# Patient Record
Sex: Female | Born: 1990 | Race: White | Hispanic: No | Marital: Single | Smoking: Current every day smoker
Health system: Southern US, Community
[De-identification: ages and names within clinical notes are randomized; demographics above are authoritative.]

## PROBLEM LIST (undated history)

## (undated) ENCOUNTER — Inpatient Hospital Stay (HOSPITAL_COMMUNITY): Payer: Self-pay

## (undated) DIAGNOSIS — F1910 Other psychoactive substance abuse, uncomplicated: Secondary | ICD-10-CM

## (undated) DIAGNOSIS — G5600 Carpal tunnel syndrome, unspecified upper limb: Secondary | ICD-10-CM

## (undated) DIAGNOSIS — M1990 Unspecified osteoarthritis, unspecified site: Secondary | ICD-10-CM

## (undated) DIAGNOSIS — F419 Anxiety disorder, unspecified: Secondary | ICD-10-CM

## (undated) DIAGNOSIS — R51 Headache: Secondary | ICD-10-CM

## (undated) HISTORY — DX: Carpal tunnel syndrome, unspecified upper limb: G56.00

---
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
## 2010-02-06 ENCOUNTER — Emergency Department (HOSPITAL_COMMUNITY): Admission: EM | Admit: 2010-02-06 | Discharge: 2010-02-06 | Payer: Self-pay | Admitting: Emergency Medicine

## 2010-06-02 ENCOUNTER — Emergency Department (HOSPITAL_COMMUNITY): Admission: EM | Admit: 2010-06-02 | Discharge: 2009-07-19 | Payer: Self-pay | Admitting: Emergency Medicine

## 2011-11-22 ENCOUNTER — Encounter (HOSPITAL_COMMUNITY): Payer: Self-pay

## 2011-11-22 ENCOUNTER — Inpatient Hospital Stay (HOSPITAL_COMMUNITY)
Admission: AD | Admit: 2011-11-22 | Discharge: 2011-11-22 | Disposition: A | Discharge status: Home / Self Care | Payer: Medicaid Other | Source: Ambulatory Visit | Attending: Obstetrics and Gynecology | Admitting: Obstetrics and Gynecology

## 2011-11-22 DIAGNOSIS — Z331 Pregnant state, incidental: Secondary | ICD-10-CM

## 2011-11-22 DIAGNOSIS — O99891 Other specified diseases and conditions complicating pregnancy: Secondary | ICD-10-CM | POA: Insufficient documentation

## 2011-11-22 HISTORY — DX: Headache: R51
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
## 2011-11-22 NOTE — MAU Note (Signed)
Patient states she has had no prenatal care pending Medicaid approval. Plans to go to [facility] for care. Has had some lower right abdominal pain that radiates into the back but none now. Denies any bleeding or discharge. Wants to make sure everything is OK.

## 2011-11-22 NOTE — MAU Provider Note (Signed)
  History     CSN: [id number]  Arrival date and time: 11/22/11 1429   First Provider Initiated Contact with Patient 11/22/11 1813      No chief complaint on file.  HPI21 yr female g1 P 0 at 78. 0 wks with recently acquired medicaid approval, awaiting card, desires care thru [facility].  Has not contacted their office. No c/o  But just wants to hear ht beat, know theres no problem.  Pertinent Gynecological History: Menses: regular every month with spotting approximately 4-5 days days per month Bleeding: none Contraception: none DES exposure: unknown Blood transfusions: none Sexually transmitted diseases: no past history Previous GYN Procedures: none, never had  a gyn exam or pap  Last mammogram: n/a Date:  Last pap:  Not done yet   Past Medical History  Diagnosis Date  . Headache     Past Surgical History  Procedure Date  . Foot surgery   . Wisdom tooth extraction     Family History  Problem Relation Age of Onset  . Anesthesia problems Neg Hx   . Hypotension Neg Hx   . Malignant hyperthermia Neg Hx   . Pseudochol deficiency Neg Hx     History  Substance Use Topics  . Smoking status: Current Everyday Smoker -- 1.0 packs/day for 4 years    Types: Cigarettes  . Smokeless tobacco: Never Used  . Alcohol Use: No    Allergies: No Known Allergies  Prescriptions prior to admission  Medication Sig Dispense Refill  . Prenatal Vit-Fe Fumarate-FA (PRENATAL MULTIVITAMIN) TABS Take 1 tablet by mouth every morning.        ROS Physical Exam   Blood pressure 116/67, pulse 87, temperature 98.3 F (36.8 C), temperature source Oral, resp. rate 16, height 5' 1.5" (1.562 m), weight 73.573 kg (162 lb 3.2 oz), last menstrual period 08/02/2011, SpO2 100.00%.  Physical Exam Physical Examination: General appearance - alert, well appearing, and in no distress Mental status - alert, oriented to person, place, and time Neck - supple, no significant adenopathy Chest - clear  to auscultation, no wheezes, rales or rhonchi, symmetric air entry, no tachypnea, retractions or cyanosis Heart - normal rate, regular rhythm, normal S1, S2, no murmurs, rubs, clicks or gallops, normal rate and regular rhythm Abdomen - soft, nontender, nondistended, no masses or organomegaly Gravid uterus at u-3 cm, Fht 150 regular Extremities - no pedal edema noted, Homan's sign negative bilaterally  MAU Course  Procedures  MDM minimal  Assessment and Plan  Pregnancy 16 wks,  FHt confirmed  Plan:  Referred to [facility] for initiation of prenatal care,  Pt aware that in the absence of any acute concerns, ultrasound deferred til seen by MD of choice.  Jullien Granquist V 11/22/2011, 6:21 PM

## 2011-11-22 NOTE — MAU Note (Signed)
Pt states has had h/a X1.5 hours, no food since yesterday evening. Denies abnormal vaginal d/c changes or bleeding, G1.

## 2011-12-01 ENCOUNTER — Other Ambulatory Visit: Payer: Self-pay | Admitting: Obstetrics

## 2011-12-01 DIAGNOSIS — Z3689 Encounter for other specified antenatal screening: Secondary | ICD-10-CM

## 2011-12-15 ENCOUNTER — Ambulatory Visit (HOSPITAL_COMMUNITY)
Admission: RE | Admit: 2011-12-15 | Discharge: 2011-12-15 | Disposition: A | Discharge status: Home / Self Care | Payer: Medicaid Other | Source: Ambulatory Visit | Attending: Obstetrics | Admitting: Obstetrics

## 2011-12-15 DIAGNOSIS — O358XX0 Maternal care for other (suspected) fetal abnormality and damage, not applicable or unspecified: Secondary | ICD-10-CM | POA: Insufficient documentation

## 2011-12-15 DIAGNOSIS — Z3689 Encounter for other specified antenatal screening: Secondary | ICD-10-CM

## 2011-12-15 DIAGNOSIS — Z363 Encounter for antenatal screening for malformations: Secondary | ICD-10-CM | POA: Insufficient documentation

## 2011-12-15 DIAGNOSIS — Z1389 Encounter for screening for other disorder: Secondary | ICD-10-CM | POA: Insufficient documentation

## 2011-12-15 DIAGNOSIS — O99330 Smoking (tobacco) complicating pregnancy, unspecified trimester: Secondary | ICD-10-CM | POA: Insufficient documentation

## 2011-12-15 IMAGING — US US OB DETAIL+14 WK
2 series · 16 of 28 positions shown · non-contrast
Comparison: none

[Series 1: us ob detail +14 wk · 2 of 12 slices shown (1 of 2)]
[im 1/12]
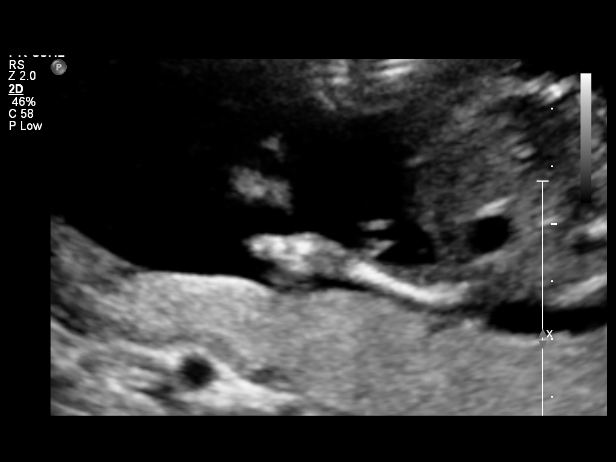
[im 8/12]
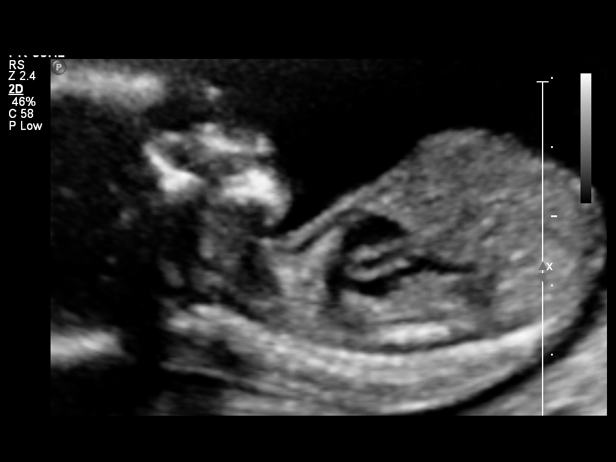

[Series 1: us ob detail +14 wk · 14 of 76 slices shown (2 of 2)]
[im 1/76]
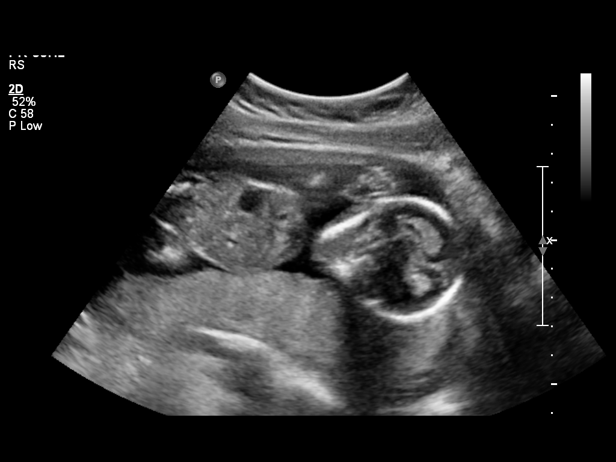
[im 7/76]
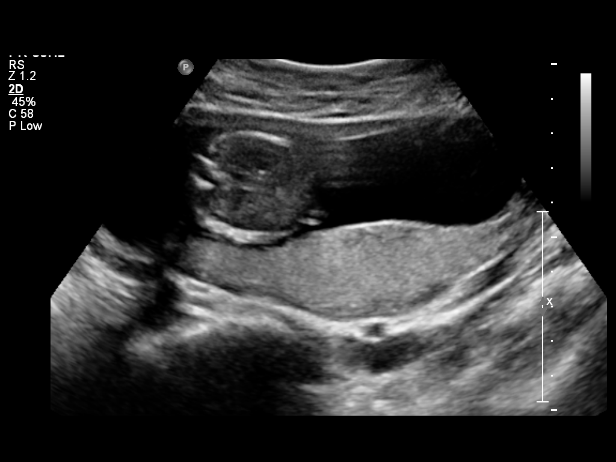
[im 10/76]
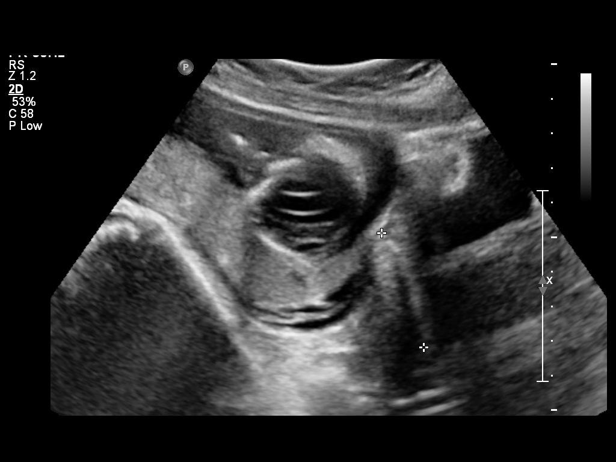
[im 17/76]
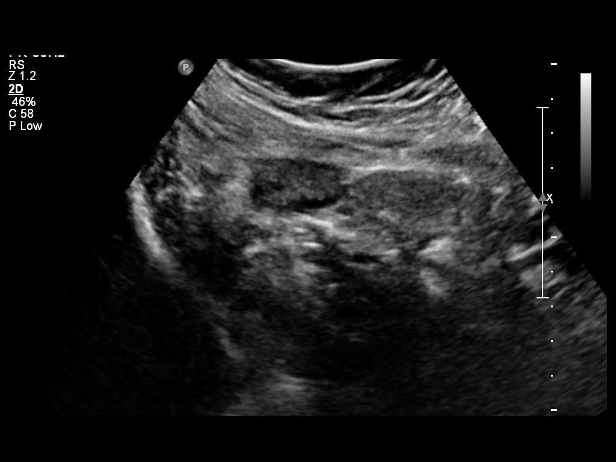
[im 23/76]
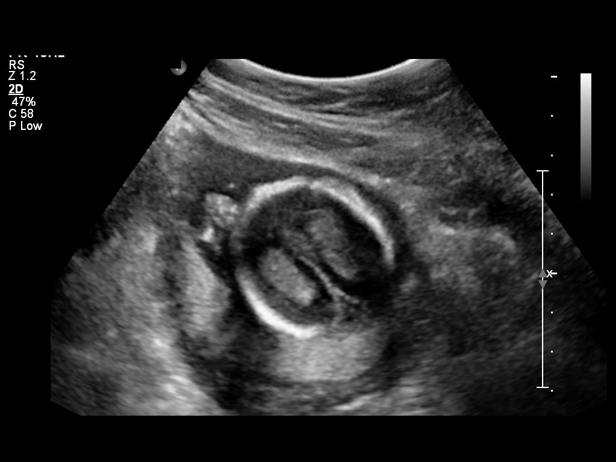
[im 30/76]
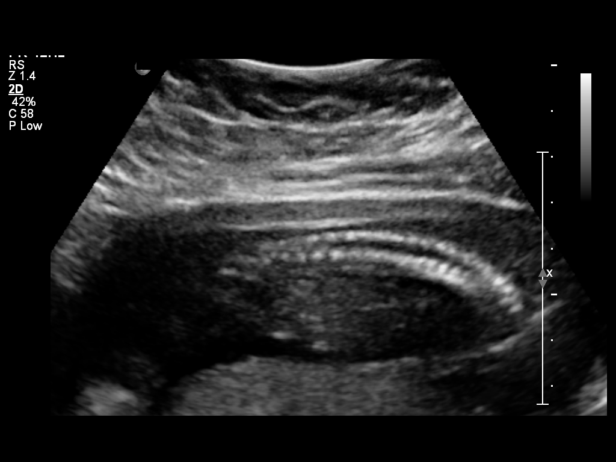
[im 33/76]
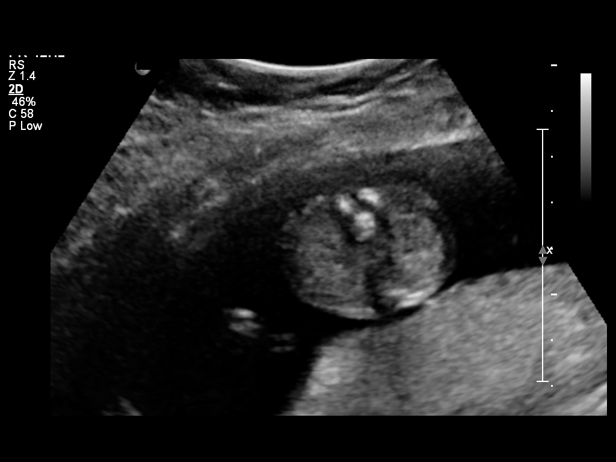
[im 40/76]
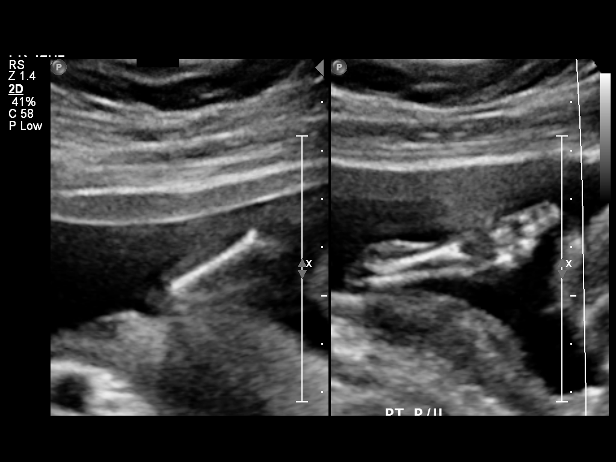
[im 46/76]
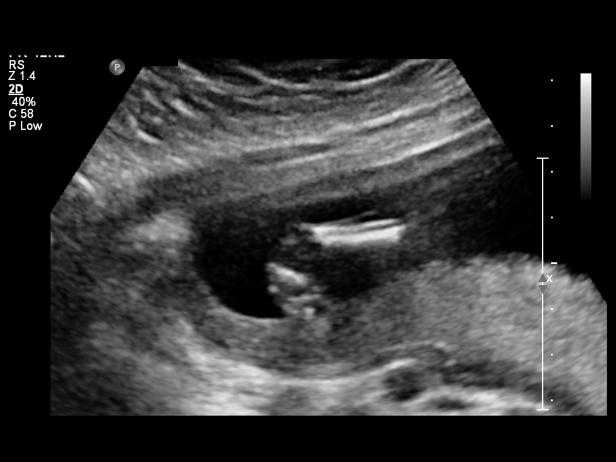
[im 53/76]
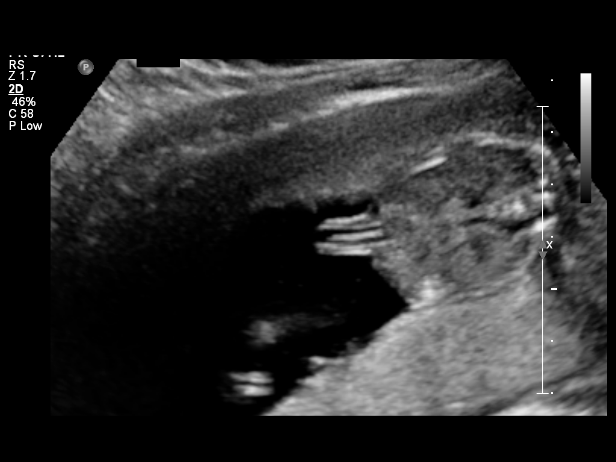
[im 56/76]
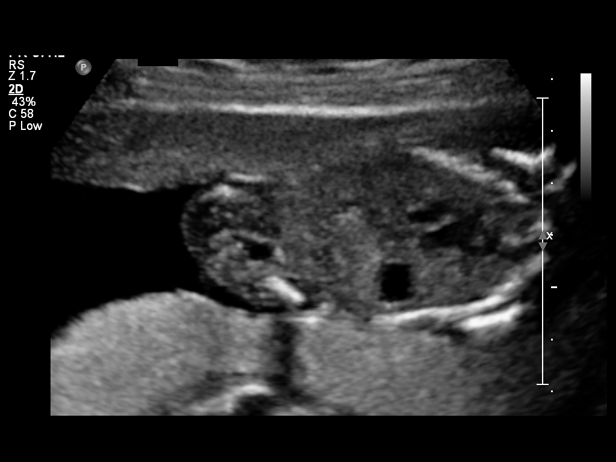
[im 62/76]
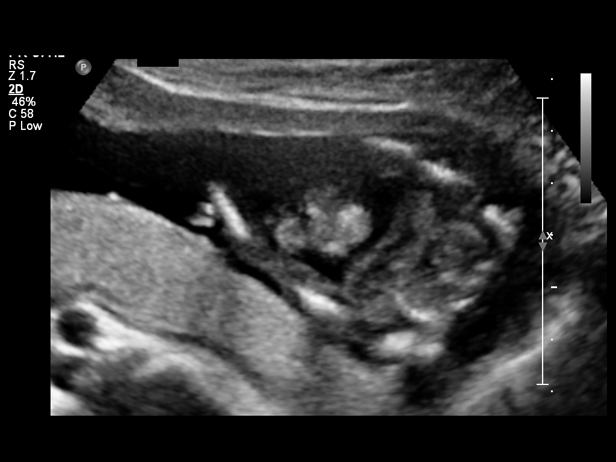
[im 69/76]
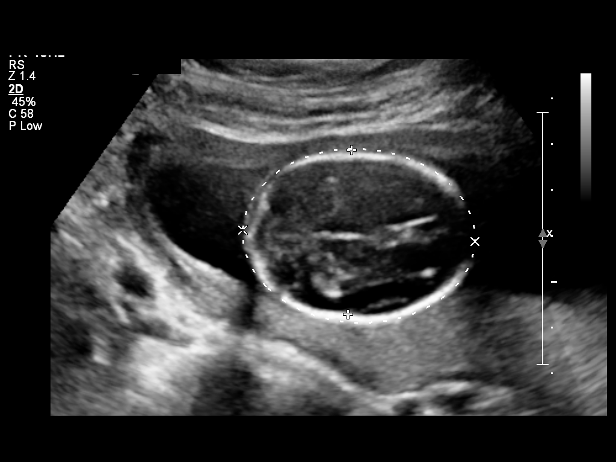
[im 76/76]
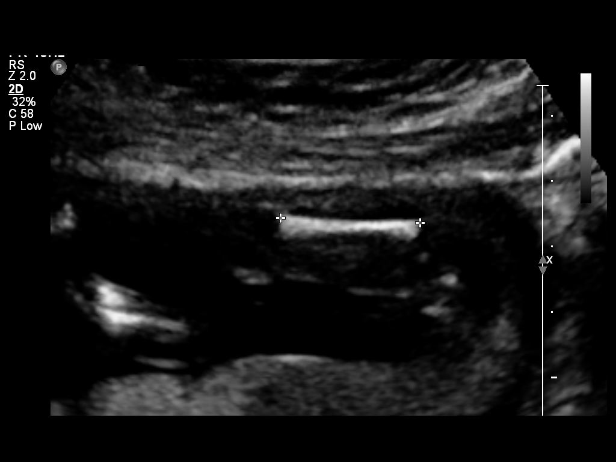

[16 of 28 positions shown; findings below may reference images not displayed]

OBSTETRICS REPORT
                      (Signed Final [DATE] [DATE])

 Order#:         [PHONE_NUMBER]_O
Procedures

 US OB DETAIL + 14 WK                                  76811.0
Indications

 Detailed fetal anatomic survey                        655.83 [9L]
 Cigarette smoker
Fetal Evaluation

 Fetal Heart Rate:  153                         bpm
 Cardiac Activity:  Observed
 Presentation:      Cephalic
 Placenta:          Posterior Previa
 P. Cord            Visualized
 Insertion:

 Amniotic Fluid
 AFI FV:      Subjectively within normal limits
                                             Larg Pckt:       4  cm
Biometry

 BPD:     36.4  mm    G. Age:   17w 1d                CI:        68.07   70 - 86
                                                      FL/HC:      15.0   14.6 -

 HC:     141.1  mm    G. Age:   17w 3d       55  %    HC/AC:      1.19   1.07 -

 AC:     118.6  mm    G. Age:   17w 4d       64  %    FL/BPD:
 FL:      21.2  mm    G. Age:   16w 3d       18  %    FL/AC:      17.9   20 - 24
 HUM:     21.1  mm    G. Age:   16w 2d       35  %
 CER:     16.4  mm    G. Age:   16w 3d       33  %
 NFT:     3.83  mm

 Est. FW:     178  gm      0 lb 6 oz     52  %
Gestational Age

 LMP:           19w 2d       Date:   [DATE]                 EDD:   [DATE]
 U/S Today:     17w 1d                                        EDD:   [DATE]
 Best:          17w 1d    Det. By:   U/S ([DATE])           EDD:   [DATE]
Anatomy

 Cranium:           Appears normal      Aortic Arch:       Appears normal
 Fetal Cavum:       Appears normal      Ductal Arch:       Appears normal
 Ventricles:        Appears normal      Diaphragm:         Appears normal
 Choroid Plexus:    Appears normal      Stomach:           Appears
                                                           normal, left
                                                           sided
 Cerebellum:        Appears normal      Abdomen:           Appears normal
 Posterior Fossa:   Appears normal      Abdominal Wall:    Appears nml
                                                           (cord insert,
                                                           abd wall)
 Nuchal Fold:       Appears normal      Cord Vessels:      Appears normal
                    (neck, nuchal                          (3 vessel cord)
                    fold)
 Face:              Unilateral right    Kidneys:           Appear normal
                    cleft lip with
                    probable palate
 Heart:             Appears normal      Bladder:           Appears normal
                    (4 chamber &
                    axis)
 RVOT:              Appears normal      Spine:             Limited views
                                                           appear normal
 LVOT:              Appears normal      Limbs:             Bilateral
                                                           clubbed feet

 Other:     Fetus appears to be a male. Left 5th digit visualized.
Targeted Anatomy

 Fetal Central Nervous System
 Lat. Ventricles:   6.6                 Cisterna Magna:
Cervix Uterus Adnexa

 Cervix:       Normal appearance by transabdominal scan.
 Left Ovary:   Within normal limits.
 Right Ovary:  Within normal limits.
 Adnexa:     No abnormality visualized.
Comments

 Because of today's findings, this report was called to the
 office.
Impression

 Siup demonstrating an EGA by ultrasound of 17w 1d. This is
 2w 1d behind expected EGA by provided LMP and suggests
 best dating by today's exam. Follow up to assess for
 appropriate linear growth is recommended in 3 weeks  to
 exclude early growth delay given this discrepancy and the
 anatomic findings.

 A right unilateral cleft lip is seen. The maxillary ridge appears
 disrupted suggesting associated involvement of the palate.
 Bilateral club foot deformity is seen. The remainder of the
 fetal anatomy appears normal. While both of these findings
 may occur sporadically, the presence of these two findings
 together and the possible delayed growth may indicate an
 underlying syndromic process and further assessment with
 genetic counseling and consideration for aneuploidy testing
 and fetal echo is recommended.  This can be performed at
 the Maternal Fetal Medicine department at [HOSPITAL].

 No focal placental abnormality is noted.

 Subjectively and quantitatively normal amniotic fluid volume.
 Normal cervical length.
Recommendations

 Further assessment with genetic counseling and
 consideration for aneuploidy testing.

 questions or concerns.

## 2011-12-18 ENCOUNTER — Other Ambulatory Visit: Payer: Self-pay | Admitting: Obstetrics

## 2011-12-18 DIAGNOSIS — R9389 Abnormal findings on diagnostic imaging of other specified body structures: Secondary | ICD-10-CM

## 2011-12-18 DIAGNOSIS — Q6601 Congenital talipes equinovarus, right foot: Secondary | ICD-10-CM

## 2011-12-20 ENCOUNTER — Ambulatory Visit (HOSPITAL_COMMUNITY): Payer: Medicaid Other | Attending: Obstetrics

## 2011-12-20 ENCOUNTER — Ambulatory Visit (HOSPITAL_COMMUNITY): Admission: RE | Admit: 2011-12-20 | Payer: Medicaid Other | Source: Ambulatory Visit

## 2011-12-22 ENCOUNTER — Other Ambulatory Visit: Payer: Self-pay | Admitting: Obstetrics

## 2011-12-22 ENCOUNTER — Ambulatory Visit (HOSPITAL_COMMUNITY)
Admission: RE | Admit: 2011-12-22 | Discharge: 2011-12-22 | Disposition: A | Discharge status: Home / Self Care | Payer: Medicaid Other | Source: Ambulatory Visit | Attending: Obstetrics | Admitting: Obstetrics

## 2011-12-22 VITALS — BP 106/62 | HR 83 | Wt 171.0 lb

## 2011-12-22 DIAGNOSIS — Q6601 Congenital talipes equinovarus, right foot: Secondary | ICD-10-CM

## 2011-12-22 DIAGNOSIS — Q6602 Congenital talipes equinovarus, left foot: Secondary | ICD-10-CM

## 2011-12-22 DIAGNOSIS — Q369 Cleft lip, unilateral: Secondary | ICD-10-CM

## 2011-12-22 DIAGNOSIS — Z3689 Encounter for other specified antenatal screening: Secondary | ICD-10-CM | POA: Insufficient documentation

## 2011-12-22 DIAGNOSIS — O26849 Uterine size-date discrepancy, unspecified trimester: Secondary | ICD-10-CM | POA: Insufficient documentation

## 2011-12-22 DIAGNOSIS — O358XX0 Maternal care for other (suspected) fetal abnormality and damage, not applicable or unspecified: Secondary | ICD-10-CM | POA: Insufficient documentation

## 2011-12-22 DIAGNOSIS — Q6689 Other  specified congenital deformities of feet: Secondary | ICD-10-CM

## 2011-12-22 IMAGING — US US OB FOLLOW-UP
1 series · 12 of 28 positions shown · non-contrast
Comparison: none

[Series 1: us ob follow-up · 0.19mm/px · 12 of 98 slices shown]
[im 4/98]
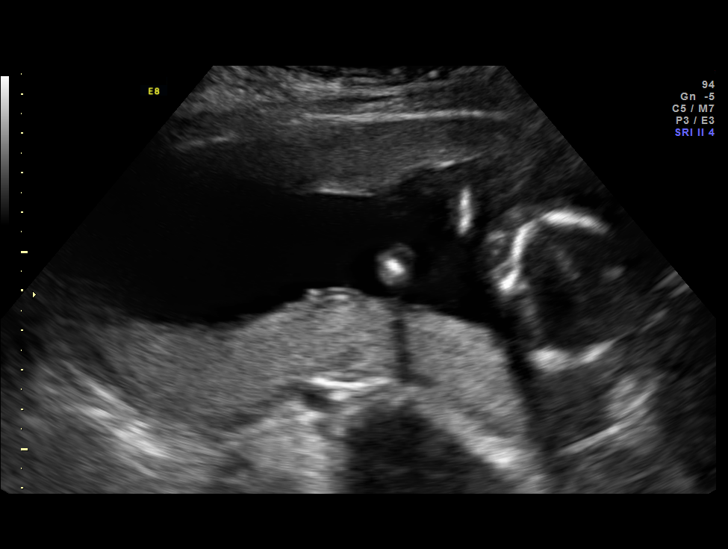
[im 11/98]
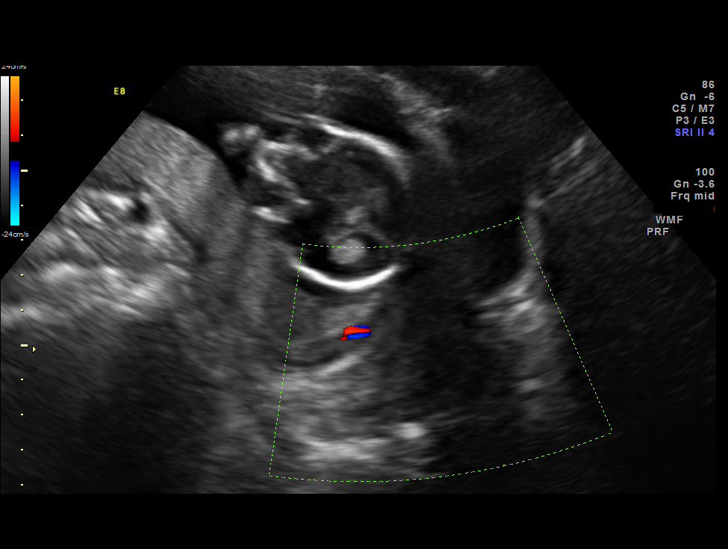
[im 18/98]
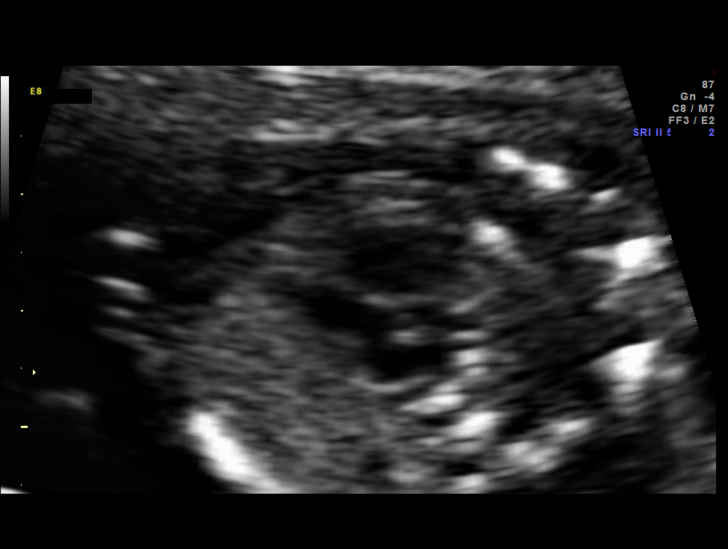
[im 29/98]
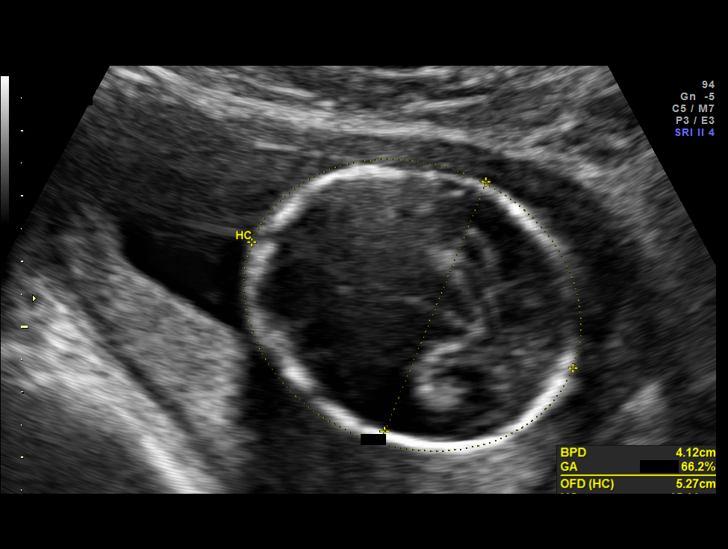
[im 36/98]
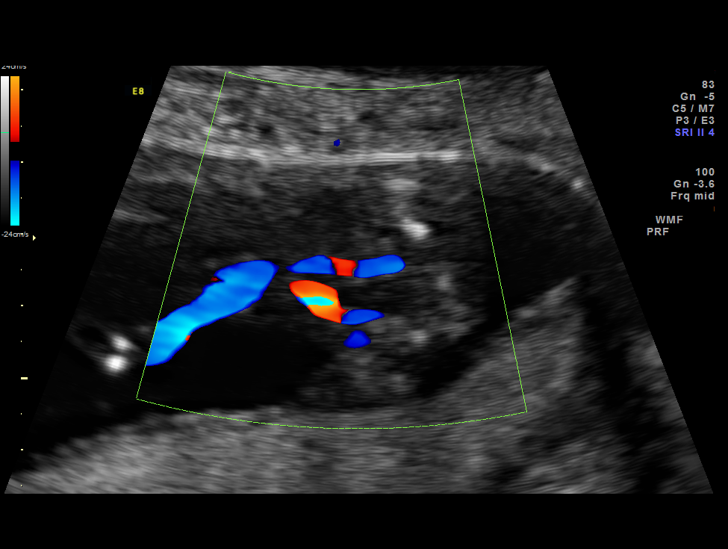
[im 44/98]
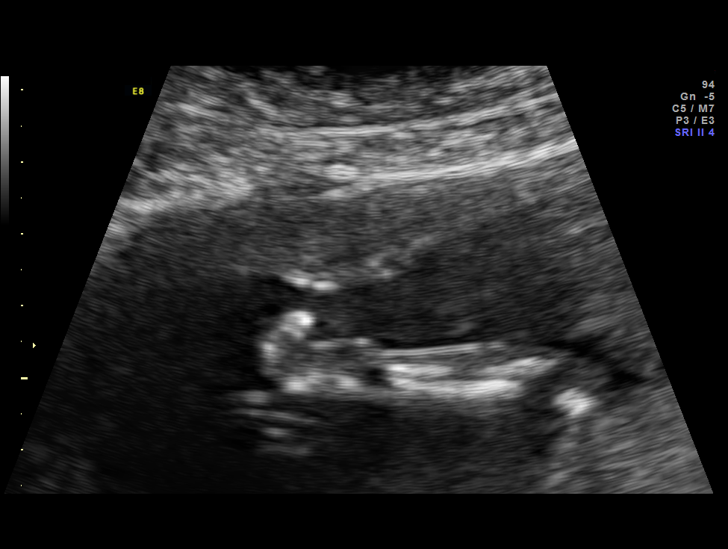
[im 54/98]
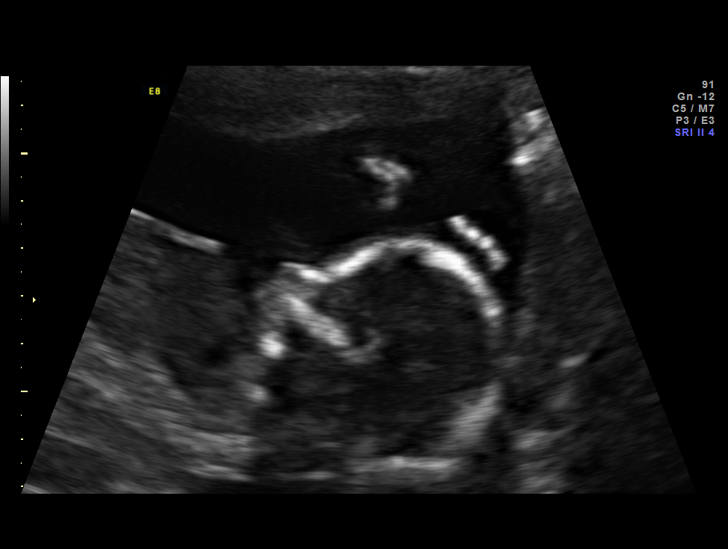
[im 62/98]
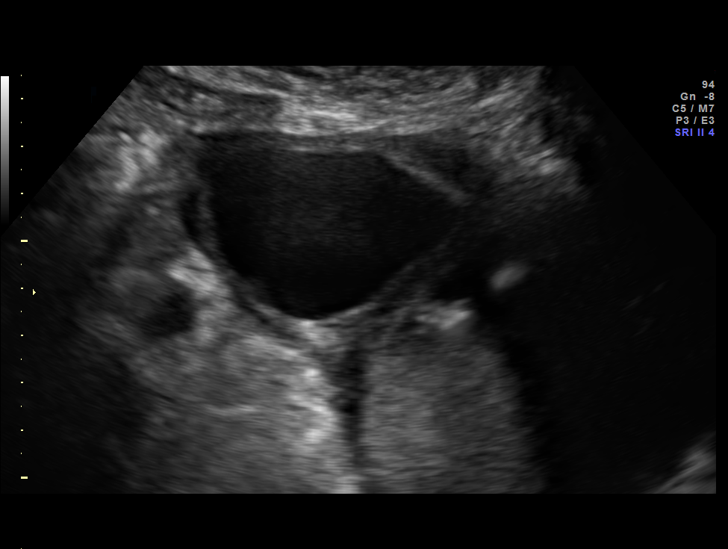
[im 69/98]
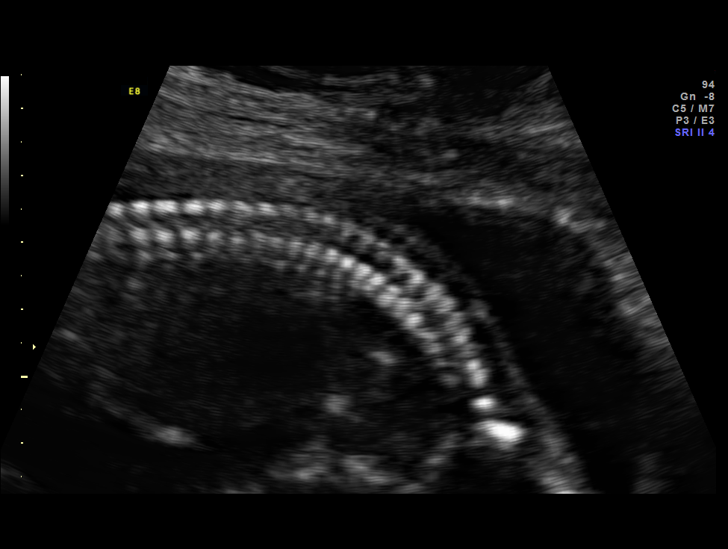
[im 80/98]
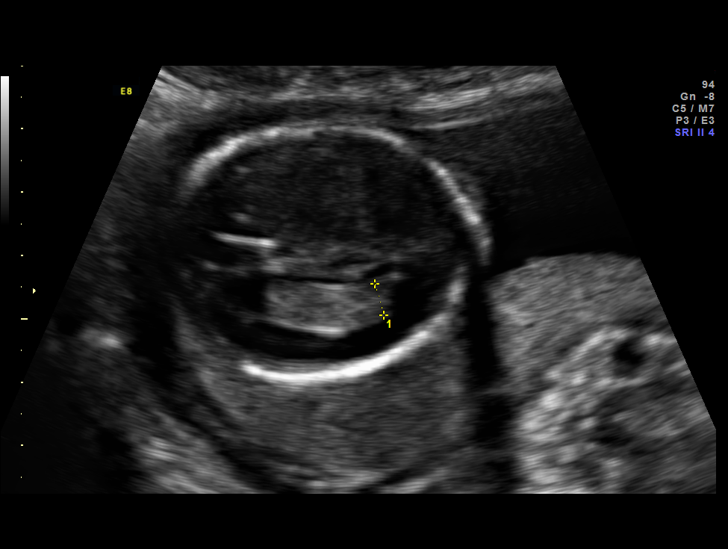
[im 87/98]
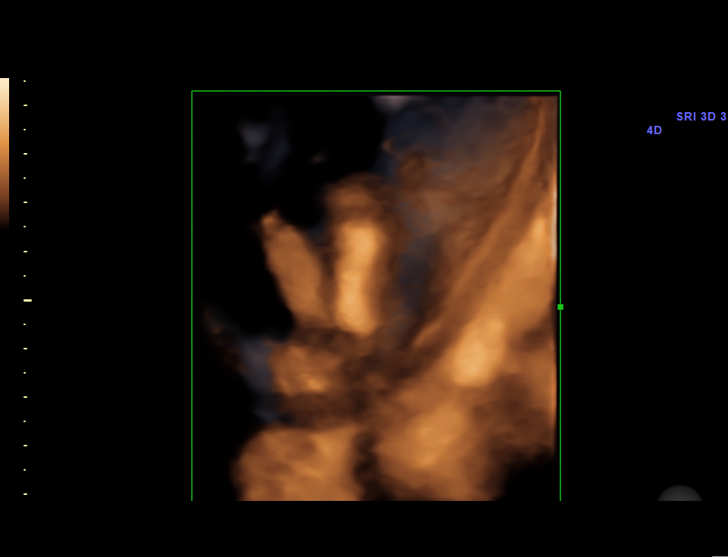
[im 94/98]
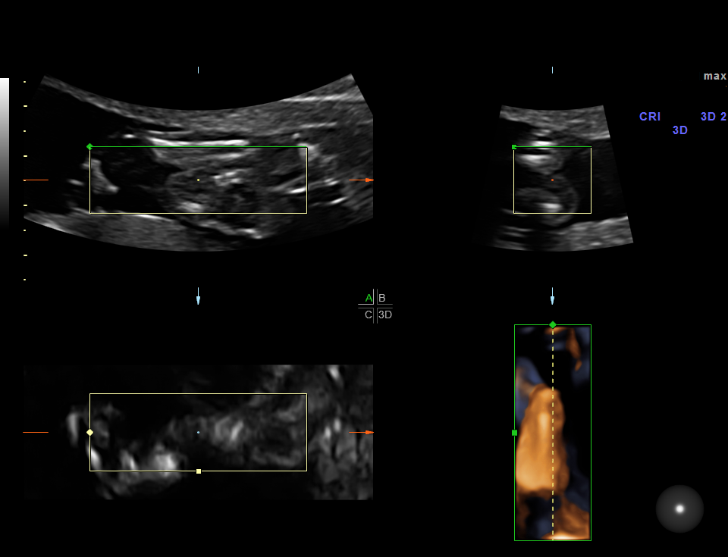

[12 of 28 positions shown; findings below may reference images not displayed]

OBSTETRICS REPORT
                      (Signed Final [DATE] [DATE])

 Order#:         [PHONE_NUMBER]_O
Procedures

 US OB FOLLOW UP                                       76816.1
Indications

 Cleft lip, unilateral
 Club feet
 Size-Date Discrepancy
 Assess Fetal Growth / Estimated Fetal Weight
Fetal Evaluation

 Fetal Heart Rate:  147                          bpm
 Cardiac Activity:  Observed
 Presentation:      Cephalic
 Placenta:          Posterior Previa
 P. Cord            Visualized, central
 Insertion:

 Amniotic Fluid
 AFI FV:      Subjectively within normal limits
                                             Larg Pckt:     4.0  cm
Biometry

 BPD:     40.8  mm     G. Age:  18w 2d                CI:         77.0   70 - 86
 OFD:       53  mm                                    FL/HC:      15.7   15.8 -
                                                                         18
 HC:     150.9  mm     G. Age:  18w 1d       42  %    HC/AC:      1.22   1.07 -

 AC:     123.5  mm     G. Age:  18w 0d       42  %    FL/BPD:
 FL:      23.7  mm     G. Age:  17w 1d       13  %    FL/AC:      19.2   20 - 24
 HUM:     24.5  mm     G. Age:  17w 5d       40  %
 CER:     18.8  mm     G. Age:  18w 3d       58  %
 NFT:      3.4  mm

  LEFT
 HUM:     24.5  mm     G. Age:  17w 5d       40  %
 FL:        24  mm     G. Age:  17w 1d       16  %
 ULN:     22.3  mm     G. Age:  17w 6d       37  %
 TIB:     20.3  mm     G. Age:  17w 1d       26  %
 RAD:     20.3  mm     G. Age:  17w 1d       38  %
 FIB:     20.9  mm     G. Age:  17w 0d       44  %
  RIGHT
 HUM:     24.6  mm     G. Age:  17w 5d       41  %
 FL:      23.4  mm     G. Age:  17w 0d       11  %
 ULN:     21.1  mm     G. Age:  17w 2d       24  %
 TIB:       20  mm     G. Age:  17w 0d       23  %
 RAD:     19.8  mm     G. Age:  16w 6d       35  %
 FIB:     19.9  mm     G. Age:  16w 4d       40  %

 Est. FW:     204  gm      0 lb 7 oz     38  %
Gestational Age

 LMP:           20w 2d        Date:  [DATE]                 EDD:   [DATE]
 U/S Today:     17w 6d                                        EDD:   [DATE]
 Best:          18w 1d     Det. By:  U/S ([DATE])           EDD:   [DATE]
Anatomy

 Cranium:           Appears normal      Aortic Arch:       Appears normal
 Fetal Cavum:       Appears normal      Ductal Arch:       Appears normal
 Ventricles:        Appears normal      Diaphragm:         Appears normal
 Choroid Plexus:    Appears normal      Stomach:           Appears
                                                           normal, left
                                                           sided
 Cerebellum:        Appears normal      Abdomen:           Appears normal
 Posterior Fossa:   Appears normal      Abdominal Wall:    Appears nml
                                                           (cord insert,
                                                           abd wall)
 Nuchal Fold:       Appears normal      Cord Vessels:      Appears normal
                    (neck, nuchal                          (3 vessel cord)
                    fold)
 Face:              Unilateral right    Kidneys:           Appear normal
                    cleft lip +
                    palate
 Heart:             Appears normal      Bladder:           Appears normal
                    (4 chamber &
                    axis)
 RVOT:              Appears normal      Spine:             Appears normal
 LVOT:              Appears normal      Limbs:             Bilateral
                                                           clubbed feet

 Other:     Fetus appears to be a male. Bilateral 5th digits and open
            hands visualized. Nasal bone visualized.
Targeted Anatomy

 Fetal Central Nervous System
 Cisterna Magna:
Cervix Uterus Adnexa

 Cervical Length:    3.1      cm

 Cervix:       Normal appearance by transabdominal scan.
Impression

 IUP at 18+1 weeks
 Unilateral cleft lip and palate; bilateral club feet
 All other detailed fetal anatomy was seen and appeared
 normal
 Markers of aneuploidy: none other than above
 Normal amniotic fluid volume
 Measurements consistent with prior US

 Ms. IMMANUEL received genetic counseling. Please see note. We
 reviewed that the US findings could be isolated structural
 anomalies or could be caused by a chromosomal
 abnormality, singe gene disorder or syndrome. She declined
 all prenatal testing today.
Recommendations

 Follow-up ultrasound in 4-6 weeks to reassess anatomy and
 growth
 Fetal ECHO scheduled

 questions or concerns.

## 2011-12-25 NOTE — Progress Notes (Signed)
Genetic Counseling  High-Risk Gestation Note  Appointment Date:  12/22/11 Referred By: Brock Bad, MD Date of Birth:  03-07-1991    Pregnancy History: G1P0 Estimated Date of Delivery: 05/23/12 Estimated Gestational Age: [redacted]w[redacted]d Attending: Particia Nearing, MD  I met with Sharon [name] and her partner, Birdie Riddle, for genetic counseling because of the previous ultrasound findings of bilateral club foot and unilateral cleft lip.  We began by reviewing the ultrasound in detail. Ultrasound today visualized the pregnancy to be 18 weeks and 1 day gestation. Bilateral club foot and right unilateral cleft lip and palate were visualized. Remaining visualized fetal anatomy appeared normal. Complete ultrasound results reported separately.   We discussed that clubfoot/feet is a term that actually describes three distinct anomalies (talipes equinovarus, talipes calcaneovalgus, and metatarsus varus) and occurs in 1 in 1000 births.  The most common type of clubfeet, talipes equinovarus, is characterized by forefoot adduction with supination, heel varus, and ankle equines, which cannot be brought back to a neutral position.  They were counseled that clubfeet can be an isolated difference, occur as a feature of an underlying syndrome, or the result of neurological impairment.  We discussed that the most likely mode of inheritance for nonsyndromic, isolated clubfoot/feet is multifactorial. There is a known genetic component for multifactorial clubfoot, as demonstrated by twin studies; environmental factors also play a role, including infection, drugs, and intrauterine environment (oligohydramnios, fetal positioning).  While the majority of cases of clubfoot/feet are isolated, it is a feature in more than 200 known genetic syndromes, including both chromosomal and single gene conditions.  Given that cleft lip and palate are also visualized on ultrasound, the clubfoot in the current pregnancy is not considered  isolated.  We also discussed the ultrasound finding of cleft lip and palate. In normal embryological development, the fetal lip usually closes by 7-[redacted] weeks gestation and the fetal palate usually closes by [redacted] weeks gestation.  When parts of these structures do not fuse properly, cleft lip and/or palate (CL/P) results.  CL/P is twice as common in males as it is in females. Approximately 25% (1 in 4) of all cleft lip and/or palate (CL/P) cases are cleft lip only, 50% (1 in 2) are cleft lip and palate, and 25% (1 in 4) are cleft palate only.  The incidence of CL/P varies in different ethnic populations; it occurs in approximately 1 in 1,000 Caucasian births and in approximately 1 in 68 [place] births.  In addition to ethnicity, other factors may increase the chance of a CL/P including some prenatal exposures, alcohol and drug use, cigarette smoking, or folic acid deficiency.  CL/P is most often an isolated condition, but can be present in combination with other birth defects possibly as part of a genetic syndrome. Approximately, 7-13% of individuals with a cleft lip and 11-14% of individuals with a cleft lip and palate are born with additional birth defects.  Many genetic syndromes are associated with cleft lip and/or palate which may not be identified on ultrasound and would not be detected by amniocentesis.  For this reason, a genetics evaluation may be recommended sometime after birth in order to assess for an underlying genetic syndrome.  When there is no syndrome as the cause, then the cleft lip or palate is typically suspected to be caused by a combination of genetic and environmental factors (multifactorial inheritance).   We discussed that the findings of club foot and cleft lip and palate could be separate occurrences. However, a single underlying  etiology is more likely to be suspected when multiple ultrasound findings are present. We reviewed chromosomes and genes, and examples of chromosome  conditions. We discussed the multiple etiologies for the findings seen by ultrasound including: a chromosome condition; a gene condition which could be recessive, dominant, or X-linked; a multifactorial cause; or a sporadic cause.  We also discussed the fact that we may not know the exact etiology until after the baby is born or maybe not until the baby grows and develops.  In addition, we discussed that a differential diagnosis may be made based on the ultrasound findings which may change over time, and thus, our differential may change as well.  The differential may change totally after birth when the baby can be fully examined.     We reviewed an available screening option, noninvasive prenatal testing (NIPT).  Specifically, we discussed that NIPT analyzes cell free fetal DNA found in the maternal circulation. This test is not diagnostic for chromosome conditions, but can provide information regarding the presence or absence of extra fetal DNA for chromosomes 13, 18, 21, X, and Y, and missing fetal DNA for chromosome X and Y (Turner syndrome). Thus, it would not identify or rule out all genetic conditions. The reported detection rate is greater than 99% for Trisomy 21, greater than 98% for Trisomy 18, and is approximately 80% (8 out of 10) for Trisomy 13. The false positive rate is reported to be less than 0.1% for any of these conditions. We discussed the option of amniocentesis, including the 1 in 300-500 risk for complications including spontaneous pregnancy loss.  They understand that amniocentesis allows evaluation of the fetal chromosomes, but cannot detect all genetic conditions.  Specifically, we discussed that single gene conditions are difficult to diagnose prenatally unless a specific condition is suspected based on additional ultrasound findings or family history. We discussed the risks, limitations, and benefits of each screening and testing option.   After thoughtful consideration, this couple  declined amniocentesis and NIPT at this time. Ms. Bills stated that she may consider pursuing NIPT but wanted to further consider this option and discuss with relatives.  The option of having their baby evaluated by a Medical Geneticist is also available so that a potential diagnosis for the baby and, ultimately, a recurrence risk for this couple can be made.  Without further information, we may not be able to answer questions related to why this happened or the recurrence risk for future pregnancies.    Given that the findings of clubfoot/feet and cleft lip +/- palate are associated with an increased risk for additional structural anomalies, we discussed the option of a fetal echocardiogram.  Additionally, we discussed the option of meeting with a pediatric orthopedic specialist to discuss expectant management and treatment of clubfeet as well as a prenatal consult with pediatric plastic and reconstructive surgery to discuss management and treatment of cleft lip and palate. We discussed that there are physicians from [facility] who provide consultative services in [place]. The couple expressed interest in these consultations. We will assist in arranging these consultations for the patient. A follow-up ultrasound was scheduled for 02/01/12 and fetal echocardiogram was also scheduled in 4-6 weeks.   Both family histories were reviewed and found to be contributory for cerebral palsy in the father of the pregnancy's sister. She was born premature and reportedly is unable to walk. The cerebral palsy is attributed to preterm labor and delivery. She is currently 21 years old and otherwise healthy.  This family history is not thought to increase the chance for cerebral palsy in the current pregnancy, assuming that this relative's diagnosis is correct and especially when a cause is highly suspected.   Additionally, Ms. Wolfgang reported a female paternal first cousin once removed with  developmental delay. She was reportedly a twin gestation, but the twin fetus miscarried during the pregnancy. She is currently 25 months old, has seizures, is not able to hold up her head, and does not focus. An underlying cause is not known. She is currently being evaluated for possible etiology. We discussed that there are many different causes of developmental delay such as genetic differences, sporadic causes, or injuries. Without more specific information regarding an underlying diagnosis, potential genetic risks for relatives cannot be determined.  The patient was advised to call if she is able to find out more information regarding a specific diagnosis. Without further information regarding the provided family history, an accurate genetic risk cannot be calculated. Further genetic counseling is warranted if more information is obtained.  Ms. BETTIE CAPISTRAN was provided with written information regarding cystic fibrosis (CF) including the carrier frequency and incidence in the Caucasian and African American populations, the availability of carrier testing and prenatal diagnosis if indicated.  In addition, we discussed that CF is routinely screened for as part of the [place] newborn screening panel.  She declined testing today.   Ms. Wolven denied exposure to environmental toxins or chemical agents. She denied the use of alcohol or street drugs. She reported smoking approximately a half pack of cigarettes per day. The associations of smoking in pregnancy were reviewed and cessation encouraged. She denied significant viral illnesses during the course of her pregnancy. Her medical and surgical histories were noncontributory.   I counseled this couple regarding the above risks and available options.  The approximate face-to-face time with the genetic counselor was 30 minutes.  Quinn Plowman, MS Certified Genetic Counselor 12/25/2011

## 2012-02-01 ENCOUNTER — Ambulatory Visit (HOSPITAL_COMMUNITY)
Admission: RE | Admit: 2012-02-01 | Discharge: 2012-02-01 | Disposition: A | Discharge status: Home / Self Care | Payer: Medicaid Other | Source: Ambulatory Visit | Attending: Obstetrics | Admitting: Obstetrics

## 2012-02-01 VITALS — BP 121/69 | HR 93 | Wt 175.0 lb

## 2012-02-01 DIAGNOSIS — O358XX0 Maternal care for other (suspected) fetal abnormality and damage, not applicable or unspecified: Secondary | ICD-10-CM | POA: Insufficient documentation

## 2012-02-01 DIAGNOSIS — O99330 Smoking (tobacco) complicating pregnancy, unspecified trimester: Secondary | ICD-10-CM | POA: Insufficient documentation

## 2012-02-01 DIAGNOSIS — Q369 Cleft lip, unilateral: Secondary | ICD-10-CM

## 2012-02-01 DIAGNOSIS — Q6601 Congenital talipes equinovarus, right foot: Secondary | ICD-10-CM

## 2012-02-01 IMAGING — US US OB FOLLOW-UP
1 series · 12 of 28 positions shown · non-contrast
Comparison: none

[Series 1: us ob follow-up · 0.21mm/px · 12 of 52 slices shown]
[im 2/52]
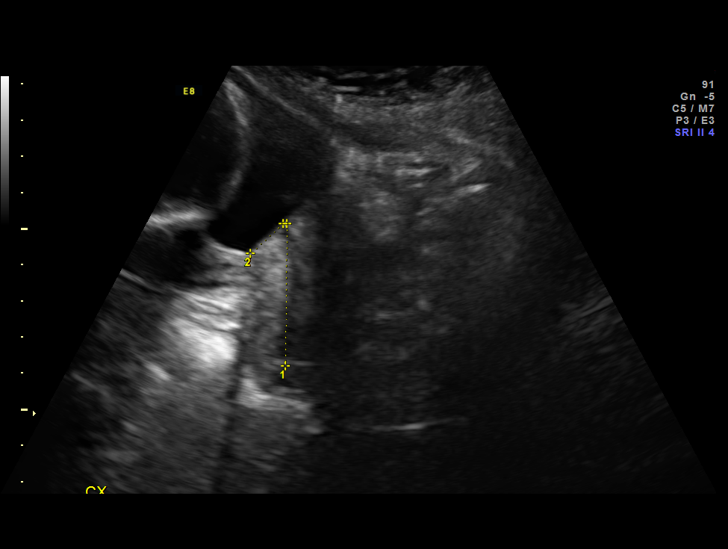
[im 6/52]
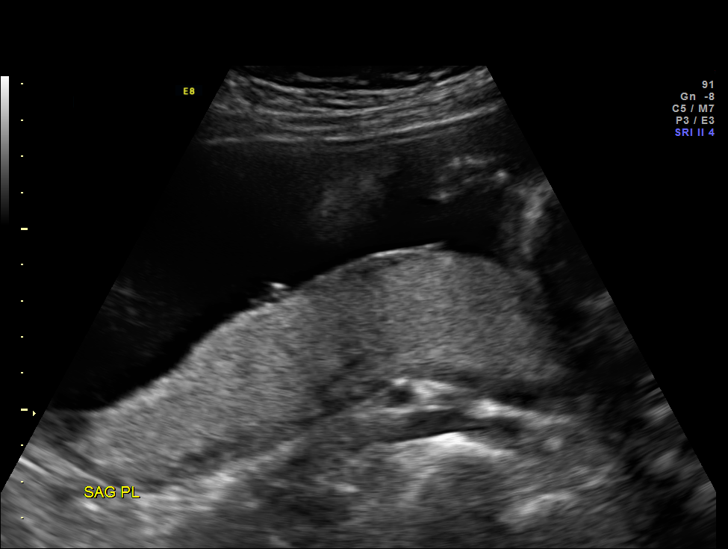
[im 10/52]
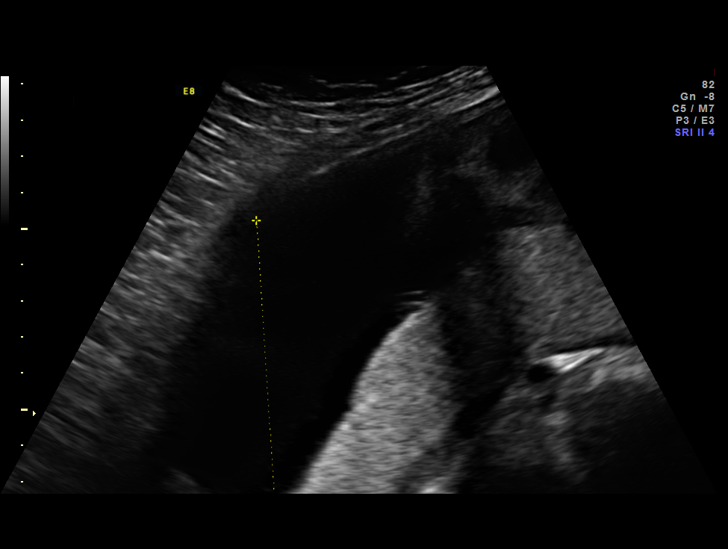
[im 16/52]
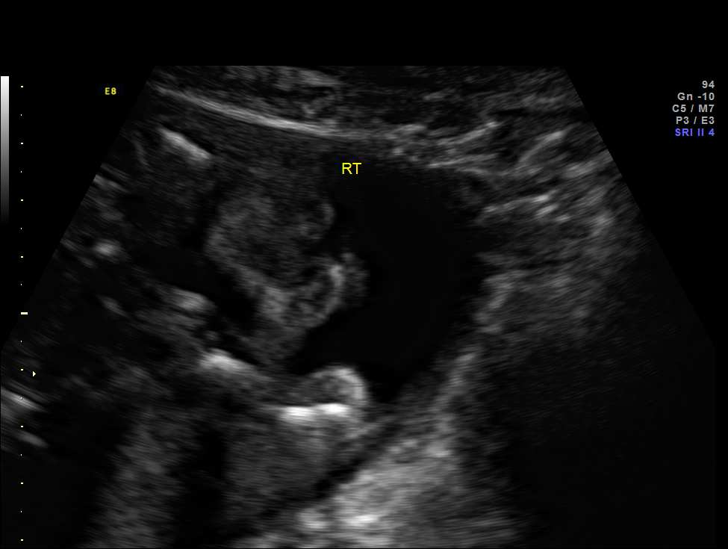
[im 19/52]
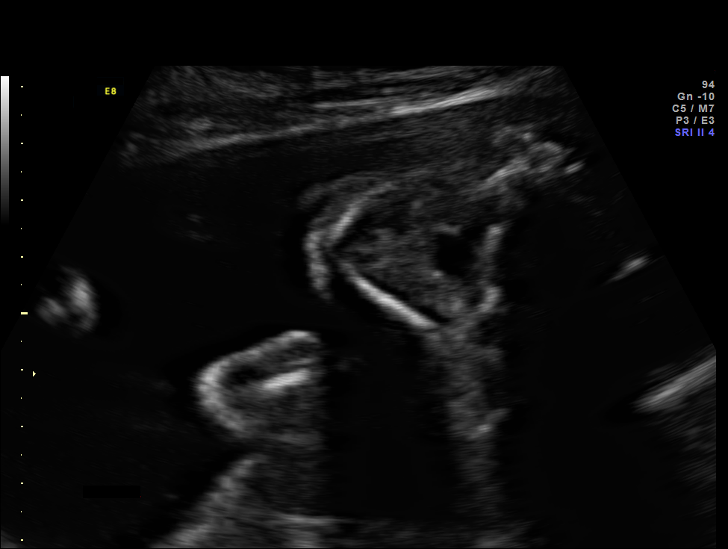
[im 23/52]
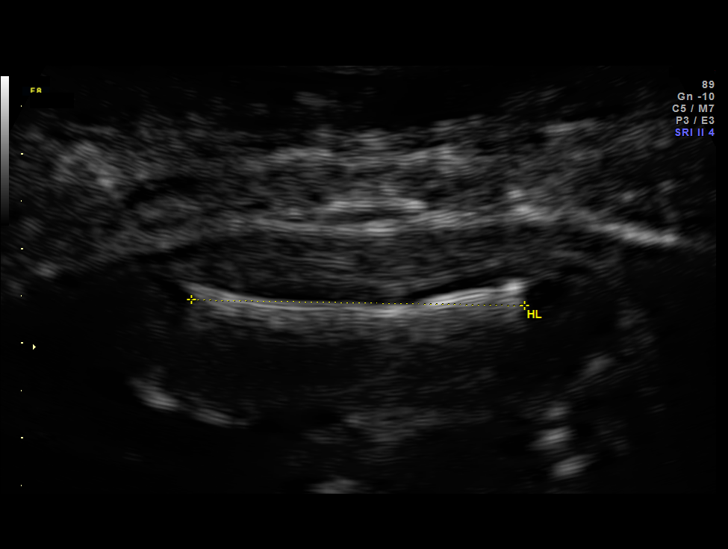
[im 29/52]
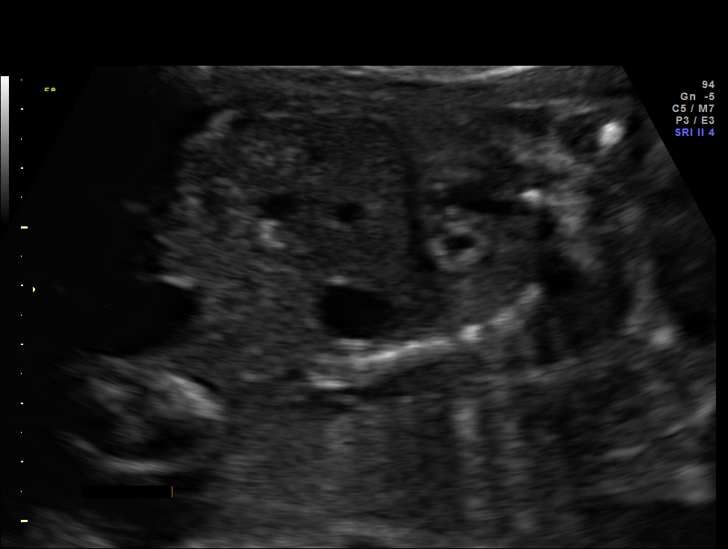
[im 33/52]
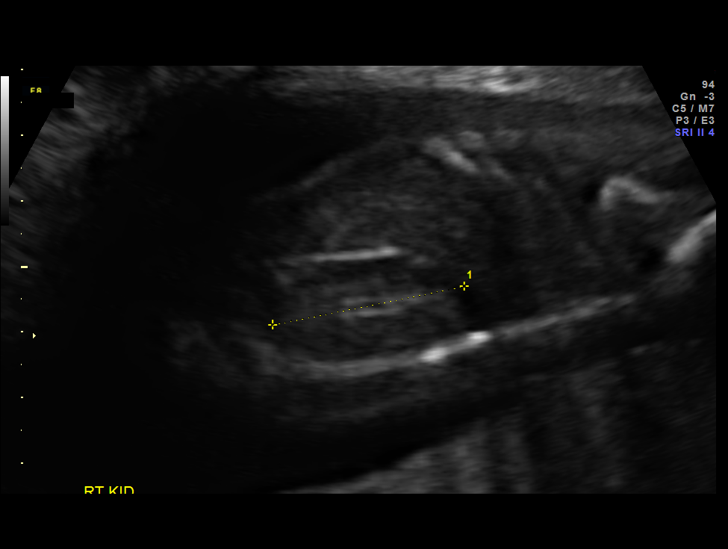
[im 36/52]
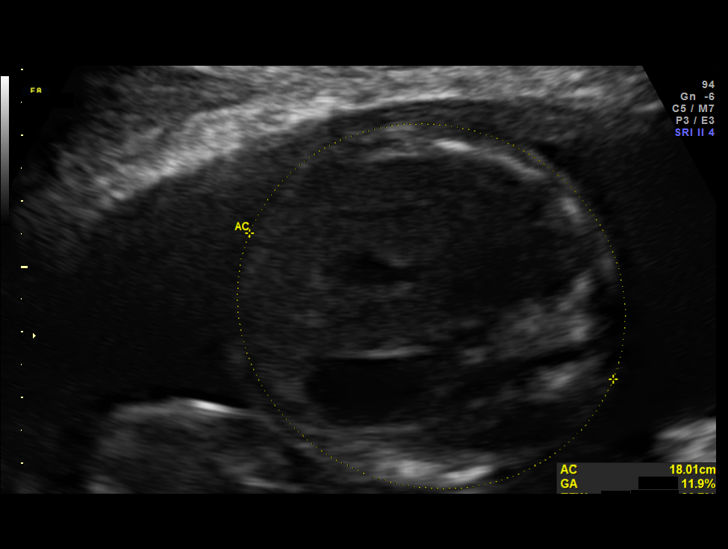
[im 42/52]
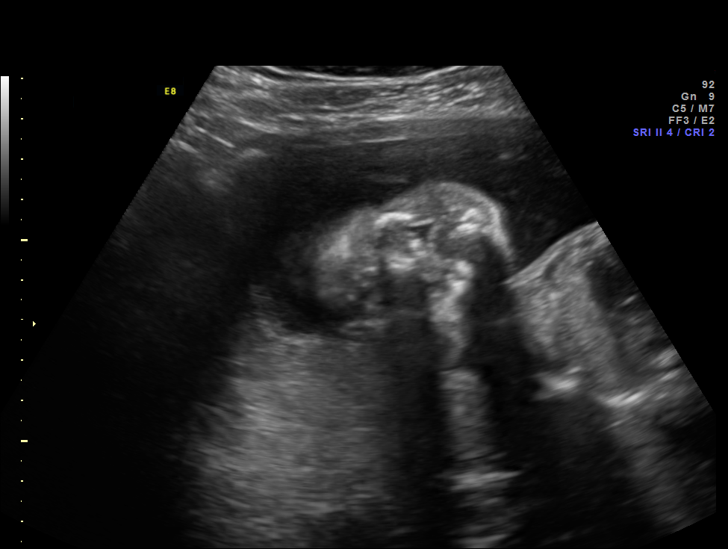
[im 46/52]
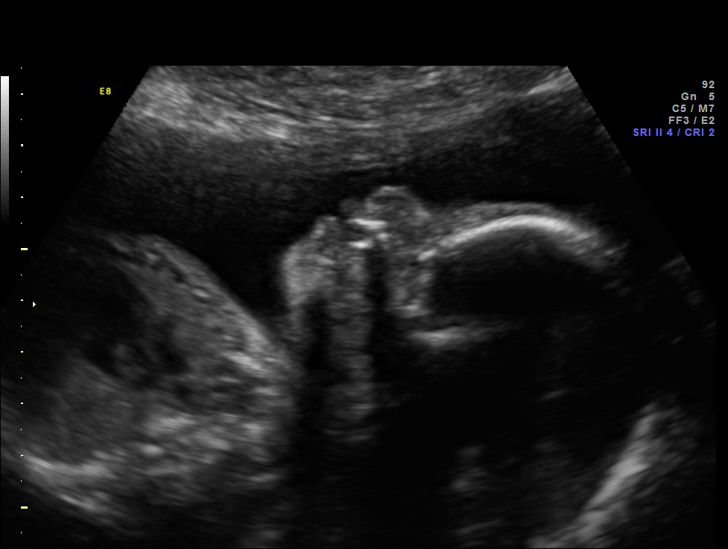
[im 50/52]
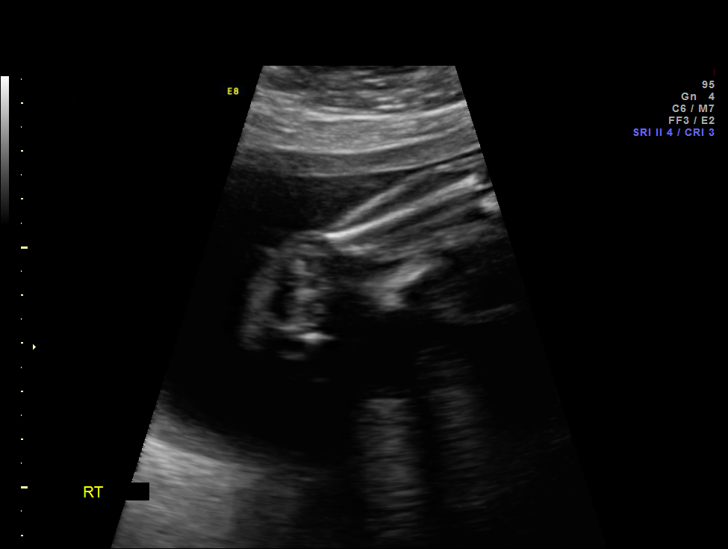

[12 of 28 positions shown; findings below may reference images not displayed]

OBSTETRICS REPORT
                      (Signed Final [DATE] [DATE])

 Order#:         [PHONE_NUMBER]_O
Procedures

 US OB FOLLOW UP                                       76816.1
Indications

 Cleft lip, unilateral
 Club feet
 Assess Fetal Growth / Estimated Fetal Weight
 Cigarette smoker
Fetal Evaluation

 Fetal Heart Rate:  152                          bpm
 Cardiac Activity:  Observed
 Presentation:      Cephalic
 Placenta:          Posterior low-lying,
                    cm from int os
 P. Cord            Previously Visualized
 Insertion:

 Amniotic Fluid
 AFI FV:      Subjectively within normal limits
                                             Larg Pckt:     7.7  cm
Biometry

 BPD:     54.3  mm     G. Age:  22w 4d                CI:         68.7   70 - 86
 OFD:       79  mm                                    FL/HC:      18.1   18.7 -

 HC:     214.9  mm     G. Age:  23w 4d       18  %    HC/AC:      1.19   1.05 -

 AC:     181.1  mm     G. Age:  22w 6d       14  %    FL/BPD:     71.6   71 - 87
 FL:      38.9  mm     G. Age:  22w 3d        6  %    FL/AC:      21.5   20 - 24
 HUM:     35.5  mm     G. Age:  22w 2d      < 5  %

 Est. FW:     537  gm      1 lb 3 oz     27  %
Gestational Age

 LMP:           26w 1d        Date:  [DATE]                 EDD:   [DATE]
 U/S Today:     22w 6d                                        EDD:   [DATE]
 Best:          24w 0d     Det. By:  U/S    ([DATE])        EDD:   [DATE]
Anatomy

 Cranium:           Appears normal      Aortic Arch:       Previously seen
 Fetal Cavum:       Previously seen     Ductal Arch:       Previously seen
 Ventricles:        Appears normal      Diaphragm:         Appears normal
 Choroid Plexus:    Previously seen     Stomach:           Appears
                                                           normal, left
                                                           sided
 Cerebellum:        Previously seen     Abdomen:           Previously seen
 Posterior Fossa:   Previously seen     Abdominal Wall:    Previously seen
 Nuchal Fold:       Previously seen     Cord Vessels:      Previously seen
 Face:              Unilateral right    Kidneys:           Appear normal
                    cleft lip +
                    palate
 Heart:             Previously seen     Bladder:           Appears normal
 RVOT:              Previously seen     Spine:             Previously seen
 LVOT:              Previously seen     Limbs:             Bilateral
                                                           clubbed feet

 Other:     Male gender. Heels and 5th digit previously seen.
Cervix Uterus Adnexa

 Cervical Length:    3.9      cm
Impression

 IUP at 24+0 weeks
 Right cleft lip and palate; bilateral club feet
 All other interval fetal anatomy was seen and appeared
 normal
 Normal amniotic fluid volume
 EFW at the 27th %tile; AC at the 14th %tile
 Benign synechia in upper uterus
 Low-lying posterior placenta

 ECHO today: mild tricuspid insufficiency
Recommendations

 Will make appts for plastic surgery and orthopedics
 Follow-up ultrasound for growth in 3 weeks
 Desires cffDNA screening at next appt

 questions or concerns.

## 2012-02-22 ENCOUNTER — Ambulatory Visit (HOSPITAL_COMMUNITY)
Admission: RE | Admit: 2012-02-22 | Discharge: 2012-02-22 | Disposition: A | Discharge status: Home / Self Care | Payer: Medicaid Other | Source: Ambulatory Visit | Attending: Obstetrics | Admitting: Obstetrics

## 2012-02-22 ENCOUNTER — Other Ambulatory Visit (HOSPITAL_COMMUNITY): Payer: Self-pay | Admitting: Maternal and Fetal Medicine

## 2012-02-22 ENCOUNTER — Other Ambulatory Visit: Payer: Self-pay

## 2012-02-22 ENCOUNTER — Encounter (HOSPITAL_COMMUNITY): Payer: Self-pay

## 2012-02-22 DIAGNOSIS — O99330 Smoking (tobacco) complicating pregnancy, unspecified trimester: Secondary | ICD-10-CM | POA: Insufficient documentation

## 2012-02-22 DIAGNOSIS — Q369 Cleft lip, unilateral: Secondary | ICD-10-CM

## 2012-02-22 DIAGNOSIS — O358XX0 Maternal care for other (suspected) fetal abnormality and damage, not applicable or unspecified: Secondary | ICD-10-CM | POA: Insufficient documentation

## 2012-02-22 DIAGNOSIS — Q6602 Congenital talipes equinovarus, left foot: Secondary | ICD-10-CM

## 2012-02-22 DIAGNOSIS — O4400 Placenta previa specified as without hemorrhage, unspecified trimester: Secondary | ICD-10-CM | POA: Insufficient documentation

## 2012-02-22 DIAGNOSIS — Q6601 Congenital talipes equinovarus, right foot: Secondary | ICD-10-CM

## 2012-02-22 IMAGING — US US OB FOLLOW-UP
1 series · 12 of 28 positions shown · non-contrast
Comparison: none

[Series 1: us ob follow-up · 12 of 53 slices shown]
[im 2/53]
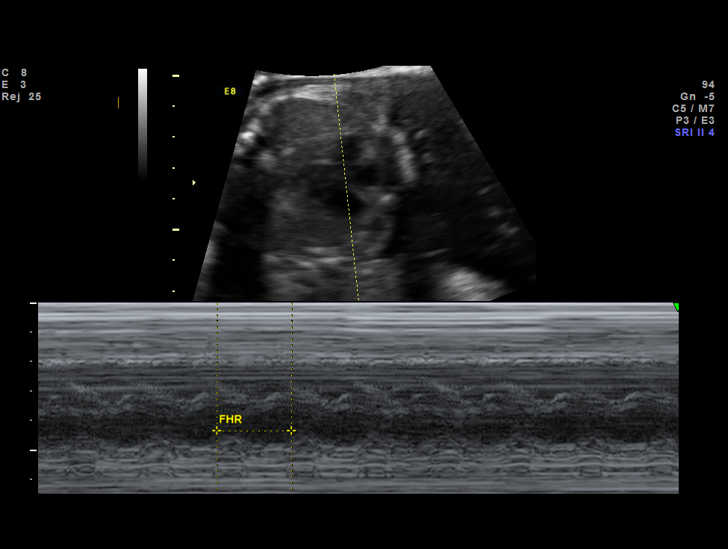
[im 6/53]
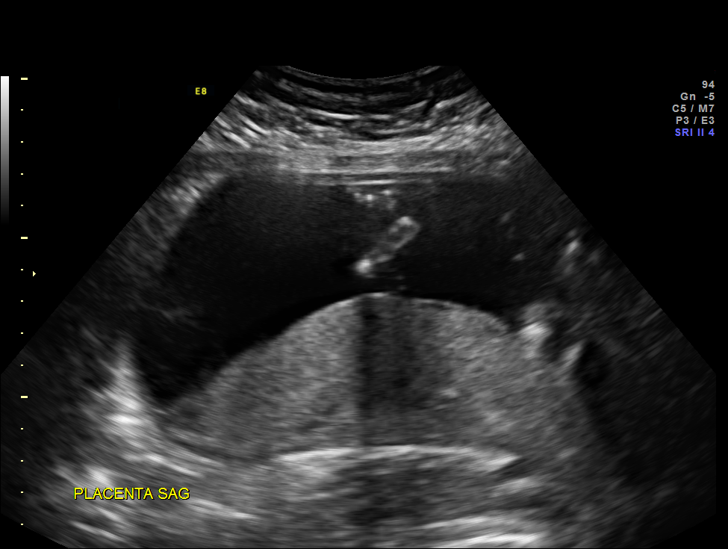
[im 10/53]
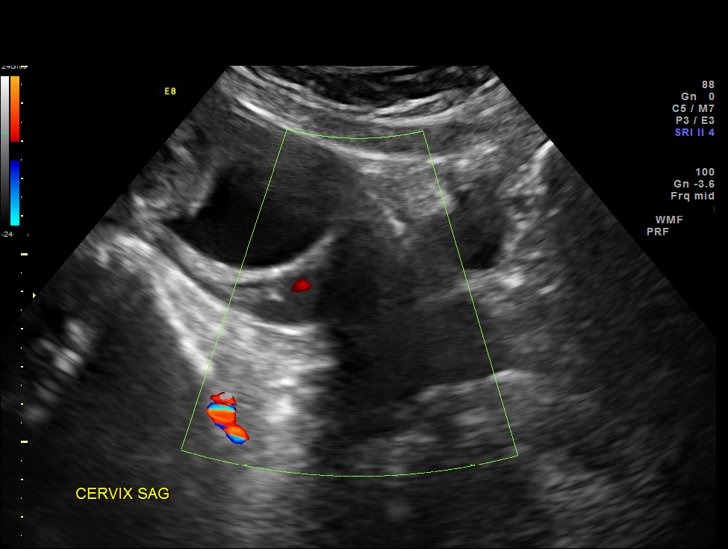
[im 16/53]
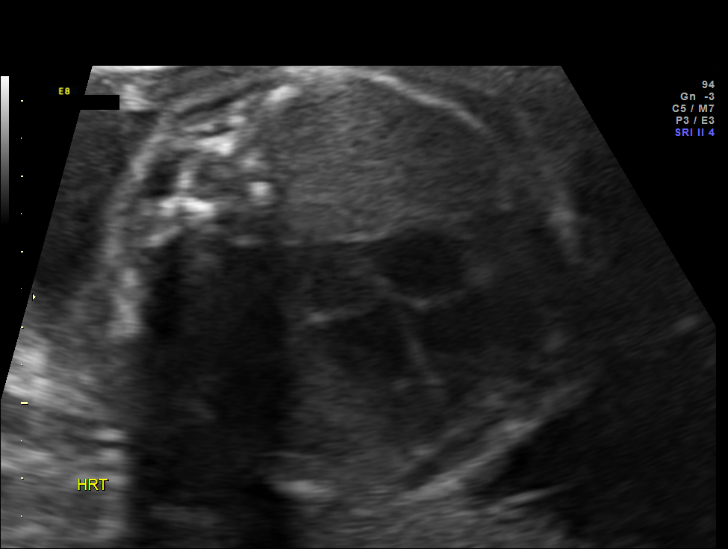
[im 20/53]
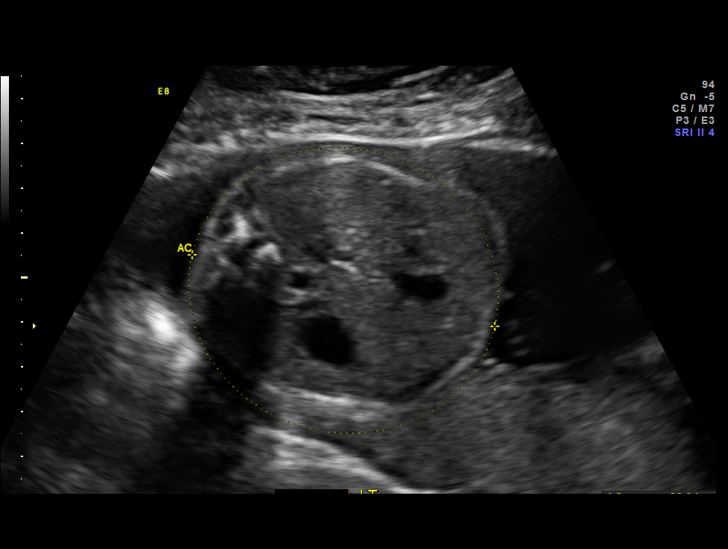
[im 24/53]
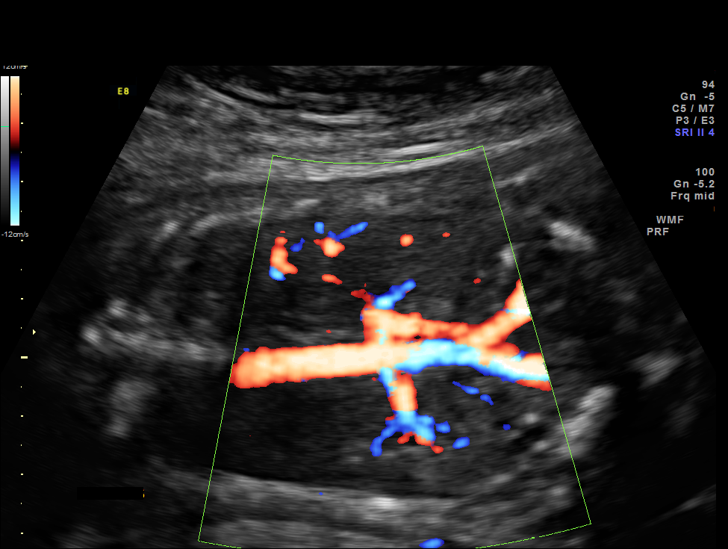
[im 29/53]
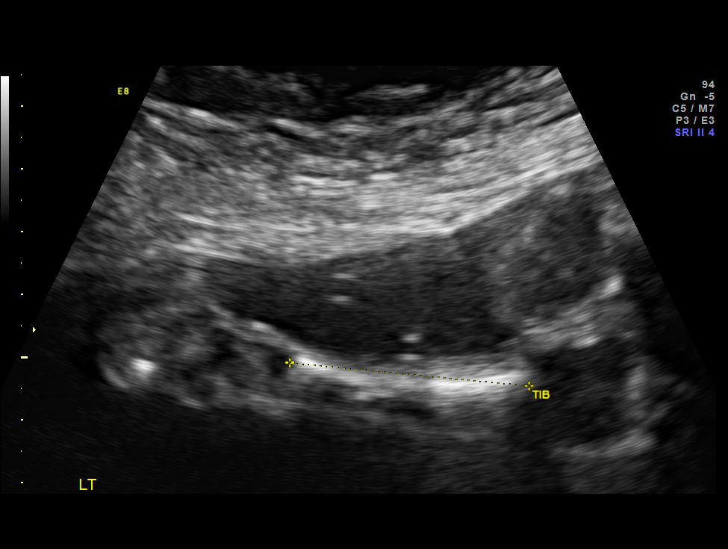
[im 33/53]
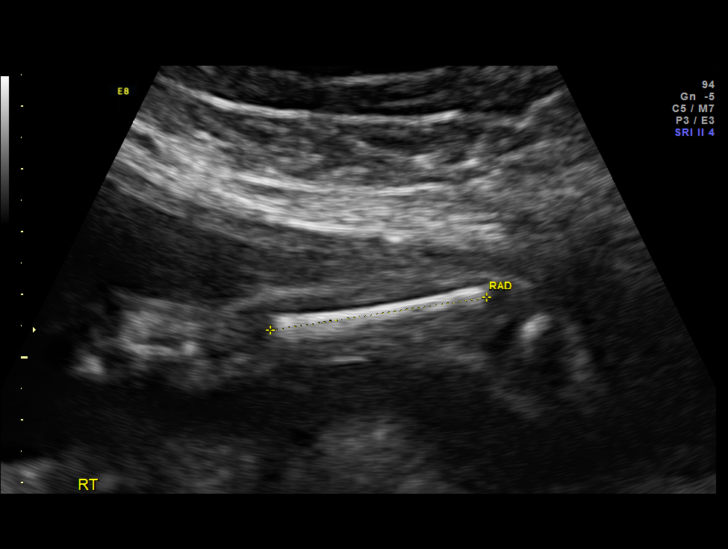
[im 37/53]
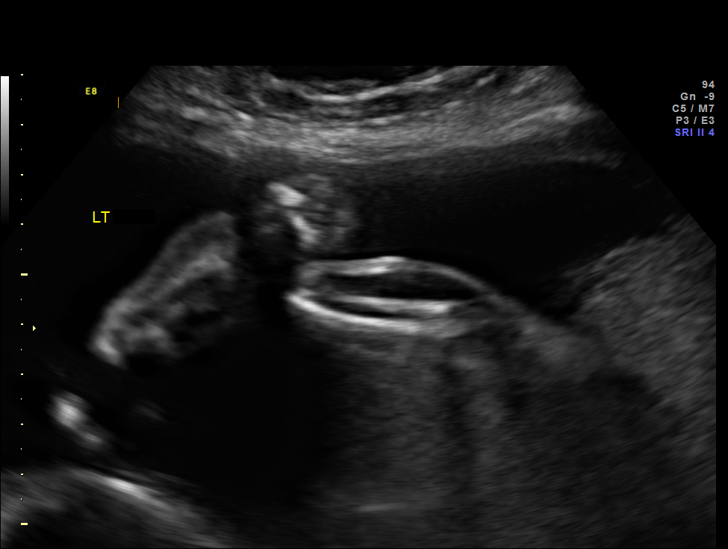
[im 43/53]
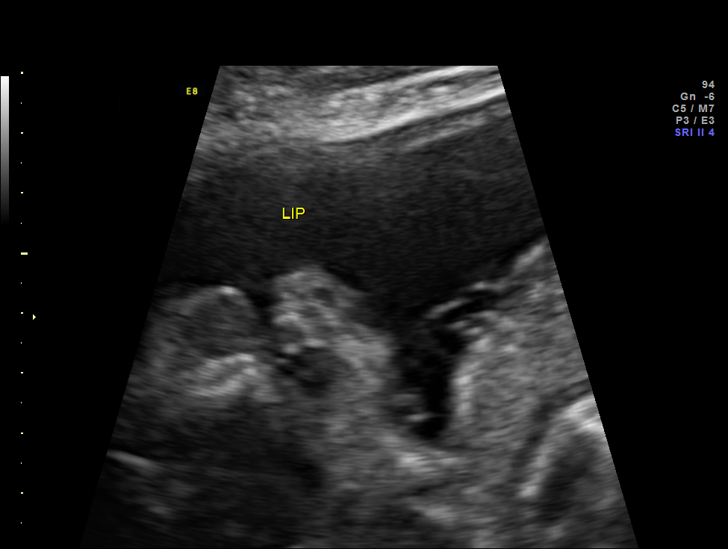
[im 47/53]
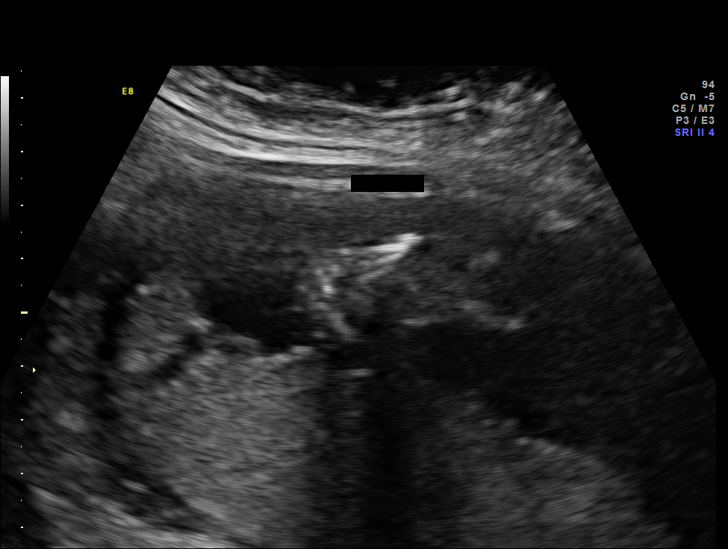
[im 51/53]
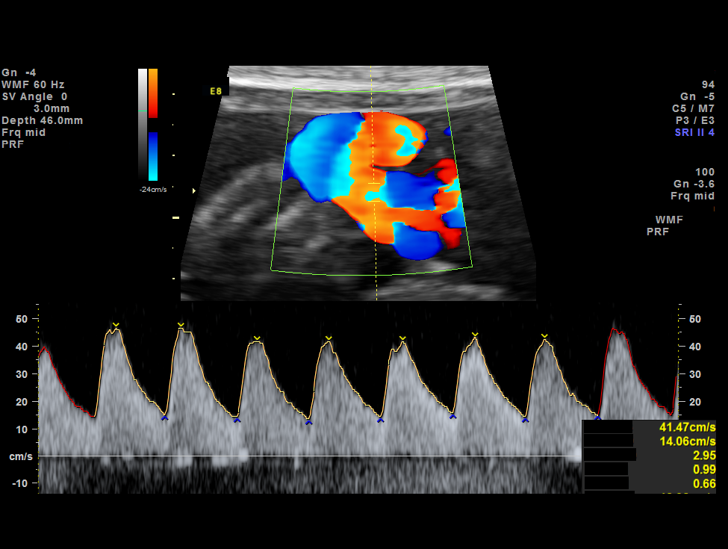

[12 of 28 positions shown; findings below may reference images not displayed]

OBSTETRICS REPORT
                      (Signed Final [DATE] [DATE])

 Order#:         [[id number]_[id number]]_[id number],[WB]
                 [id number]
Procedures

 US OB FOLLOW UP                                       76816.1
 US UA CORD DOPPLER                                    76827.2
Indications

 Cleft lip, unilateral
 Club feet
 Placenta previa/Low lying: No bleeding
 Cigarette smoker
 Assess Fetal Growth / Estimated Fetal Weight
Fetal Evaluation

 Fetal Heart Rate:  135                          bpm
 Cardiac Activity:  Observed
 Presentation:      Breech
 Placenta:          Posterior ? with marginal
                    bleed
 P. Cord            Previously Visualized
 Insertion:

 Amniotic Fluid
 AFI FV:      Subjectively within normal limits
                                             Larg Pckt:     5.0  cm
Biometry

 BPD:     60.4  mm     G. Age:  24w 4d                CI:         67.6   70 - 86
 OFD:     89.4  mm                                    FL/HC:      18.8   18.6 -

 HC:     240.9  mm     G. Age:  26w 1d        7  %    HC/AC:      1.15   1.05 -

 AC:       209  mm     G. Age:  25w 4d        8  %    FL/BPD:     75.0   71 - 87
 FL:      45.3  mm     G. Age:  25w 0d      < 3  %    FL/AC:      21.7   20 - 24
 HUM:     43.4  mm     G. Age:  25w 6d       21  %
 CER:     29.9  mm     G. Age:  26w 3d       38  %

  LEFT
 FL:      45.6  mm     G. Age:  25w 1d      < 3  %
 TIB:     38.3  mm     G. Age:  24w 4d      < 5  %
 FIB:     37.1  mm     G. Age:  24w 0d       21  %
  RIGHT
 HUM:     43.4  mm     G. Age:  25w 6d       21  %
 FL:        45  mm     G. Age:  24w 6d      < 3  %
 ULN:     36.9  mm     G. Age:  24w 4d      < 5  %
 TIB:     38.2  mm     G. Age:  24w 4d      < 5  %
 RAD:     34.9  mm     G. Age:  24w 3d       19  %
 FIB:     37.3  mm     G. Age:  24w 1d       22  %

 Est. FW:     794  gm    1 lb 12 oz      21  %
Gestational Age

 LMP:           29w 1d        Date:  [DATE]                 EDD:   [DATE]
 U/S Today:     25w 2d                                        EDD:   [DATE]
 Best:          27w 0d     Det. By:  U/S    ([DATE])        EDD:   [DATE]
Anatomy

 Cranium:           Appears normal      Aortic Arch:       Previously seen
 Fetal Cavum:       Appears normal      Ductal Arch:       Previously seen
 Ventricles:        Appears normal      Diaphragm:         Previously seen
 Choroid Plexus:    Previously seen     Stomach:           Appears
                                                           normal, left
                                                           sided
 Cerebellum:        Appears normal      Abdomen:           Appears normal
 Posterior Fossa:   Appears normal      Abdominal Wall:    Previously seen
 Nuchal Fold:       Previously seen     Cord Vessels:      Previously seen
 Face:              Unilateral right    Kidneys:           Appear normal
                    cleft lip +
                    palate
 Heart:             Appears normal      Bladder:           Appears normal
                    (4 chamber &
                    axis)
 RVOT:              Previously seen     Spine:             Previously seen
 LVOT:              Previously seen     Limbs:             Bilateral
                                                           clubbed feet

 Other:     Male gender. 5th digit previously seen.
Targeted Anatomy

 Fetal Central Nervous System
 Cisterna Magna:
Doppler - Fetal Vessels

 Umbilical Artery
 S/D:   3.3            57  %tile       RI:
 PI:    1.15                           PSV:       41.4    cm/s
 Umbilical Artery
 Absent DFV:    No     Reverse DFV:    No

Cervix Uterus Adnexa

 Cervical Length:    3.1      cm

 Cervix:       Normal appearance by transabdominal scan.
Impression

 IUP at 27+0 weeks
 Unilateral cleft lip/palate; bilateral club feet; no new findings
 All other interval fetal anatomy was seen and appeared
 normal
 Normal amniotic fluid volume
 EFW at the 21st %tile; AC at the 8th %tile; most
 measurements lag by 2 weeks
 UA dopplers were normal for this GA
 Posterior placenta with ? new bleed over cervix?;
 placenta/cervix relationship not established
Recommendations

 Follow-up ultrasound for growth and reassessment of
 placental location in 3 weeks

 questions or concerns.

## 2012-03-01 ENCOUNTER — Telehealth (HOSPITAL_COMMUNITY): Payer: Self-pay | Admitting: MS"

## 2012-03-01 NOTE — Telephone Encounter (Signed)
Left message for patient to return call regarding good news.   Sharon [name] 03/01/2012 4:18 PM

## 2012-03-01 NOTE — Telephone Encounter (Signed)
Called Sharon [name] to discuss her [facility], cell free fetal DNA testing. Testing was offered because of abnormal ultrasound findings. We reviewed that these are within normal limits, showing a less than 1 in 10,000 risk for trisomies 21, 18 and 13. We reviewed that this testing identifies > 99% of pregnancies with trisomy 21, >98% of pregnancies with trisomy 53, and >80% with trisomy 22; the false positive rate is <0.1% for all conditions. She understands that this testing does not identify all genetic conditions. All questions were answered to her satisfaction, she was encouraged to call with additional questions or concerns.  Quinn Plowman, MS Patent attorney

## 2012-03-12 ENCOUNTER — Other Ambulatory Visit: Payer: Self-pay | Admitting: Obstetrics

## 2012-03-12 DIAGNOSIS — IMO0002 Reserved for concepts with insufficient information to code with codable children: Secondary | ICD-10-CM

## 2012-03-14 ENCOUNTER — Ambulatory Visit (HOSPITAL_COMMUNITY)
Admission: RE | Admit: 2012-03-14 | Discharge: 2012-03-14 | Disposition: A | Discharge status: Home / Self Care | Payer: Medicaid Other | Source: Ambulatory Visit | Attending: Obstetrics | Admitting: Obstetrics

## 2012-03-14 ENCOUNTER — Other Ambulatory Visit: Payer: Self-pay | Admitting: Obstetrics

## 2012-03-14 DIAGNOSIS — IMO0002 Reserved for concepts with insufficient information to code with codable children: Secondary | ICD-10-CM

## 2012-03-14 DIAGNOSIS — O99330 Smoking (tobacco) complicating pregnancy, unspecified trimester: Secondary | ICD-10-CM | POA: Insufficient documentation

## 2012-03-14 DIAGNOSIS — O4440 Low lying placenta NOS or without hemorrhage, unspecified trimester: Secondary | ICD-10-CM

## 2012-03-14 DIAGNOSIS — O365990 Maternal care for other known or suspected poor fetal growth, unspecified trimester, not applicable or unspecified: Secondary | ICD-10-CM | POA: Insufficient documentation

## 2012-03-14 DIAGNOSIS — O358XX0 Maternal care for other (suspected) fetal abnormality and damage, not applicable or unspecified: Secondary | ICD-10-CM | POA: Insufficient documentation

## 2012-03-14 DIAGNOSIS — O4400 Placenta previa specified as without hemorrhage, unspecified trimester: Secondary | ICD-10-CM | POA: Insufficient documentation

## 2012-03-14 IMAGING — US US OB FOLLOW-UP
1 series · 12 of 28 positions shown · non-contrast
Comparison: none

[Series 1: us ob follow-up · 0.19mm/px · 12 of 56 slices shown]
[im 3/56]
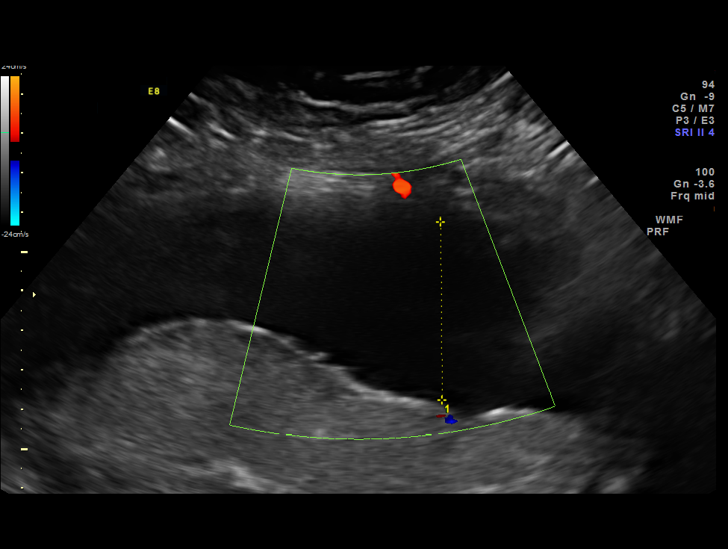
[im 7/56]
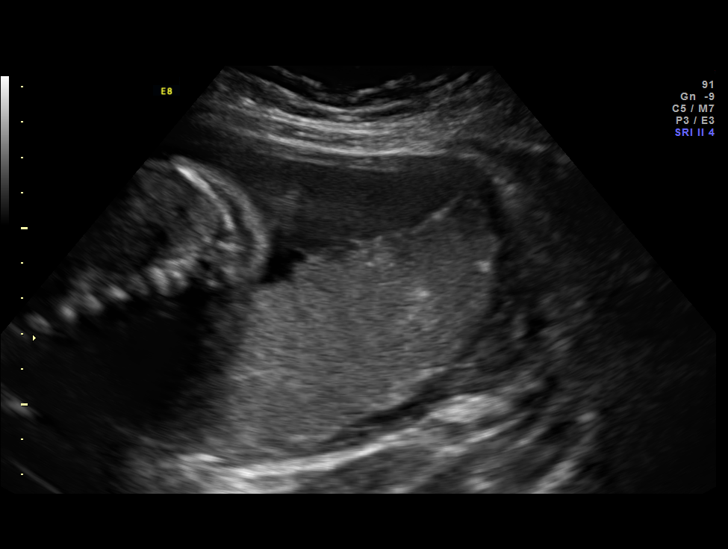
[im 11/56]
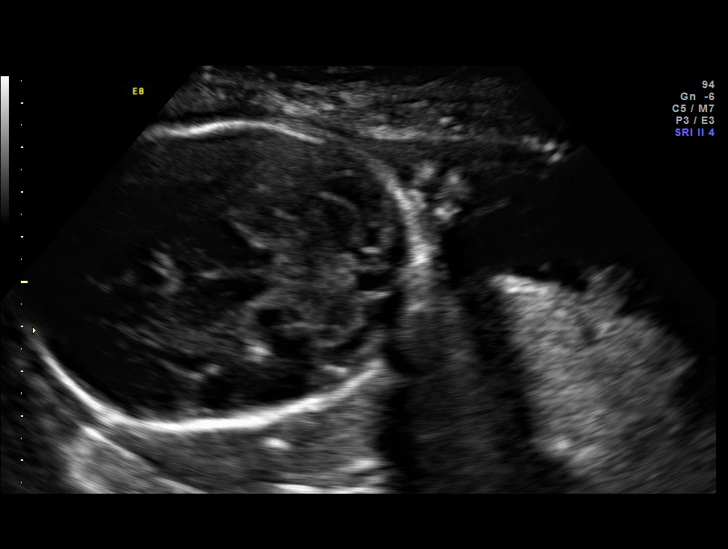
[im 17/56]
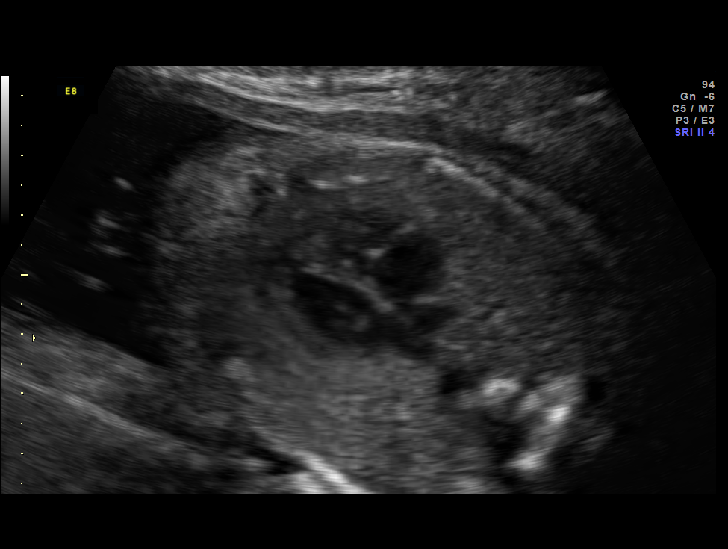
[im 21/56]
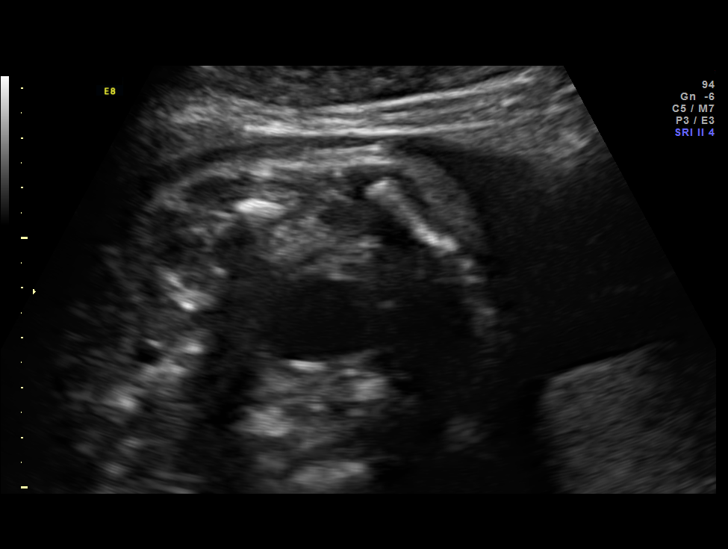
[im 25/56]
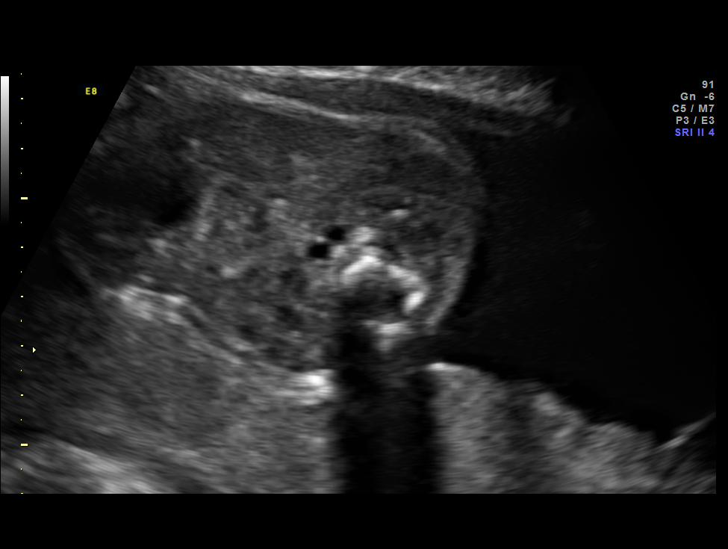
[im 31/56]
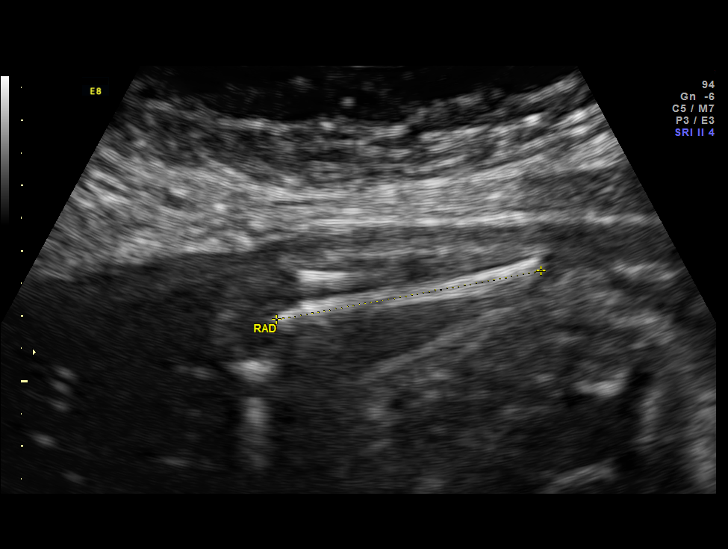
[im 35/56]
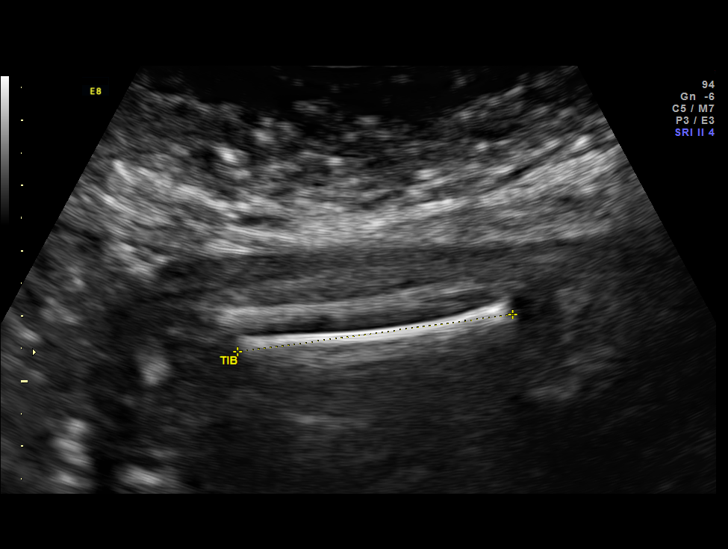
[im 39/56]
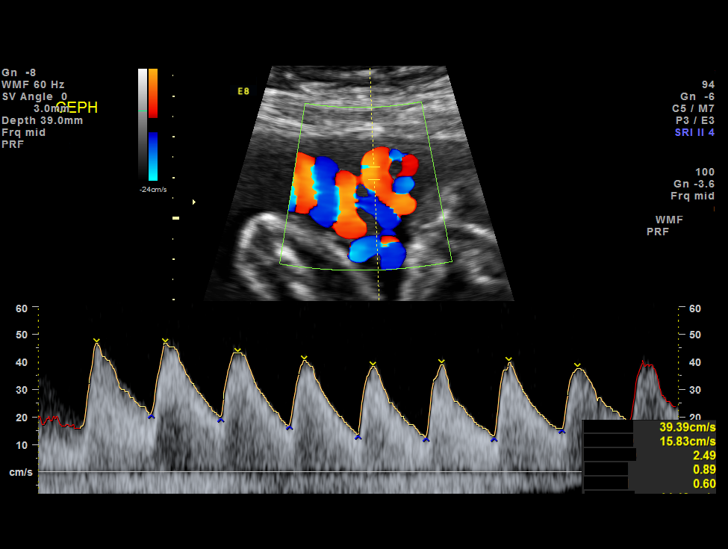
[im 45/56]
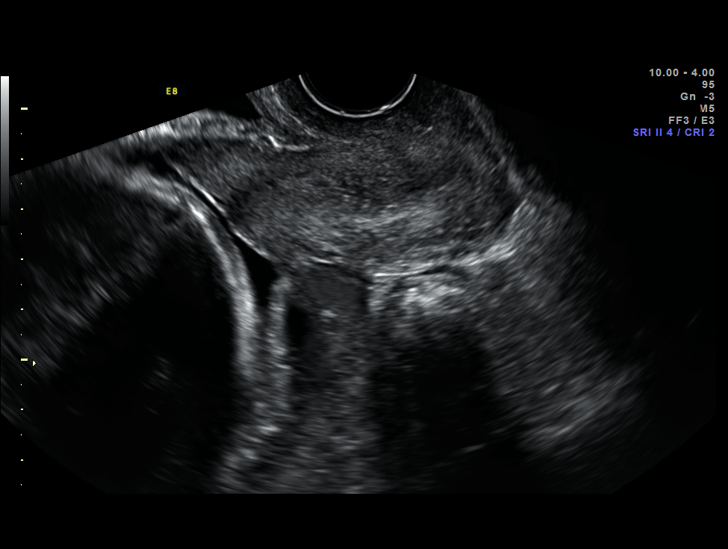
[im 49/56]
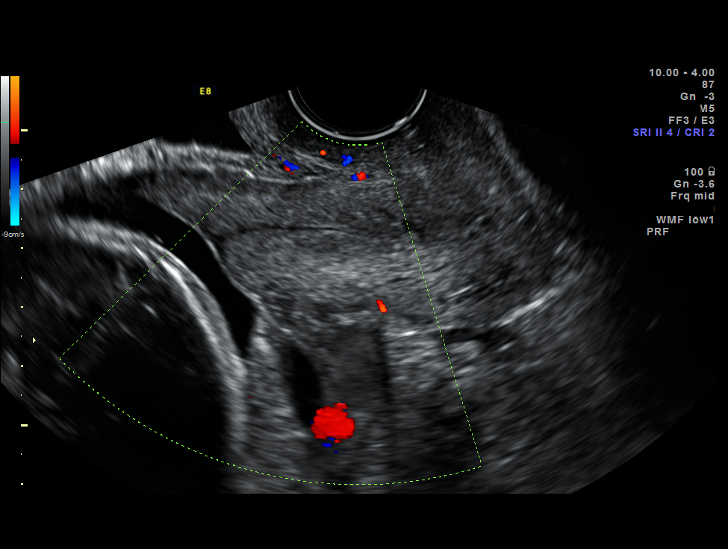
[im 53/56]
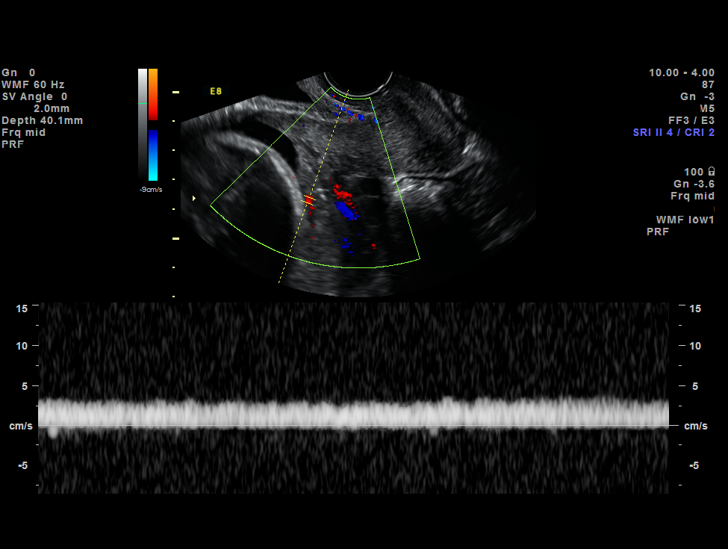

[12 of 28 positions shown; findings below may reference images not displayed]

OBSTETRICS REPORT
                    (Corrected Final [DATE] [DATE])

Service(s) Provided

 US OB FOLLOW UP                                       76816.1
 US UA DOPPLER RE-EVAL                                 76828.1
 US OB TRANSVAGINAL                                    76817.0
Indications

 Cleft lip, unilateral
 Club feet
 Placenta previa/Low lying: No bleeding
 Cigarette smoker
 Size less than dates (Small for gestational [AGE]
 FGR)
 Assess Fetal Growth / Estimated Fetal Weight
 Assess fetal well being
Fetal Evaluation

 Num Of Fetuses:    1
 Fetal Heart Rate:  152                          bpm
 Cardiac Activity:  Observed
 Presentation:      Cephalic
 Placenta:          Asymmetric Posterior left
                    Previa
 P. Cord            Previously Visualized
 Insertion:

 Amniotic Fluid
 AFI FV:      Subjectively within normal limits
                                             Larg Pckt:     4.5  cm
Biophysical Evaluation

 Amniotic F.V:   Pocket => 2 cm two         F. Tone:        Observed
                 planes
 F. Movement:    Observed                   Score:          [DATE]
 F. Breathing:   Observed
Biometry

 BPD:     66.7  mm     G. Age:  26w 6d                CI:         69.7   70 - 86
 OFD:     95.7  mm                                    FL/HC:      19.8   19.2 -

 HC:     260.4  mm     G. Age:  28w 2d      < 3  %    HC/AC:      1.09   0.99 -

 AC:     238.5  mm     G. Age:  28w 1d        6  %    FL/BPD:     77.2   71 - 87
 FL:      51.5  mm     G. Age:  27w 4d      < 3  %    FL/AC:      21.6   20 - 24
 HUM:       46  mm     G. Age:  27w 1d      < 5  %
 CER:     35.1  mm     G. Age:  30w 1d       51  %

  LEFT

  RIGHT
 HUM:       46  mm     G. Age:  27w 1d      < 5  %
 FL:      51.5  mm     G. Age:  27w 4d      < 3  %
 ULN:     37.5  mm     G. Age:  24w 6d      < 5  %
 TIB:     42.6  mm     G. Age:  26w 3d      < 5  %
 RAD:     41.3  mm     G. Age:  28w 5d       42  %
 FIB:     42.1  mm     G. Age:  26w 0d       26  %

 Est. FW:    [KA]  gm      2 lb 8 oz     17  %
Gestational Age

 LMP:           32w 1d        Date:  [DATE]                 EDD:   [DATE]
 U/S Today:     27w 5d                                        EDD:   [DATE]
 Best:          30w 0d     Det. By:  U/S    ([DATE])        EDD:   [DATE]
Anatomy

 Cranium:          Appears normal         Aortic Arch:      Previously seen
 Fetal Cavum:      Appears normal         Ductal Arch:      Previously seen
 Ventricles:       Appears normal         Diaphragm:        Previously seen
 Choroid Plexus:   Previously seen        Stomach:          Appears normal, left
                                                            sided
 Cerebellum:       Appears normal         Abdomen:          Appears normal
 Posterior Fossa:  Appears normal         Abdominal Wall:   Previously seen
 Nuchal Fold:      Previously seen        Cord Vessels:     Previously seen
 Face:             Orbits and profile     Kidneys:          Appear normal
                   previously seen
 Lips:             Unilateral right       Bladder:          Appears normal
                   cleft lip
 Palate:           Cleft palate           Spine:            Previously seen
                   suspected
 Heart:            Appears normal (4      Lower             Clubfeet
                   chamber & axis)        Extremities:
 RVOT:             Previously seen        Upper             Previously seen
                                          Extremities:
 LVOT:             Previously seen        Limbs:            Bilateral clubbed
                                                            feet

 Other:  Male gender. 5th digit previously seen.
Doppler - Fetal Vessels

 Umbilical Artery
 S/D:   2.51           32  %tile       RI:
 PI:    0.93                           PSV:       45.95   cm/s
 Umbilical Artery
 Absent DFV:    No     Reverse DFV:    No

Cervix Uterus Adnexa

 Cervical Length:    4.4      cm
 Cervix:       Measured transvaginally.
Impression

 IUP at 30+0 weeks
 Unilateral cleft lip + / - palate; bilateral club feet
 All other interval fetal anatomy was seen and appeared
 normal
 Normal amniotic fluid volume
 EFW at the 17th %tile; AC at the 6th %tile
 EV views of cervix: low-lying/partial previa; normal cervix
 UA dopplers were normal for this GA
 BPP [DATE]
Recommendations

 Begin twice weekly NSTs with weekly AFIs or BPPs
 Growth US in 3 weeks - also reassess placental location

 questions or concerns.
                 Attending Physician, HIGO

## 2012-03-19 ENCOUNTER — Other Ambulatory Visit: Payer: Self-pay | Admitting: Obstetrics

## 2012-03-19 DIAGNOSIS — Q6601 Congenital talipes equinovarus, right foot: Secondary | ICD-10-CM

## 2012-03-19 DIAGNOSIS — IMO0002 Reserved for concepts with insufficient information to code with codable children: Secondary | ICD-10-CM

## 2012-03-21 ENCOUNTER — Ambulatory Visit (HOSPITAL_COMMUNITY)
Admission: RE | Admit: 2012-03-21 | Discharge: 2012-03-21 | Disposition: A | Discharge status: Home / Self Care | Payer: Medicaid Other | Source: Ambulatory Visit | Attending: Obstetrics | Admitting: Obstetrics

## 2012-03-21 DIAGNOSIS — O99330 Smoking (tobacco) complicating pregnancy, unspecified trimester: Secondary | ICD-10-CM | POA: Insufficient documentation

## 2012-03-21 DIAGNOSIS — IMO0002 Reserved for concepts with insufficient information to code with codable children: Secondary | ICD-10-CM

## 2012-03-21 DIAGNOSIS — O358XX0 Maternal care for other (suspected) fetal abnormality and damage, not applicable or unspecified: Secondary | ICD-10-CM | POA: Insufficient documentation

## 2012-03-21 DIAGNOSIS — O365990 Maternal care for other known or suspected poor fetal growth, unspecified trimester, not applicable or unspecified: Secondary | ICD-10-CM | POA: Insufficient documentation

## 2012-03-21 DIAGNOSIS — Q6601 Congenital talipes equinovarus, right foot: Secondary | ICD-10-CM

## 2012-03-21 DIAGNOSIS — O4400 Placenta previa specified as without hemorrhage, unspecified trimester: Secondary | ICD-10-CM | POA: Insufficient documentation

## 2012-03-21 IMAGING — US US FETAL BPP W/O NONSTRESS
1 series · 13 of 19 positions shown · non-contrast
Comparison: none

[Series 1: us fetal bpp w/o nonstress · 0.23mm/px · 13 of 19 slices shown]
[im 1/19]
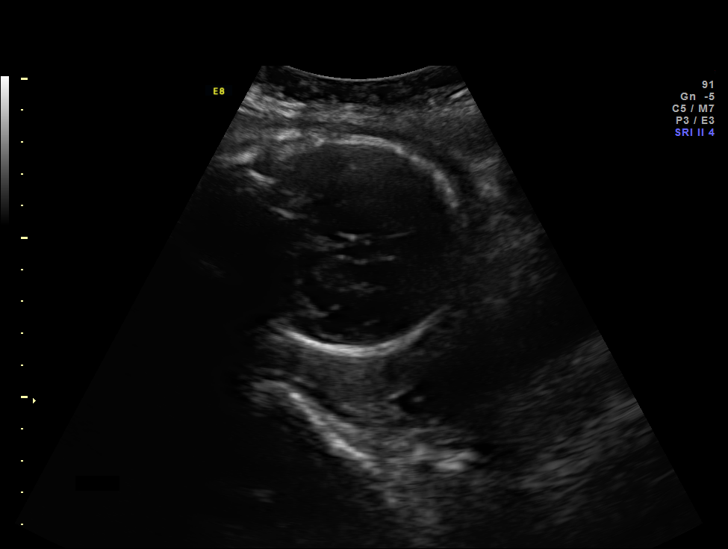
[im 3/19]
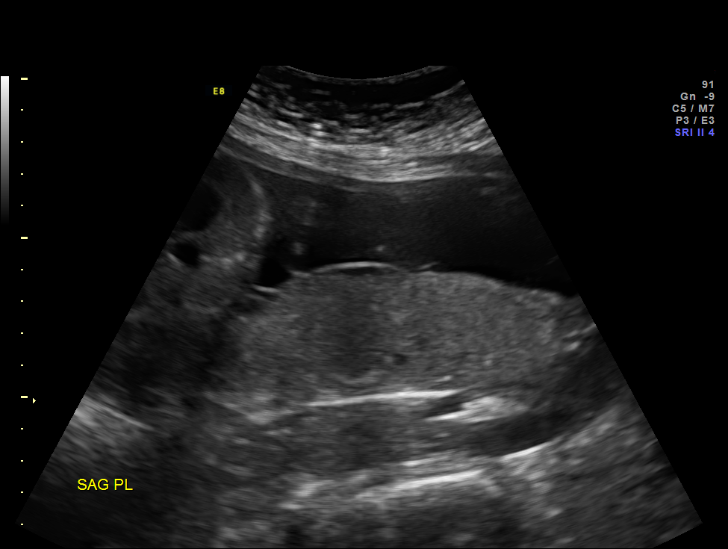
[im 4/19]
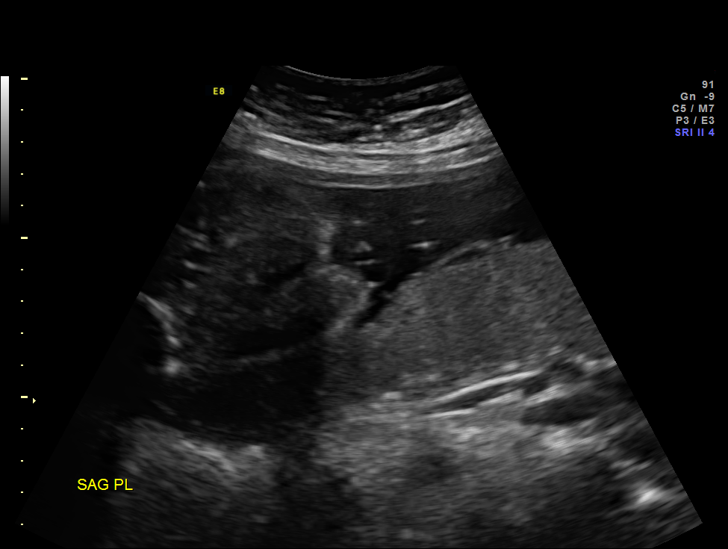
[im 6/19]
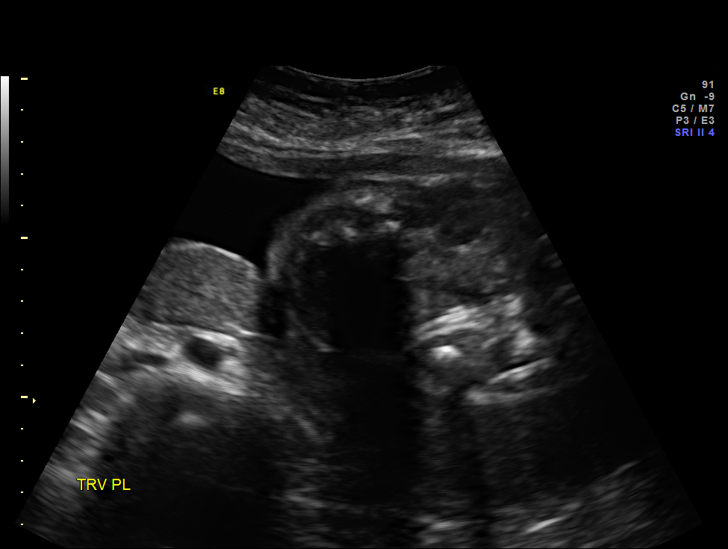
[im 7/19]
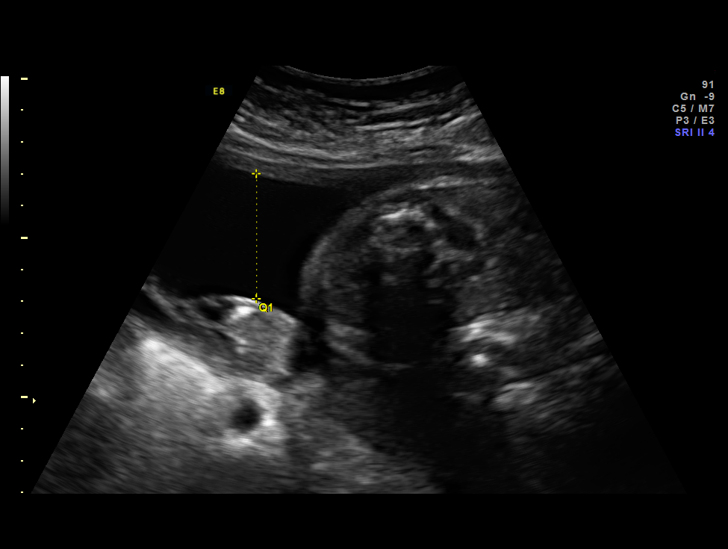
[im 9/19]
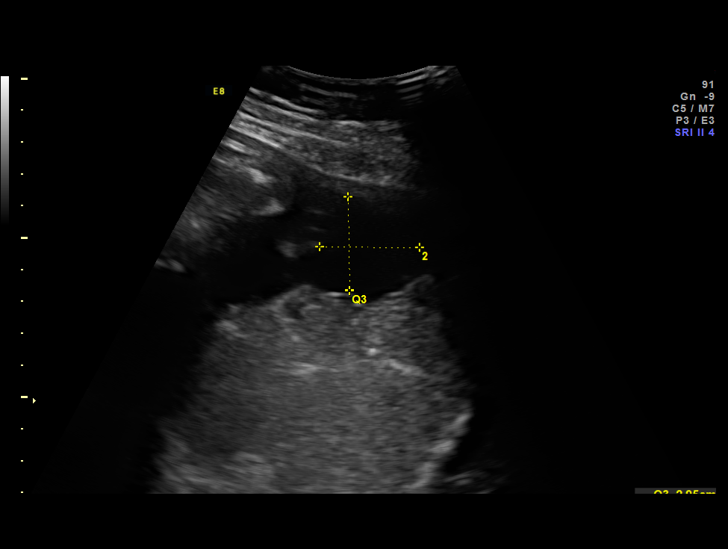
[im 10/19]
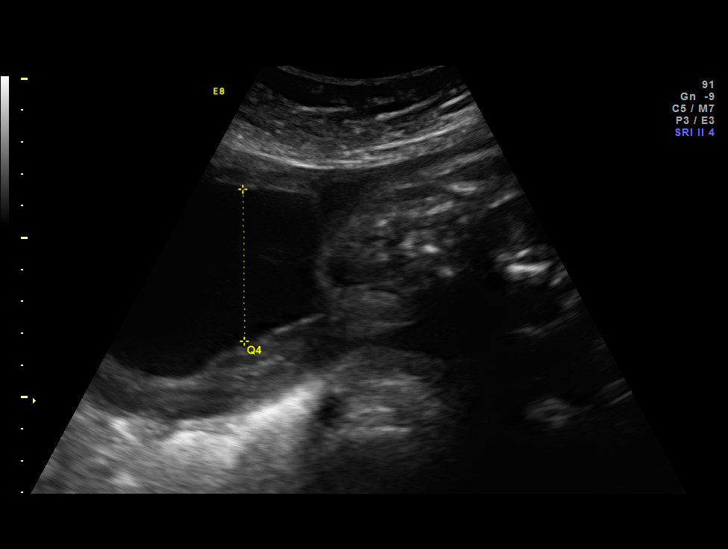
[im 11/19]
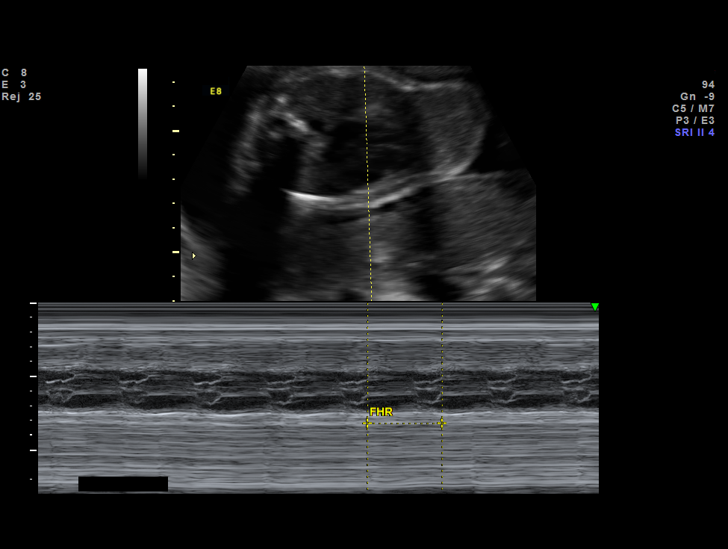
[im 13/19]
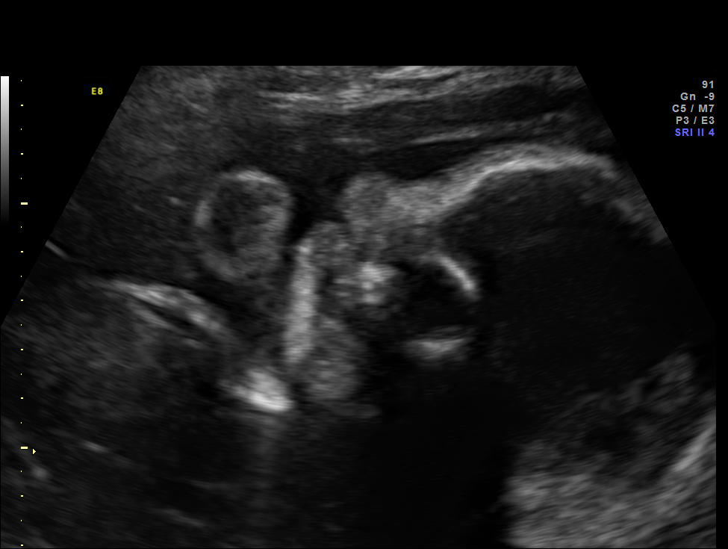
[im 14/19]
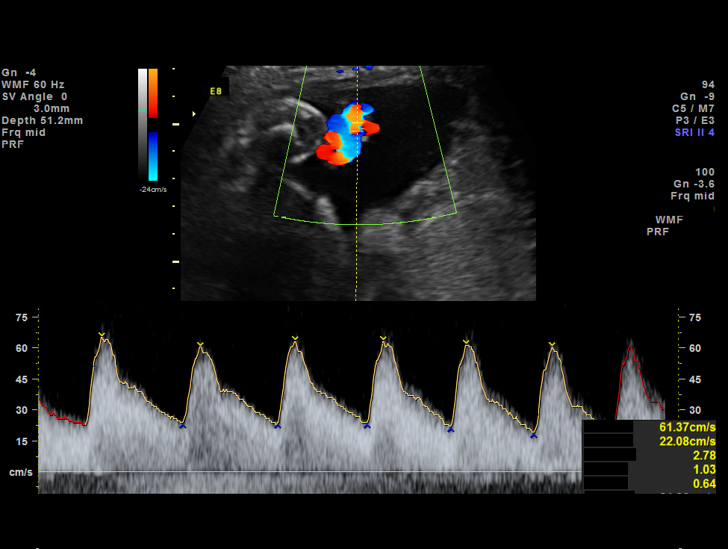
[im 16/19]
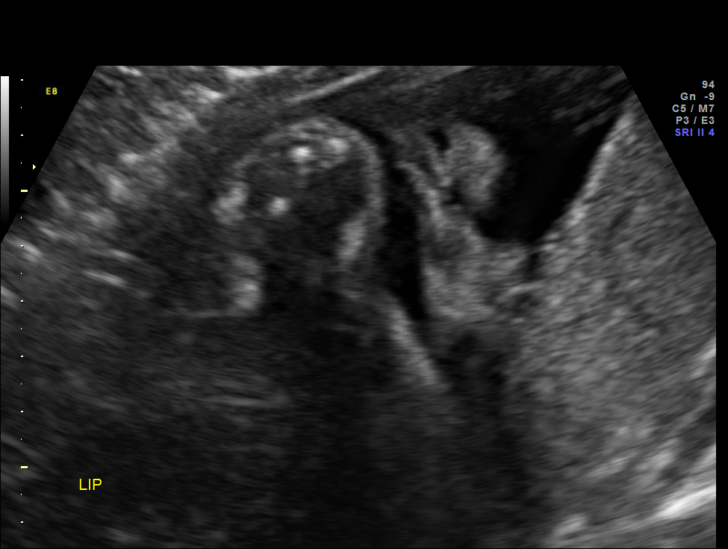
[im 17/19]
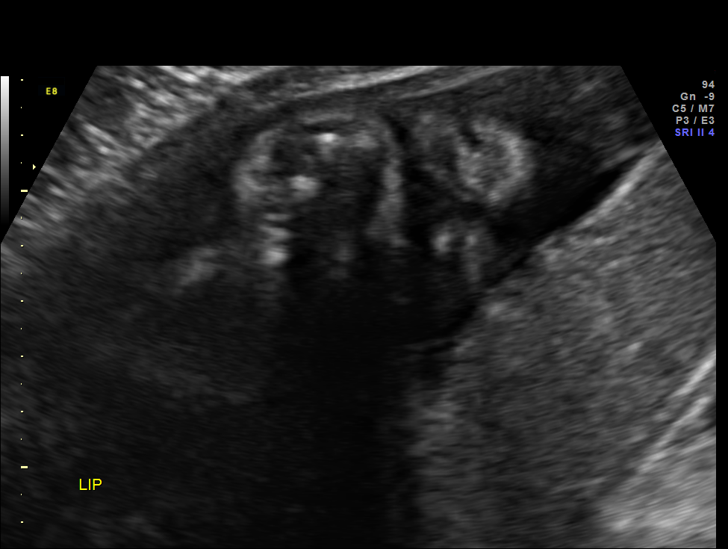
[im 19/19]
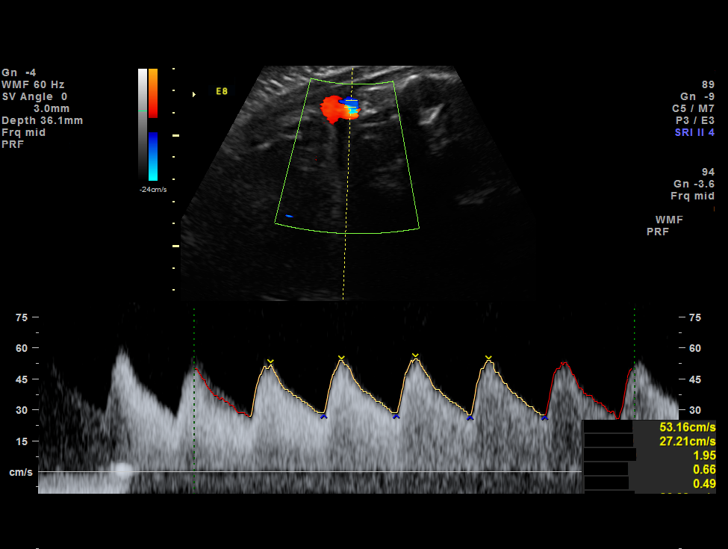

[13 of 19 positions shown; findings below may reference images not displayed]

OBSTETRICS REPORT
                      (Signed Final [DATE] [DATE])

 Name:       JOENIO                Visit Date: [DATE] [DATE]

Service(s) Provided

 US UA CORD DOPPLER                                    76827.2
Indications

 Cleft lip, unilateral
 Club feet
 Placenta previa/Low lying: No bleeding
 Cigarette smoker
 Size less than dates (Small for gestational [AGE]
 FGR)
 Assess fetal well being
Fetal Evaluation

 Num Of Fetuses:    1
 Fetal Heart Rate:  135                          bpm
 Cardiac Activity:  Observed
 Presentation:      Cephalic
 Placenta:          Asymmetric Posterior left
                    Previa
 P. Cord            Previously Visualized
 Insertion:

 Amniotic Fluid
 AFI FV:      Subjectively within normal limits
 AFI Sum:     14.51   cm       51  %Tile     Larg Pckt:    4.77  cm
 RUQ:   3.93    cm   RLQ:    4.77   cm    LUQ:   2.86    cm   LLQ:    2.95   cm
Biophysical Evaluation

 Amniotic F.V:   Within normal limits       F. Tone:        Observed
 F. Movement:    Observed                   Score:          [DATE]
 F. Breathing:   Observed
Gestational Age

 LMP:           33w 1d        Date:  [DATE]                 EDD:   [DATE]
 Best:          31w 0d     Det. By:  U/S    ([DATE])        EDD:   [DATE]
Doppler - Fetal Vessels
 Umbilical Artery
 S/D:   2.78           51  %tile       RI:
 PI:    1.03                           PSV:       61.37   cm/s

Cervix Uterus Adnexa

 Cervix:       Not visualized
Comments

 There is an active singleton fetus with unilateral cleft lip and
 bilateral club feet on today's routine anatomic re-examination.
Impression

 Active singleton fetus.
 Unilateral cleft lip
 Bilateral club feet.
 Normal amniotic fluid volume
 UA Doppler S/D is near the mean for gestational age (normal)
 BPP is [DATE] (reassuring)
Recommendations

 1. Repeat Doppler and BPP in 1 week as previously
 scheduled.
 2. Follow as clinically indicated.

 questions or concerns.

## 2012-03-21 NOTE — Progress Notes (Signed)
Ms. Sala had an ultrasound appointment today.  Please see [facility] report for details.  Comments There is an active singleton fetus with unilateral cleft lip and bilateral club feet on today's routine anatomic re-examination.    Impression Active singleton fetus. Unilateral cleft lip Bilateral club feet. Normal amniotic fluid volume UA Doppler S/D is near the mean for gestational age (normal) BPP is 8/8 (reassuring)   Recommendations 1. Repeat Doppler and BPP in 1 week as previously scheduled. 2. Follow as clinically indicated.  Rogelia Boga, MD, MS, FACOG Assistant Professor Section of Maternal-Fetal Medicine [facility]

## 2012-03-28 ENCOUNTER — Other Ambulatory Visit (HOSPITAL_COMMUNITY): Payer: Self-pay | Admitting: Obstetrics and Gynecology

## 2012-03-28 DIAGNOSIS — Q6602 Congenital talipes equinovarus, left foot: Secondary | ICD-10-CM

## 2012-03-29 ENCOUNTER — Encounter (HOSPITAL_COMMUNITY): Payer: Self-pay

## 2012-03-29 ENCOUNTER — Ambulatory Visit (HOSPITAL_COMMUNITY)
Admission: RE | Admit: 2012-03-29 | Discharge: 2012-03-29 | Disposition: A | Discharge status: Home / Self Care | Payer: Medicaid Other | Source: Ambulatory Visit | Attending: Obstetrics | Admitting: Obstetrics

## 2012-03-29 ENCOUNTER — Other Ambulatory Visit (HOSPITAL_COMMUNITY): Payer: Self-pay | Admitting: Obstetrics and Gynecology

## 2012-03-29 DIAGNOSIS — Q369 Cleft lip, unilateral: Secondary | ICD-10-CM

## 2012-03-29 DIAGNOSIS — O99330 Smoking (tobacco) complicating pregnancy, unspecified trimester: Secondary | ICD-10-CM | POA: Insufficient documentation

## 2012-03-29 DIAGNOSIS — Q6602 Congenital talipes equinovarus, left foot: Secondary | ICD-10-CM

## 2012-03-29 DIAGNOSIS — O4400 Placenta previa specified as without hemorrhage, unspecified trimester: Secondary | ICD-10-CM | POA: Insufficient documentation

## 2012-03-29 DIAGNOSIS — IMO0002 Reserved for concepts with insufficient information to code with codable children: Secondary | ICD-10-CM

## 2012-03-29 DIAGNOSIS — O365990 Maternal care for other known or suspected poor fetal growth, unspecified trimester, not applicable or unspecified: Secondary | ICD-10-CM | POA: Insufficient documentation

## 2012-03-29 DIAGNOSIS — Q6601 Congenital talipes equinovarus, right foot: Secondary | ICD-10-CM

## 2012-03-29 DIAGNOSIS — O358XX0 Maternal care for other (suspected) fetal abnormality and damage, not applicable or unspecified: Secondary | ICD-10-CM | POA: Insufficient documentation

## 2012-03-29 NOTE — Progress Notes (Signed)
Sharon [name]  was seen today for an ultrasound appointment.  See full report in [facility].  Alpha Gula, MD  Single IUP at 32 1/7 weeks Active singleton fetus. Unilateral cleft lip Bilateral club feet. Normal amniotic fluid volume UA Doppler S/D is near the mean for gestational age (normal) Reactive NST - Normal modifed BPP  Recommend 2x weekly testing - Weekly BPPs with UA Doppler studies at [facility] / weekly NSTs are referring office Plan follow up growth scan on 10/18  with TVUS to reasses placental location.

## 2012-04-05 ENCOUNTER — Ambulatory Visit (HOSPITAL_COMMUNITY): Admission: RE | Admit: 2012-04-05 | Payer: Medicaid Other | Source: Ambulatory Visit

## 2012-04-05 ENCOUNTER — Ambulatory Visit (HOSPITAL_COMMUNITY)
Admission: RE | Admit: 2012-04-05 | Discharge: 2012-04-05 | Disposition: A | Discharge status: Home / Self Care | Payer: Medicaid Other | Source: Ambulatory Visit | Attending: Obstetrics | Admitting: Obstetrics

## 2012-04-05 DIAGNOSIS — O365990 Maternal care for other known or suspected poor fetal growth, unspecified trimester, not applicable or unspecified: Secondary | ICD-10-CM | POA: Insufficient documentation

## 2012-04-05 DIAGNOSIS — O99330 Smoking (tobacco) complicating pregnancy, unspecified trimester: Secondary | ICD-10-CM | POA: Insufficient documentation

## 2012-04-05 DIAGNOSIS — IMO0002 Reserved for concepts with insufficient information to code with codable children: Secondary | ICD-10-CM

## 2012-04-05 DIAGNOSIS — Q369 Cleft lip, unilateral: Secondary | ICD-10-CM

## 2012-04-05 DIAGNOSIS — Q6601 Congenital talipes equinovarus, right foot: Secondary | ICD-10-CM

## 2012-04-05 DIAGNOSIS — Q6689 Other  specified congenital deformities of feet: Secondary | ICD-10-CM

## 2012-04-05 DIAGNOSIS — O4400 Placenta previa specified as without hemorrhage, unspecified trimester: Secondary | ICD-10-CM

## 2012-04-05 DIAGNOSIS — O358XX0 Maternal care for other (suspected) fetal abnormality and damage, not applicable or unspecified: Secondary | ICD-10-CM | POA: Insufficient documentation

## 2012-04-05 IMAGING — US US OB UMBILICAL ART DOPPLER
1 series · 13 of 15 positions shown · non-contrast
Comparison: none

[Series 1: us ob umbilical art doppler · 0.19mm/px · 15 acquisitions, 13 frames shown]
[im 1/15]
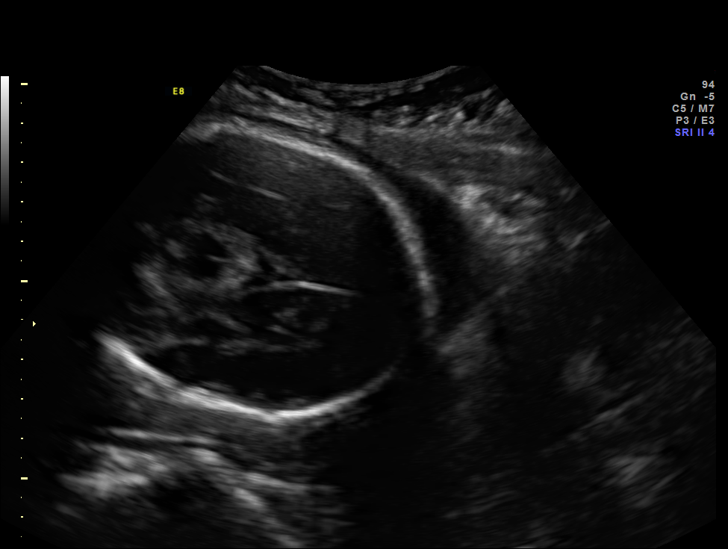
[im 2/15]
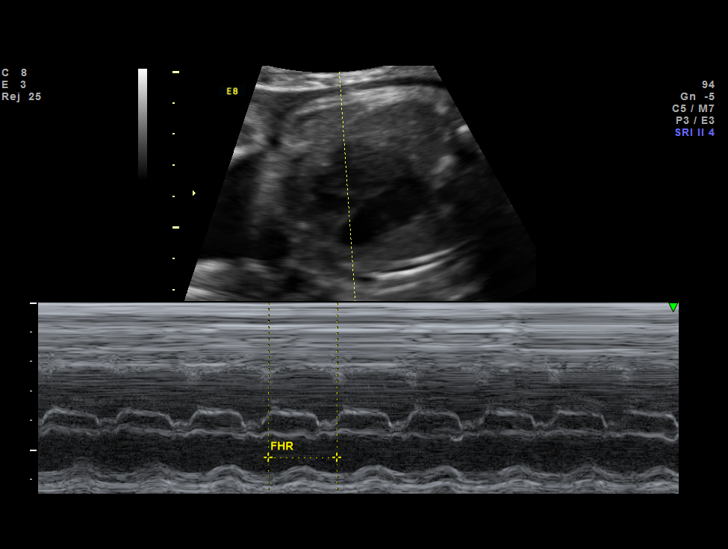
[im 3/15]
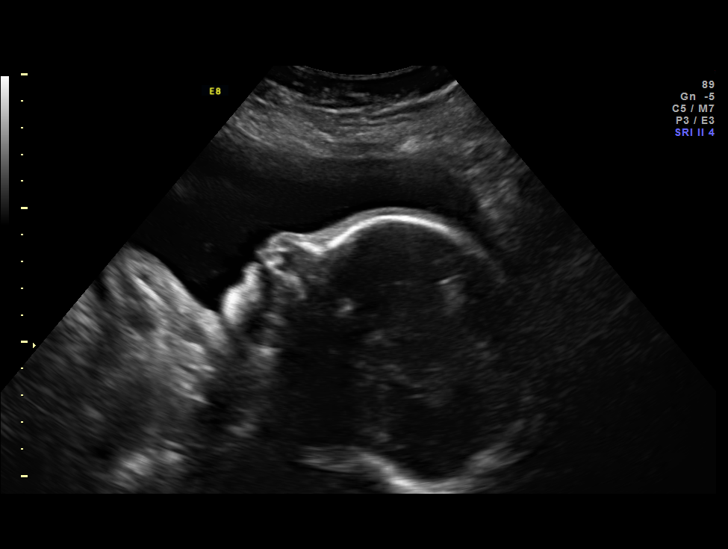
[im 5/15]
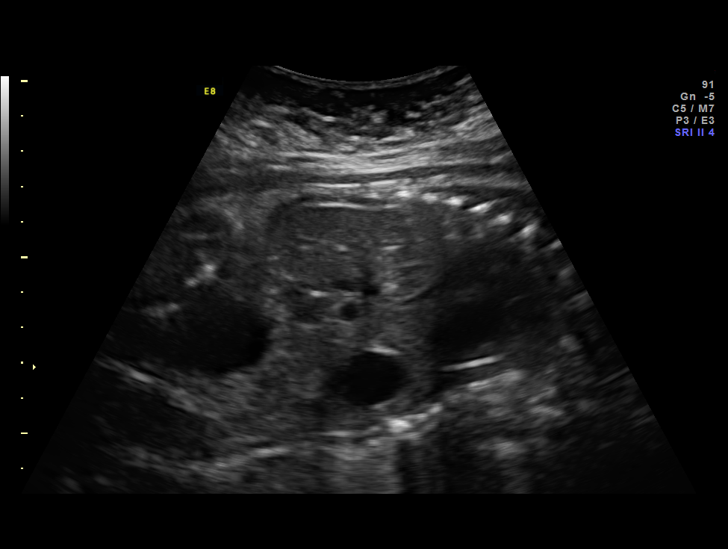
[im 6/15]
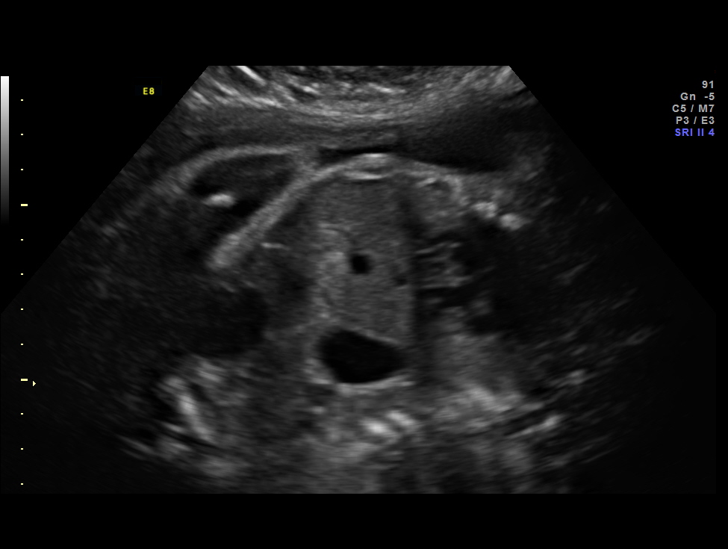
[im 7/15]
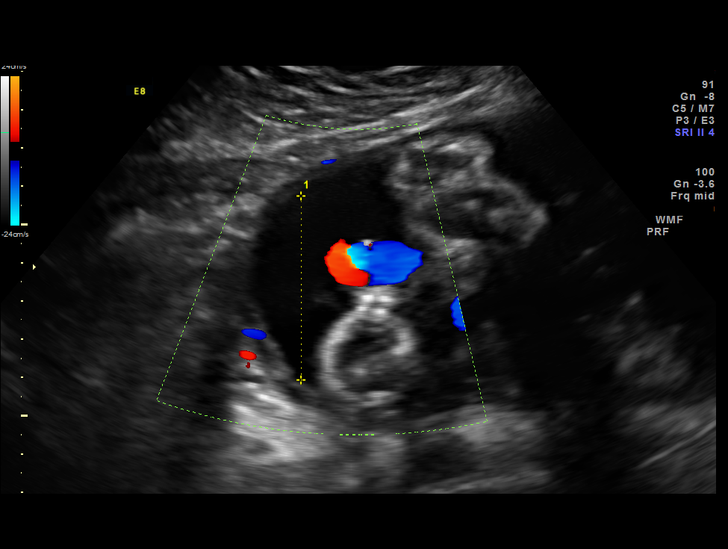
[im 8/15]
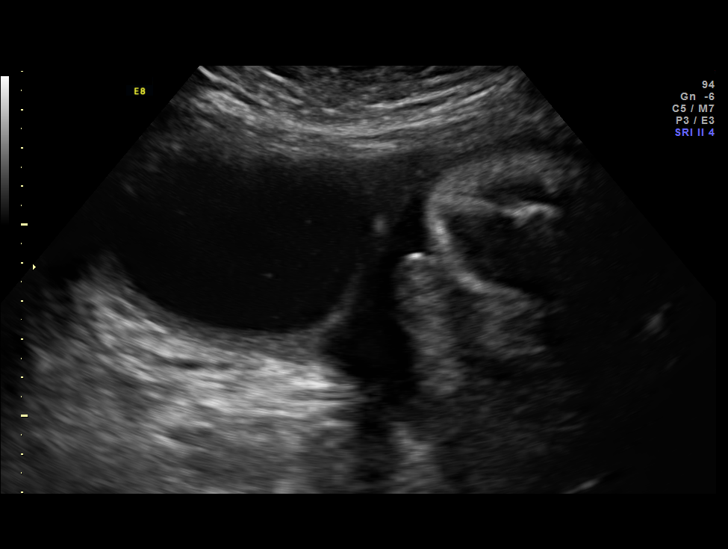
[im 9/15]
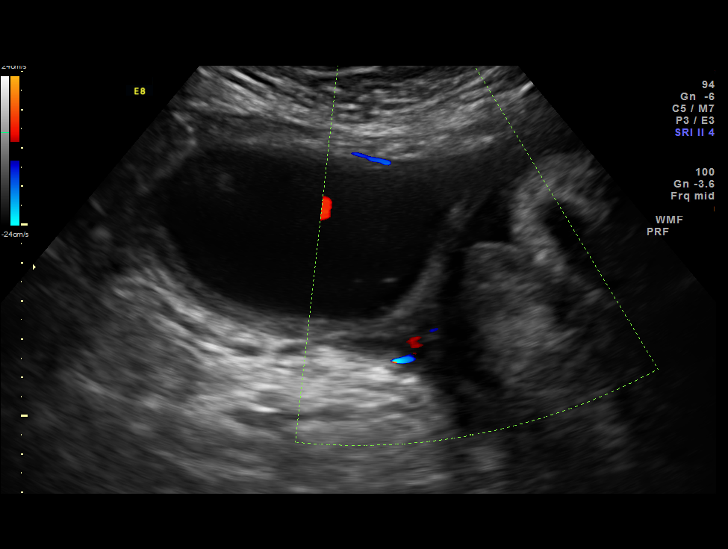
[im 10/15]
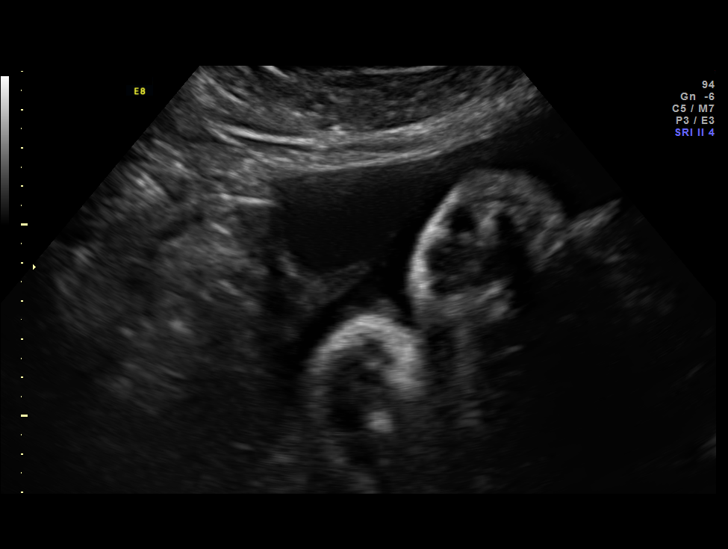
[im 11/15]
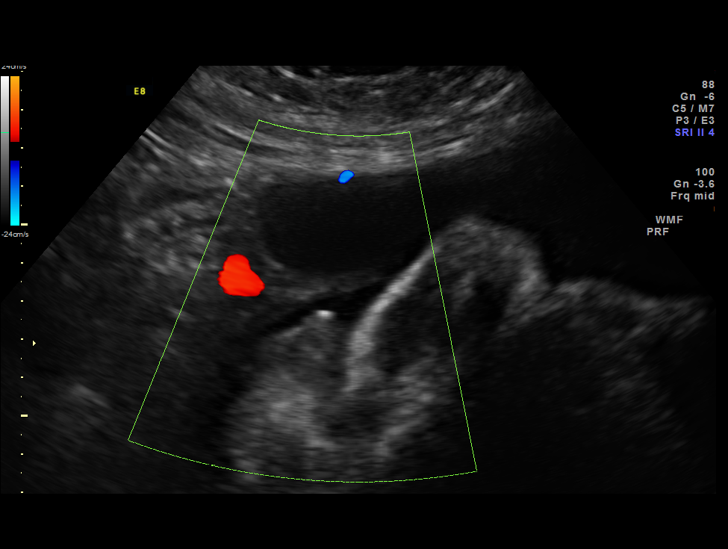
[im 13/15]
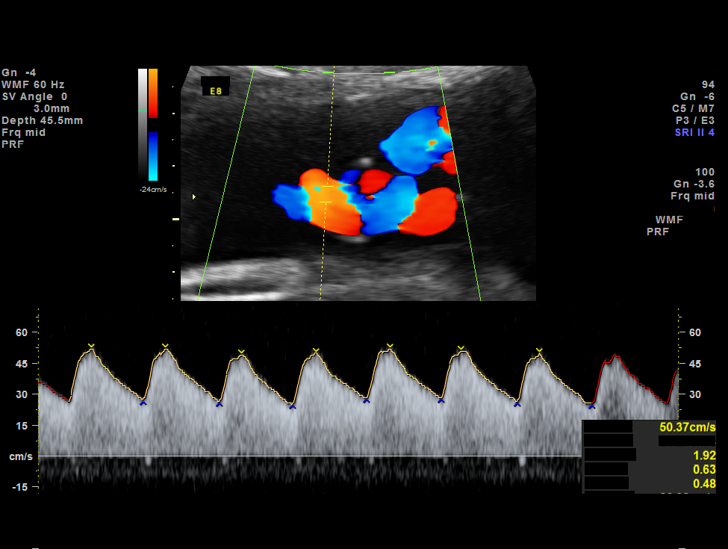
[im 14/15]
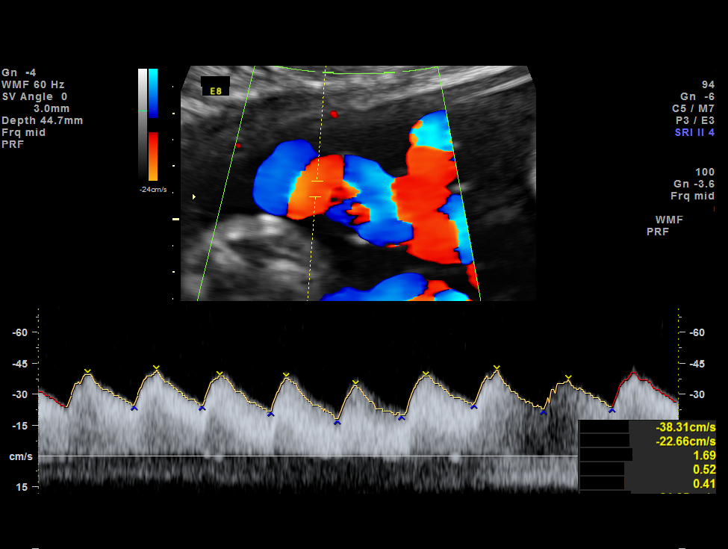
[im 15/15]
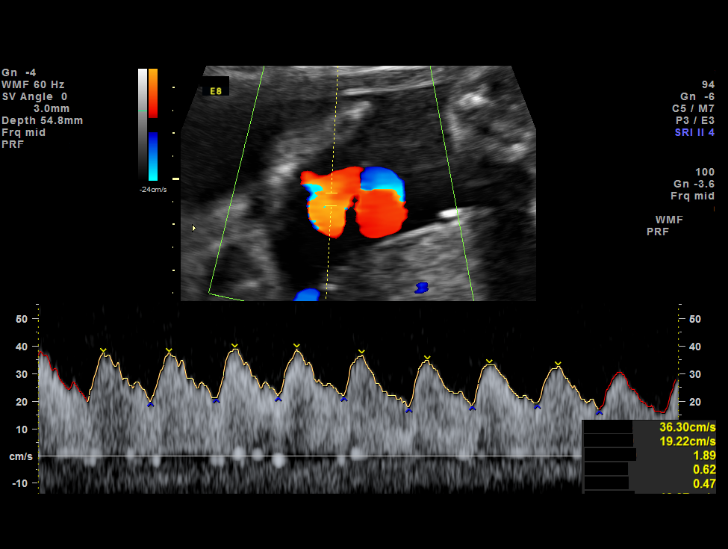

[13 of 15 positions shown; findings below may reference images not displayed]

OBSTETRICS REPORT
                      (Signed Final [DATE] [DATE])

Service(s) Provided

 US UA CORD DOPPLER                                    76827.2
Indications

 Size less than dates (Small for gestational [AGE]
 FGR)
 Cleft lip, unilateral
 Placenta previa/Low lying: No bleeding
 Cigarette smoker
 Assess fetal well being
Fetal Evaluation

 Num Of Fetuses:    1
 Fetal Heart Rate:  147                          bpm
 Cardiac Activity:  Observed
 Presentation:      Cephalic
 Placenta:          Asymmetric Posterior left
                    Previa
 P. Cord            Previously Visualized
 Insertion:

 Amniotic Fluid
 AFI FV:      Subjectively within normal limits
                                             Larg Pckt:     4.8  cm
Biophysical Evaluation

 Amniotic F.V:   Within normal limits       F. Tone:        Observed
 F. Movement:    Observed                   Score:          [DATE]
 F. Breathing:   Observed
Gestational Age

 LMP:           35w 2d        Date:  [DATE]                 EDD:   [DATE]
 Best:          33w 1d     Det. By:  U/S    ([DATE])        EDD:   [DATE]
Doppler - Fetal Vessels

 Umbilical Artery
 S/D:   1.83            7  %tile       RI:
 PI:    0.59                           PSV:       50.37   cm/s
 Umbilical Artery
 Absent DFV:    No     Reverse DFV:    No

Impression

 Single IUP at 33 [DATE] weeks
 Unilateral cleft lip+ / - palate
 Bilateral club feet.
 Active singleton fetus with BPP of [DATE]
 Umbilical artery Doppler studies within normal limits for
 gestational age
 Normal amniotic fluid volume

Recommendations

 Recommend 2x weekly testing - Weekly BPPs with UA
 Doppler studies at [facility] / weekly NSTs are referring office
 Plan follow up growth scan on [DATE]  with TVUS to reasses
 placental location.

 questions or concerns.

## 2012-04-05 NOTE — Progress Notes (Signed)
Sharon [name]  was seen today for an ultrasound appointment.  See full report in [facility].  Alpha Gula, MD  Single IUP at 33 1/7 weeks Unilateral cleft lip+/- palate Bilateral club feet. Active singleton fetus with BPP of 8/8 Umbilical artery Doppler studies within normal limits for gestational age Normal amniotic fluid volume  Recommend 2x weekly testing - Weekly BPPs with UA Doppler studies at [facility] / weekly NSTs are referring office Plan follow up growth scan on 10/18  with TVUS to reasses placental location.

## 2012-04-05 NOTE — Addendum Note (Signed)
Encounter addended by: Duanne Moron, RN on: 04/05/2012 11:31 AM<BR>     Documentation filed: Charges VN

## 2012-04-12 ENCOUNTER — Ambulatory Visit (HOSPITAL_COMMUNITY)
Admission: RE | Admit: 2012-04-12 | Discharge: 2012-04-12 | Disposition: A | Discharge status: Home / Self Care | Payer: Medicaid Other | Source: Ambulatory Visit | Attending: Obstetrics | Admitting: Obstetrics

## 2012-04-12 VITALS — BP 119/73 | HR 93 | Wt 190.0 lb

## 2012-04-12 DIAGNOSIS — O99330 Smoking (tobacco) complicating pregnancy, unspecified trimester: Secondary | ICD-10-CM | POA: Insufficient documentation

## 2012-04-12 DIAGNOSIS — O358XX0 Maternal care for other (suspected) fetal abnormality and damage, not applicable or unspecified: Secondary | ICD-10-CM | POA: Insufficient documentation

## 2012-04-12 DIAGNOSIS — Q369 Cleft lip, unilateral: Secondary | ICD-10-CM

## 2012-04-12 DIAGNOSIS — O4400 Placenta previa specified as without hemorrhage, unspecified trimester: Secondary | ICD-10-CM | POA: Insufficient documentation

## 2012-04-12 DIAGNOSIS — O365990 Maternal care for other known or suspected poor fetal growth, unspecified trimester, not applicable or unspecified: Secondary | ICD-10-CM | POA: Insufficient documentation

## 2012-04-12 DIAGNOSIS — Q6602 Congenital talipes equinovarus, left foot: Secondary | ICD-10-CM

## 2012-04-12 DIAGNOSIS — IMO0002 Reserved for concepts with insufficient information to code with codable children: Secondary | ICD-10-CM

## 2012-04-12 IMAGING — US US OB TRANSVAGINAL
1 series · 15 of 28 positions shown · non-contrast
Comparison: none

OBSTETRICS REPORT
                      (Signed Final [DATE] [DATE])

Service(s) Provided
 US OB FOLLOW UP                                       76816.1
 US OB TRANSVAGINAL                                    76817.0
 US UA CORD DOPPLER                                    76827.2
Indications
 Size less than dates (Small for gestational [AGE]
 FGR)
 Cleft lip, unilateral
 Placenta previa/Low lying: No bleeding
 Cigarette smoker
 Assess fetal well being
Fetal Evaluation
 Num Of Fetuses:    1
 Fetal Heart Rate:  141                          bpm
 Cardiac Activity:  Observed
 Presentation:      Cephalic
 Placenta:          Posterior, low-lying,
                    cm from int os
 P. Cord            Previously Visualized
 Insertion:
 Amniotic Fluid
 AFI FV:      Subjectively within normal limits
 AFI Sum:     12.61   cm       38  %Tile     Larg Pckt:    4.61  cm
 RUQ:   4.61    cm   RLQ:    2.01   cm    LUQ:   2.57    cm   LLQ:    3.42   cm
Biophysical Evaluation
 Amniotic F.V:   Within normal limits       F. Tone:        Observed
 F. Movement:    Observed                   Score:          [DATE]
 F. Breathing:   Observed
Biometry
 BPD:     75.1  mm     G. Age:  30w 1d                CI:         65.6   70 - 86
 OFD:    114.4  mm                                    FL/HC:      19.2   19.4 -
 HC:     306.5  mm     G. Age:  34w 1d       17  %    HC/AC:      1.07   0.96 -
 AC:       287  mm     G. Age:  32w 5d       17  %    FL/BPD:     78.2   71 - 87
 FL:      58.7  mm     G. Age:  30w 5d      < 3  %    FL/AC:      20.5   20 - 24
 HUM:     51.8  mm     G. Age:  30w 2d      < 5  %
 Est. FW:    [YP]  gm      4 lb 2 oz     22  %
Gestational Age
 LMP:           36w 2d        Date:  [DATE]                 EDD:   [DATE]
 U/S Today:     31w 6d                                        EDD:   [DATE]
 Best:          34w 1d     Det. By:  U/S    ([DATE])        EDD:   [DATE]
Anatomy
 Cranium:          Previously seen        Aortic Arch:      Previously seen
 Fetal Cavum:      Previously seen        Ductal Arch:      Previously seen
 Ventricles:       Previously seen        Diaphragm:        Previously seen
 Choroid Plexus:   Previously seen        Stomach:          Appears normal, left
                                                            sided
 Cerebellum:       Previously seen        Abdomen:          Previously seen
 Posterior Fossa:  Previously seen        Abdominal Wall:   Previously seen
 Nuchal Fold:      Previously seen        Cord Vessels:     Previously seen
 Face:             Orbits and profile     Kidneys:          Appear normal
                   previously seen
 Lips:             Unilateral right       Bladder:          Appears normal
                   cleft lip
 Palate:           Cleft palate           Spine:            Previously seen
                   suspected
 Heart:            Previously seen        Lower             Clubfeet
                                          Extremities:
 RVOT:             Previously seen        Upper             Previously seen
 LVOT:             Previously seen        Limbs:            Bilateral clubbed
                                                            feet
 Other:  Male gender. 5th digit previously seen.
Doppler - Fetal Vessels
 Umbilical Artery
 S/D:   1.89           11  %tile       RI:
 PI:    0.63                           PSV:       52.43   cm/s
Cervix Uterus Adnexa
 Cervical Length:    3.6      cm
 Cervix:       Normal appearance by transabdominal scan. Appears
               closed, without funnelling.
Impression
INDICATION: 21 yr old G1P0 at [YP] with symmetrically
 lagging fetal growth and marginal placenta previa for follow
 up ultrasound and biophysical profile.

[Series 1: us ob transvaginal · 0.23mm/px · 15 of 63 slices shown]
[im 1/63]
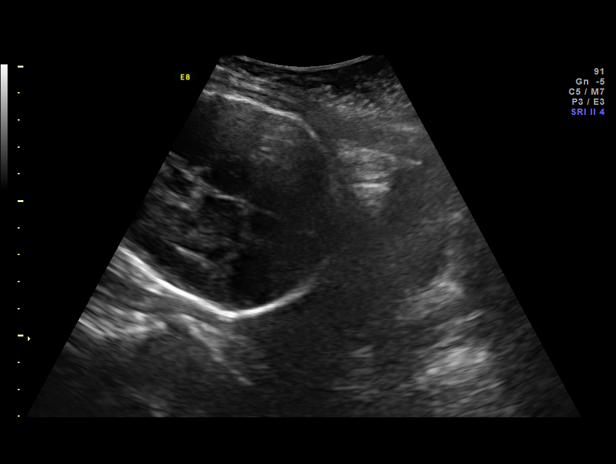
[im 5/63]
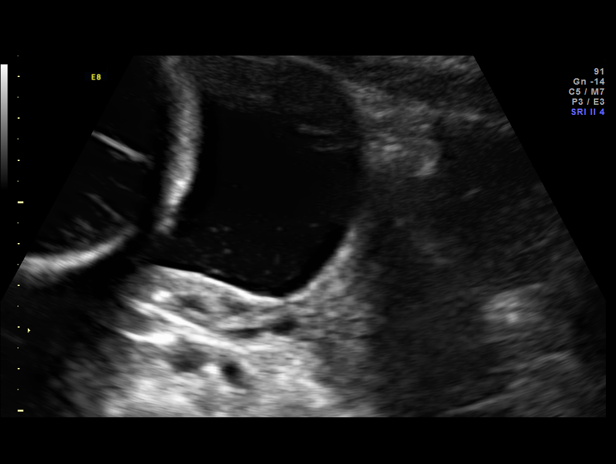
[im 10/63]
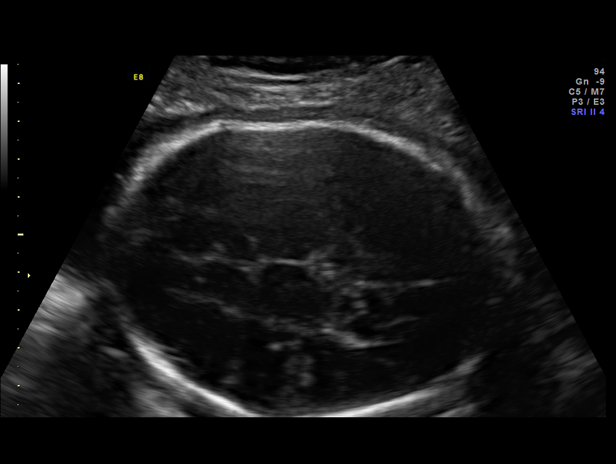
[im 14/63]
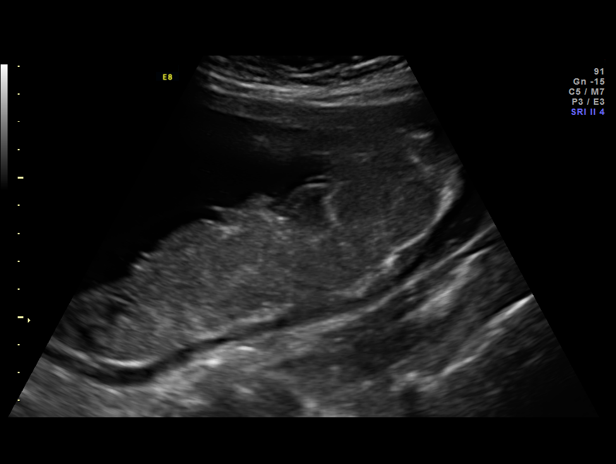
[im 19/63]
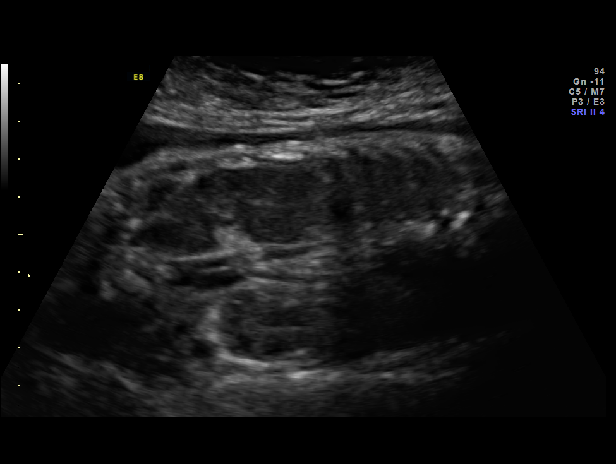
[im 23/63]
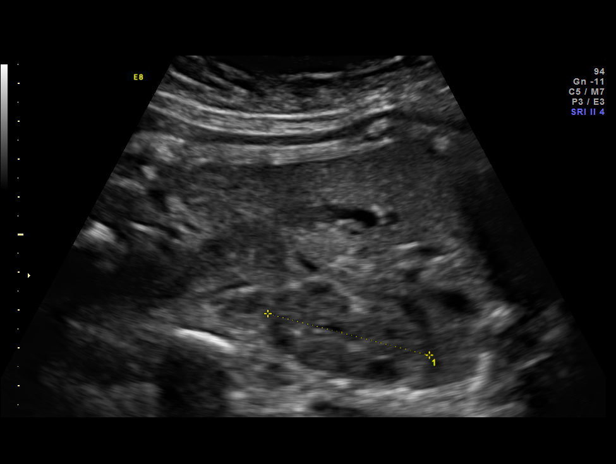
[im 28/63]
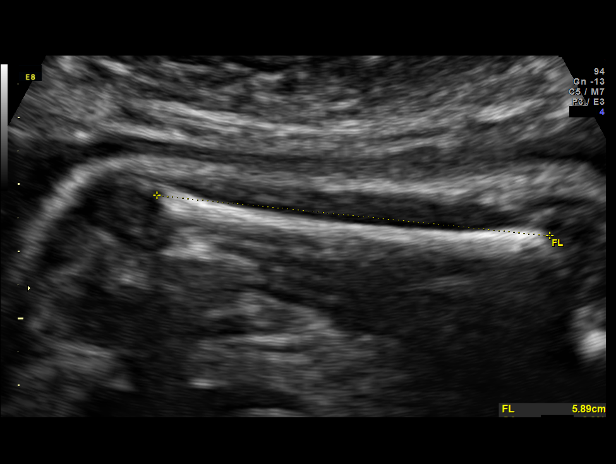
[im 33/63]
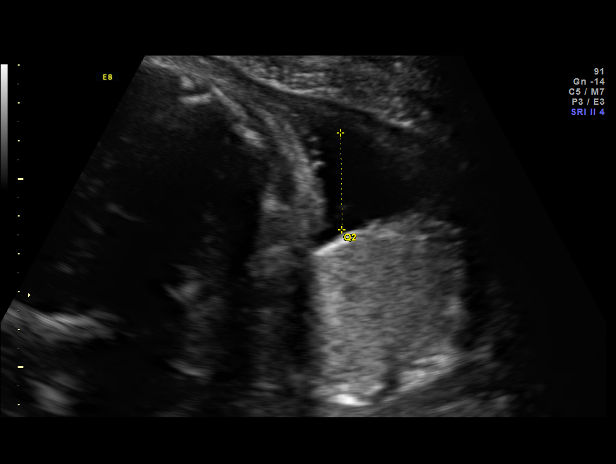
[im 35/63]
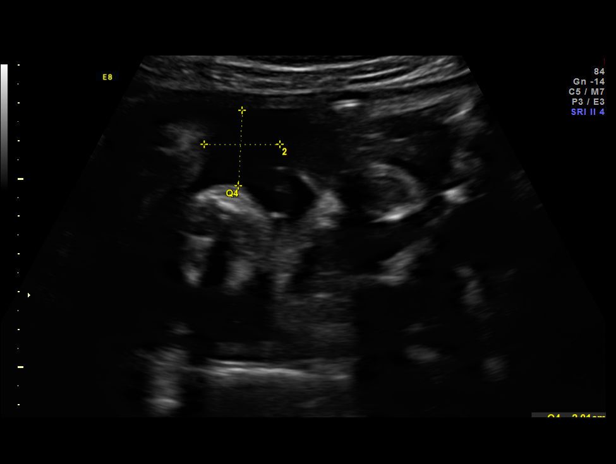
[im 40/63]
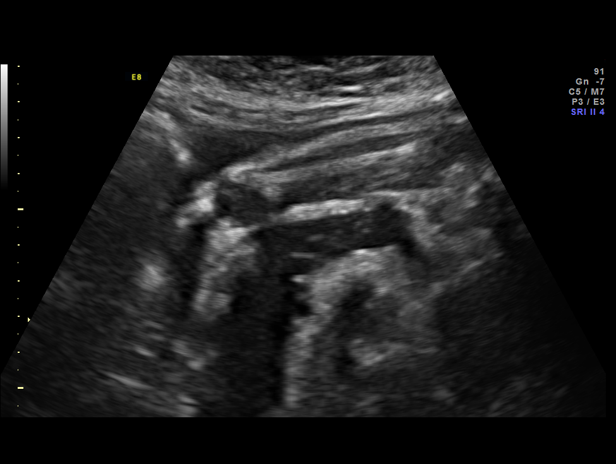
[im 44/63]
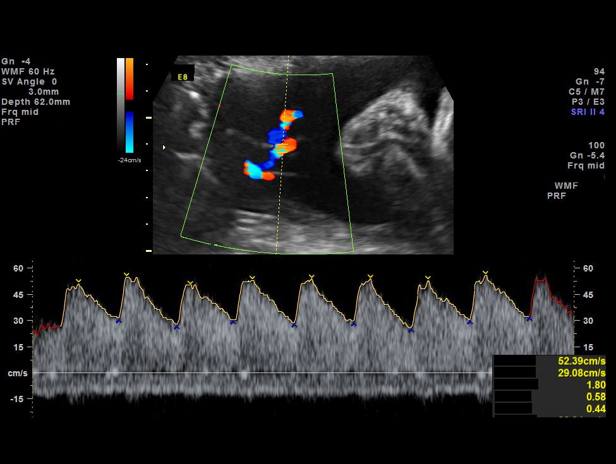
[im 49/63]
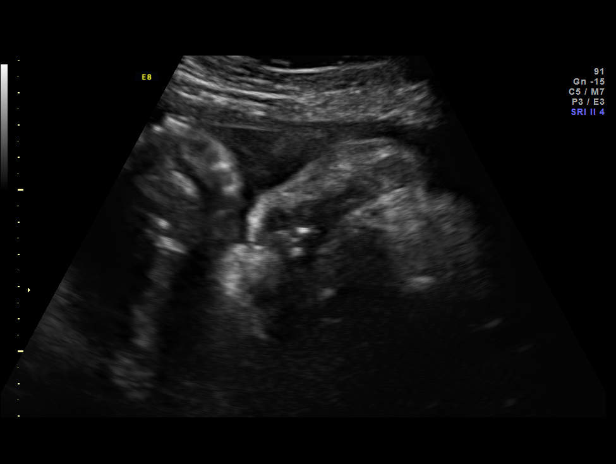
[im 53/63]
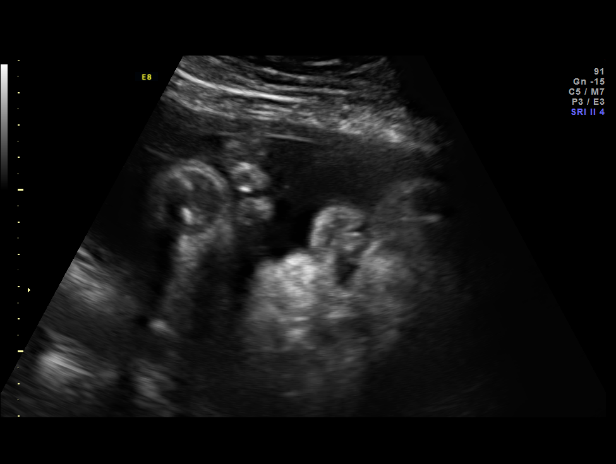
[im 58/63]
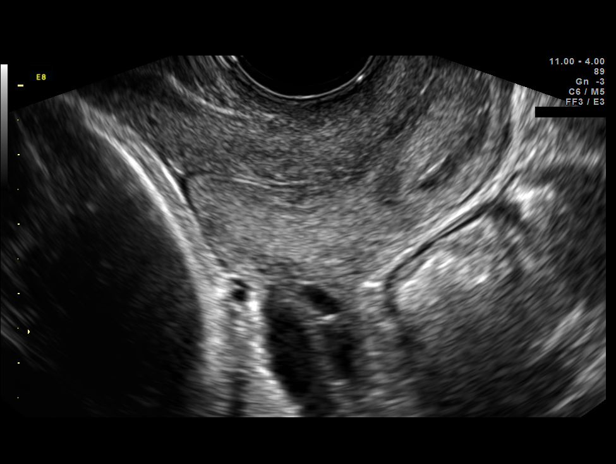
[im 63/63]
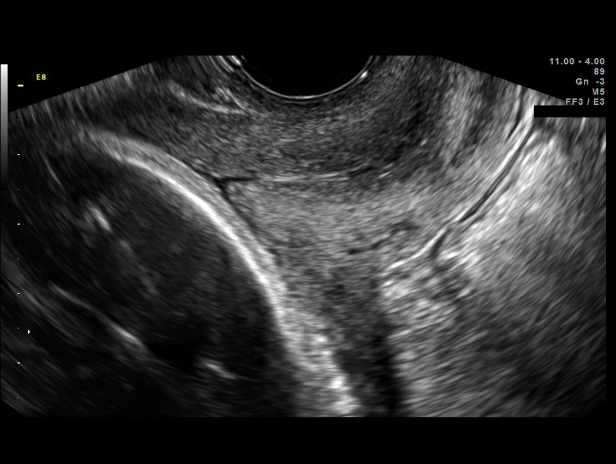

[15 of 28 positions shown; findings below may reference images not displayed]

FINDINGS: 1. Single intrauterine pregnancy.
 2. Estimated fetal weight is in the 22nd%. The biometry is
 symmetric. The abdominal circumference is in the 17th%.
 3. Posterior placenta with marginal previa. The placental
 edge is 1.7cm from the internal cervical os.
 4. Normal amniotic fluid volume.
 5. Normal transvaginal cervical length.
 6. Again seen is a cleft lip and bilateral club feet. The palate
 is not well visualized on today's ultrasound.
 7. The remainder of the limited anatomy survey is normal.
 8. Normal biophysical profile of [DATE].
 9. Normal umbilical artery Doppler studies.
Recommendations

 1. Appropriate interval fetal growth. Overall percentile is
 normal. Abdominal circumference is now normal. Long bones
 continue to lag behind dates but overall the biometry is
 symmetric.
 2. Given good interval growth and normal Doppler studies
 this is likely constitutional vs genetic condition. Placental
 insufficiency is less likely.
 - Recommend continue weekly biophysical profiles, but given
 normal fetal growth percentile and abdominal circumference
 do not feel need to continue Doppler studies.
 3. Cleft lip and possibly palate:
 - previously counseled
 - had normal [facility] screen
 - to meet with Pediatric Surgery if hasn't already- will arrange
 if not already scheduled
 4. Club feet:
 - previously counseled
 - needs to meet with Pediatric Orthopedics if hasn't already-
 will arrange if not already scheduled
 5. Marginal placenta previa:
 - bleeding precautions reviewed
 - will reevalaute placental location in 2 weeks
 - if placental edge remains <2cm from the internal cervical os
 recommend delivery via C section
 6. Fetal growth in 3 weeks

 questions or concerns.

## 2012-04-12 NOTE — Progress Notes (Signed)
[facility]  Indication: 21 yr old G1P0 at [redacted]w[redacted]d with symmetrically lagging fetal growth and marginal placenta previa for follow up ultrasound and biophysical profile.  Findings: 1. Single intrauterine pregnancy. 2. Estimated fetal weight is in the 22nd%. The biometry is symmetric. The abdominal circumference is in the 17th%. 3. Posterior placenta with marginal previa. The placental edge is 1.7cm from the internal cervical os. 4. Normal amniotic fluid volume. 5. Normal transvaginal cervical length. 6. 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 The palate is not well visualized on today's ultrasound. 7. The remainder of the limited anatomy survey is normal. 8. Normal biophysical profile of 8/8. 9. Normal umbilical artery Doppler studies.    Recommendations: 1. Appropriate interval fetal growth. Overall percentile is normal. Abdominal circumference is now normal. Long bones continue to lag behind dates but overall the biometry is symmetric. 2. Given good interval growth and normal Doppler studies this is likely constitutional vs genetic condition. Placental insufficiency is less likely.  - Recommend continue weekly biophysical profiles, but given normal fetal growth percentile and abdominal circumference do not feel need to continue Doppler studies. 3. Cleft lip and possibly palate: - previously counseled - had normal [facility] screen - to meet with Pediatric Surgery if hasn't already- will arrange if not already scheduled 4. Club feet: - previously counseled - needs to meet with Pediatric Orthopedics if hasn't already- will arrange if not already scheduled 5. Marginal placenta previa: - bleeding precautions reviewed - will reevalaute placental location in 2 weeks - if placental edge remains <2cm from the internal cervical os recommend delivery via C section 6. Fetal growth in 3 weeks  Eulis Foster, MD

## 2012-04-17 LAB — OB RESULTS CONSOLE ABO/RH

## 2012-04-17 LAB — OB RESULTS CONSOLE ANTIBODY SCREEN: Antibody Screen: NEGATIVE

## 2012-04-17 LAB — OB RESULTS CONSOLE RUBELLA ANTIBODY, IGM: Rubella: IMMUNE

## 2012-04-19 ENCOUNTER — Ambulatory Visit (HOSPITAL_COMMUNITY)
Admission: RE | Admit: 2012-04-19 | Discharge: 2012-04-19 | Disposition: A | Discharge status: Home / Self Care | Payer: Medicaid Other | Source: Ambulatory Visit | Attending: Obstetrics | Admitting: Obstetrics

## 2012-04-19 ENCOUNTER — Other Ambulatory Visit (HOSPITAL_COMMUNITY): Payer: Self-pay | Admitting: Maternal and Fetal Medicine

## 2012-04-19 DIAGNOSIS — O4400 Placenta previa specified as without hemorrhage, unspecified trimester: Secondary | ICD-10-CM | POA: Insufficient documentation

## 2012-04-19 DIAGNOSIS — Q6689 Other  specified congenital deformities of feet: Secondary | ICD-10-CM | POA: Insufficient documentation

## 2012-04-19 DIAGNOSIS — Q369 Cleft lip, unilateral: Secondary | ICD-10-CM | POA: Insufficient documentation

## 2012-04-19 DIAGNOSIS — O4440 Low lying placenta NOS or without hemorrhage, unspecified trimester: Secondary | ICD-10-CM

## 2012-04-19 DIAGNOSIS — Q6601 Congenital talipes equinovarus, right foot: Secondary | ICD-10-CM

## 2012-04-19 IMAGING — US US FETAL BPP W/O NONSTRESS
1 series · 13 of 20 positions shown · non-contrast
Comparison: none

[Series 1: us fetal bpp w/o nonstress · 0.21mm/px · 20 acquisitions, 13 frames shown]
[im 1/20]
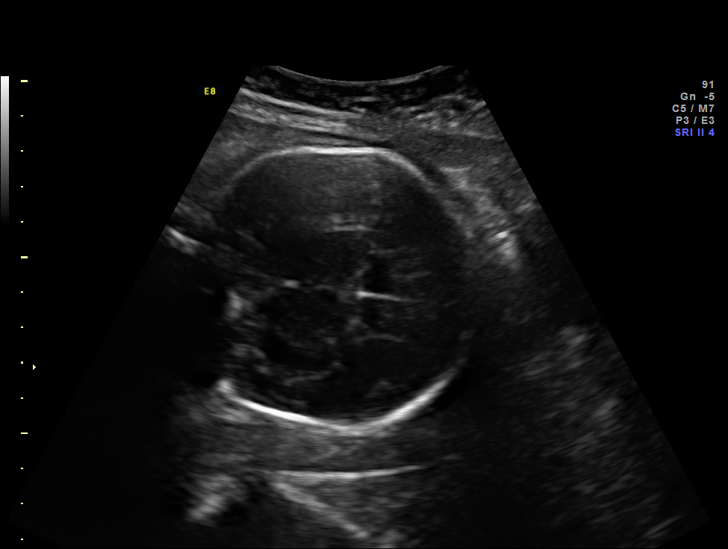
[im 3/20]
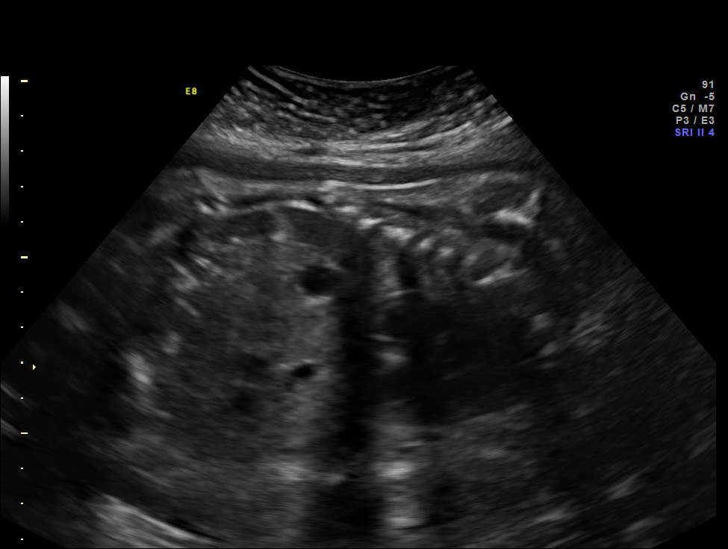
[im 4/20]
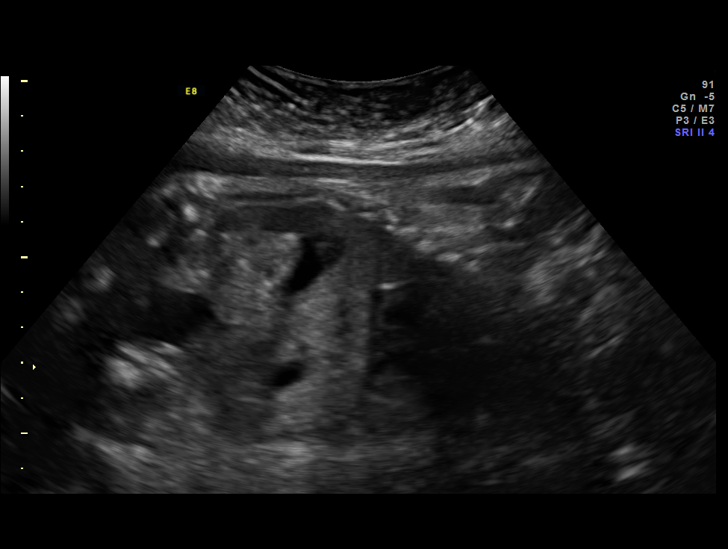
[im 6/20]
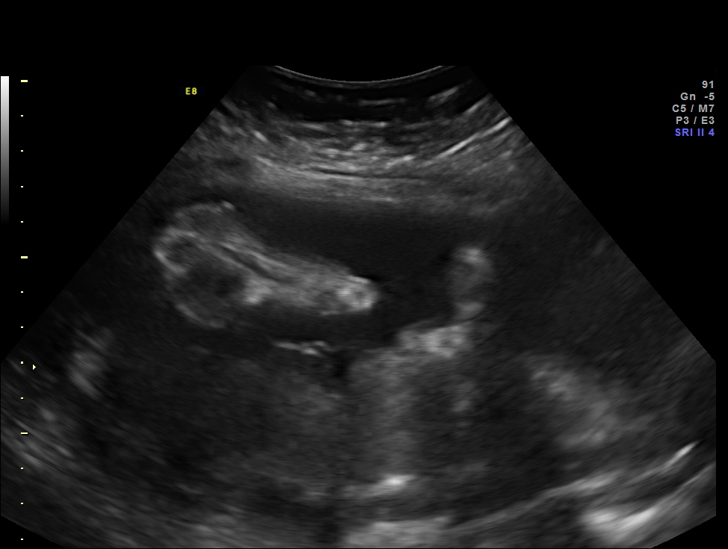
[im 7/20]
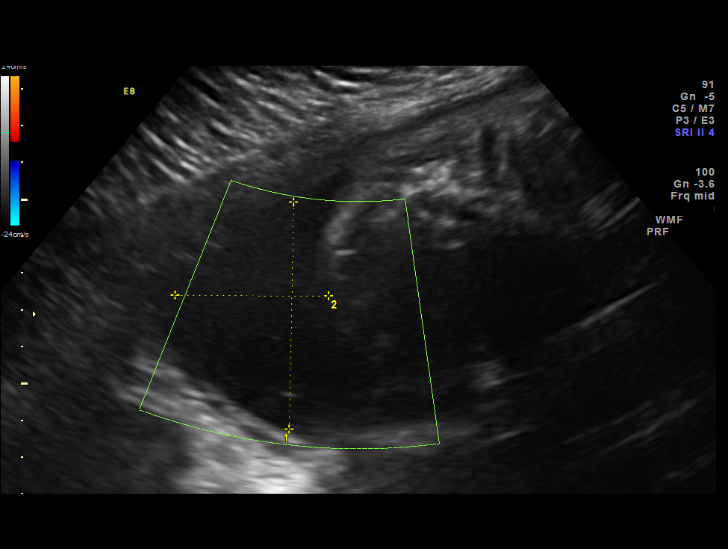
[im 9/20]
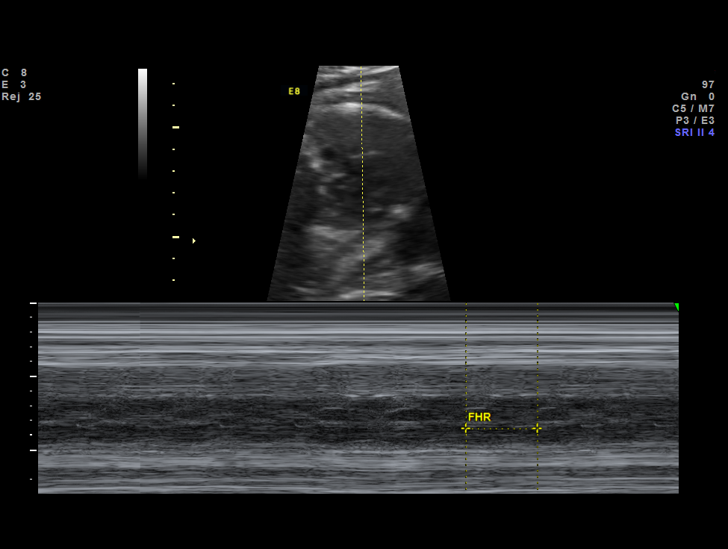
[im 11/20]
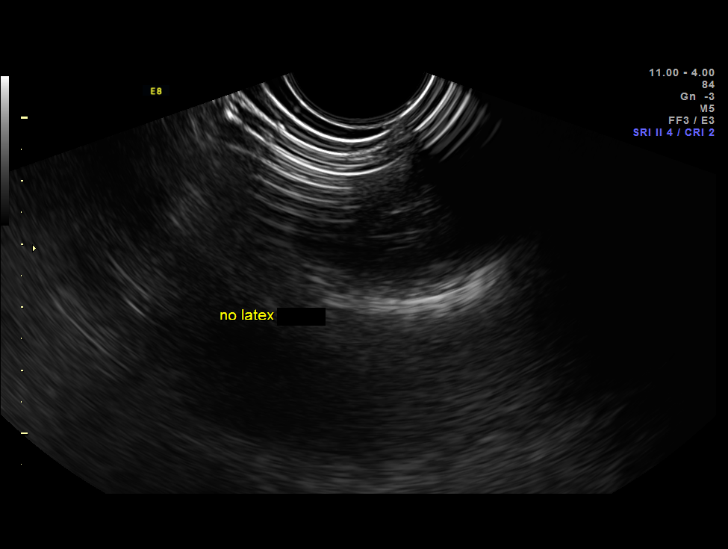
[im 12/20]
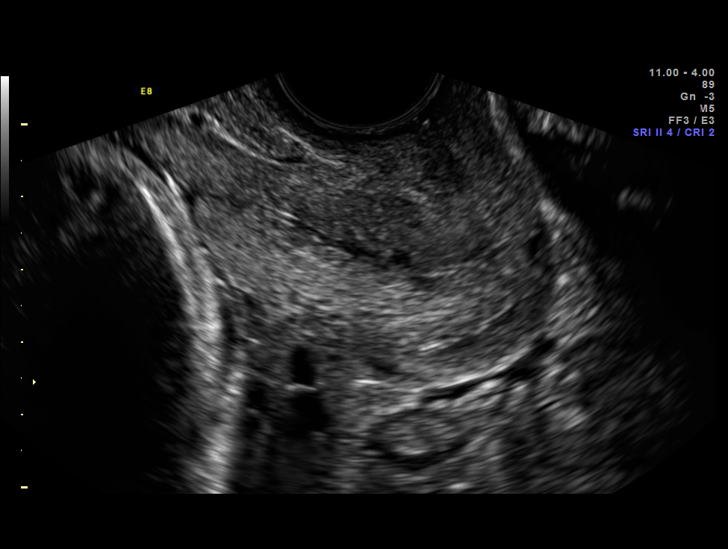
[im 14/20]
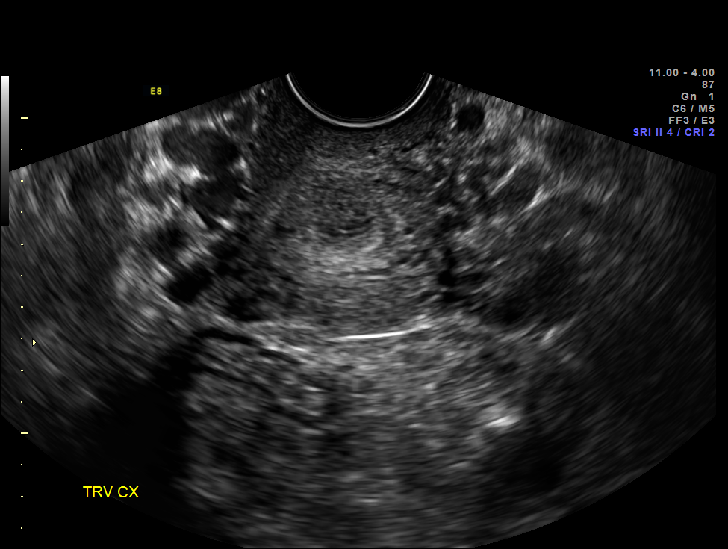
[im 15/20]
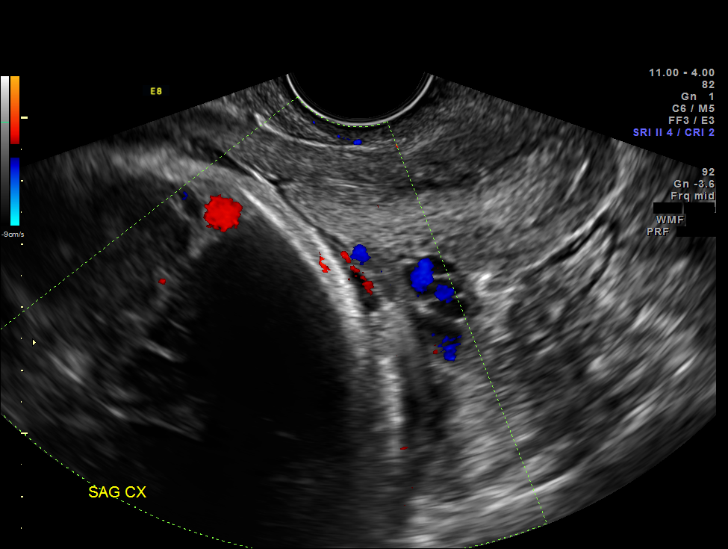
[im 17/20]
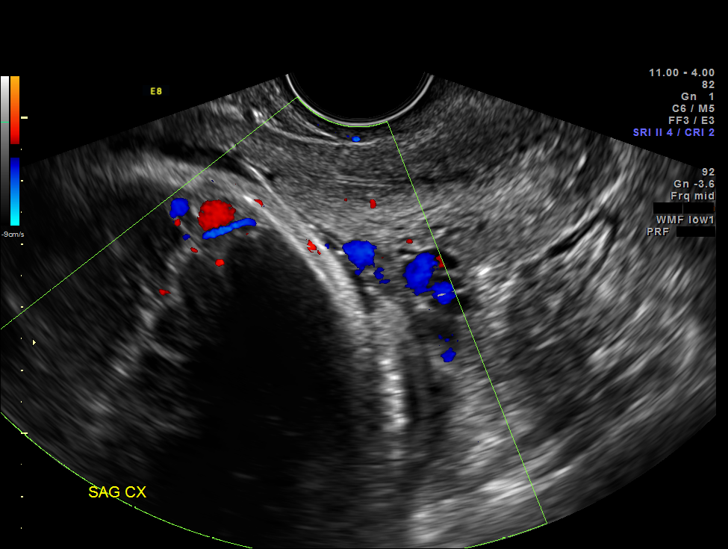
[im 18/20]
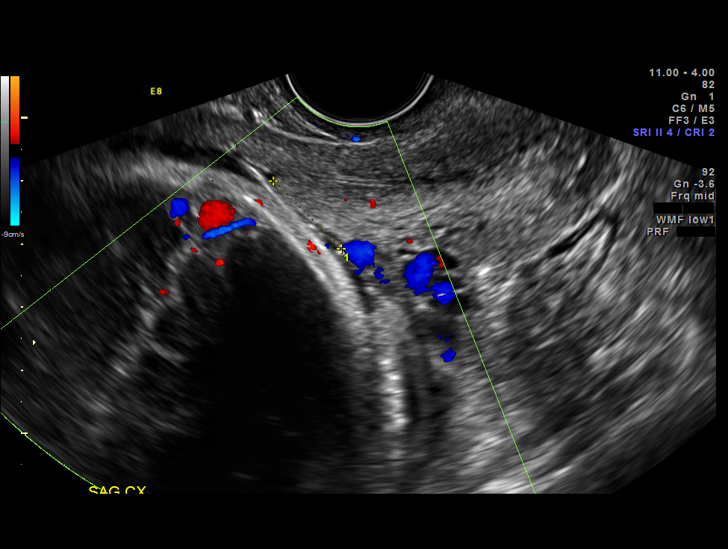
[im 20/20]
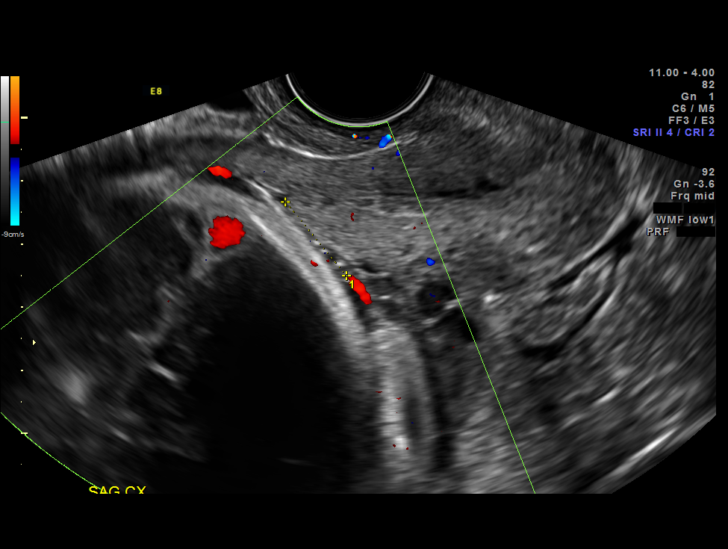

[13 of 20 positions shown; findings below may reference images not displayed]

OBSTETRICS REPORT
                      (Signed Final [DATE] [DATE])

Service(s) Provided

 US OB TRANSVAGINAL                                    76817.0
Indications

 Size less than dates (Small for gestational [AGE]
 FGR)
 Cleft lip, unilateral
 Placenta previa/Low lying: No bleeding
 Cigarette smoker
 Assess fetal well being
Fetal Evaluation

 Num Of Fetuses:    1
 Fetal Heart Rate:  141                          bpm
 Cardiac Activity:  Observed
 Presentation:      Cephalic
 Placenta:          Posterior, low-lying,
                    cm from int os
                                             Larg Pckt:    6.17  cm
Biophysical Evaluation

 Amniotic F.V:   Pocket => 2 cm two         F. Tone:        Observed
                 planes
 F. Movement:    Observed                   Score:          [DATE]
 F. Breathing:   Observed
Gestational Age

 LMP:           37w 2d        Date:  [DATE]                 EDD:   [DATE]
 Best:          35w 1d     Det. By:  U/S    ([DATE])        EDD:   [DATE]
Cervix Uterus Adnexa

 Cervical Length:    4.37     cm

 Cervix:       Normal appearance by transvaginal scan
Impression

 Single living intrauterine pregnancy at 35 weeks 1 day.
 Normal amniotic fluid.
 BPP [DATE].
 Low-lying placenta 1.5 cm from the internal os.
Recommendations

 Recommend cesarean delivery at approximately 39 weeks or
 earlier if labor ensues.
 Repeat ultrasound could be considered at approximately 39
 weeks to assess for further placental migration.  If placenata
 is greater than 2.0 cm from internal os a vaginal delivery can
 be attempted.

 questions or concerns.
                NUSHIRAVAN

## 2012-04-19 NOTE — Progress Notes (Signed)
Sharon [name] was seen for ultrasound appointment today.  Please see [facility] report for details.

## 2012-04-24 ENCOUNTER — Other Ambulatory Visit: Payer: Self-pay | Admitting: Obstetrics

## 2012-04-24 DIAGNOSIS — IMO0002 Reserved for concepts with insufficient information to code with codable children: Secondary | ICD-10-CM

## 2012-04-26 ENCOUNTER — Ambulatory Visit (HOSPITAL_COMMUNITY)
Admission: RE | Admit: 2012-04-26 | Discharge: 2012-04-26 | Disposition: A | Discharge status: Home / Self Care | Payer: Medicaid Other | Source: Ambulatory Visit | Attending: Obstetrics | Admitting: Obstetrics

## 2012-04-26 ENCOUNTER — Encounter (HOSPITAL_COMMUNITY): Payer: Self-pay

## 2012-04-26 VITALS — BP 125/84 | HR 100 | Wt 192.2 lb

## 2012-04-26 DIAGNOSIS — O4400 Placenta previa specified as without hemorrhage, unspecified trimester: Secondary | ICD-10-CM | POA: Insufficient documentation

## 2012-04-26 DIAGNOSIS — O365990 Maternal care for other known or suspected poor fetal growth, unspecified trimester, not applicable or unspecified: Secondary | ICD-10-CM | POA: Insufficient documentation

## 2012-04-26 DIAGNOSIS — IMO0002 Reserved for concepts with insufficient information to code with codable children: Secondary | ICD-10-CM

## 2012-04-26 DIAGNOSIS — O358XX0 Maternal care for other (suspected) fetal abnormality and damage, not applicable or unspecified: Secondary | ICD-10-CM | POA: Insufficient documentation

## 2012-04-26 DIAGNOSIS — O99330 Smoking (tobacco) complicating pregnancy, unspecified trimester: Secondary | ICD-10-CM | POA: Insufficient documentation

## 2012-04-26 IMAGING — US US FETAL BPP W/O NONSTRESS
1 series · 9 of 9 positions shown · non-contrast
Comparison: none

OBSTETRICS REPORT
                      (Signed Final [DATE] [DATE])

Service(s) Provided
Indications
 Size less than dates (Small for gestational [AGE]
 FGR)
 Cleft lip, unilateral
 Placenta previa/Low lying: No bleeding
 Cigarette smoker
 Assess fetal well being
 Club feet
Fetal Evaluation
 Num Of Fetuses:    1
 Fetal Heart Rate:  131                          bpm
 Cardiac Activity:  Observed
 Presentation:      Cephalic
 Placenta:          Posterior
 P. Cord            Previously Visualized
 Insertion:
 Comment:    BPP [DATE] in  13 minutes.
 Amniotic Fluid
 AFI FV:      Subjectively within normal limits
                                             Larg Pckt:     4.2  cm
Biophysical Evaluation
 Amniotic F.V:   Pocket => 2 cm two         F. Tone:        Observed
                 planes
 F. Movement:    Observed                   Score:          [DATE]
 F. Breathing:   Observed
Gestational Age
 LMP:           38w 2d        Date:  [DATE]                 EDD:   [DATE]
 Best:          36w 1d     Det. By:  U/S    ([DATE])        EDD:   [DATE]
Impression
INDICATION: 21 yr old G1P0 at [HY] with fetus with cleft
 lip/palate and club feet and symmetrically lagging fetal growth
 (improved on last ultrasound) and marginal placenta previa
 for biophysical profile.

[Series 1: us fetal bpp w/o nonstress · 0.23mm/px · 9 acquisitions, 9 frames shown]
[im 1/9]
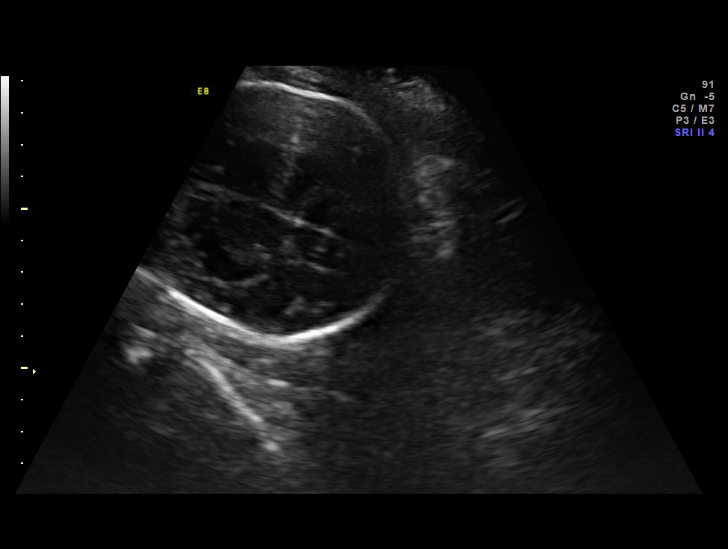
[im 2/9]
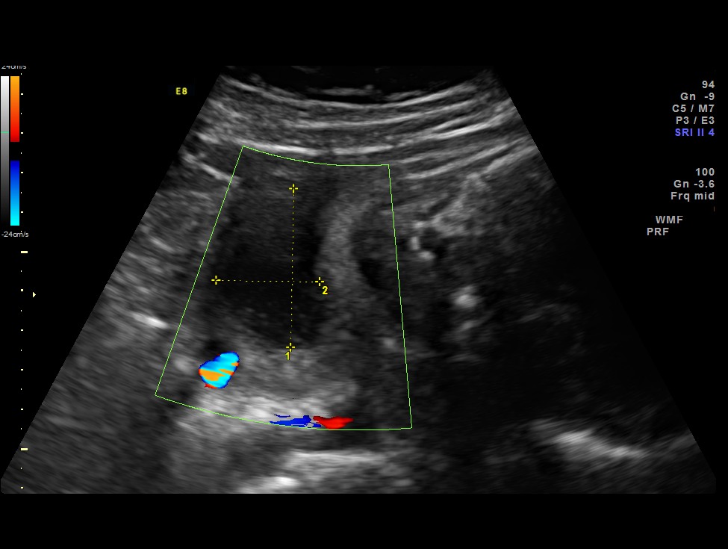
[im 3/9]
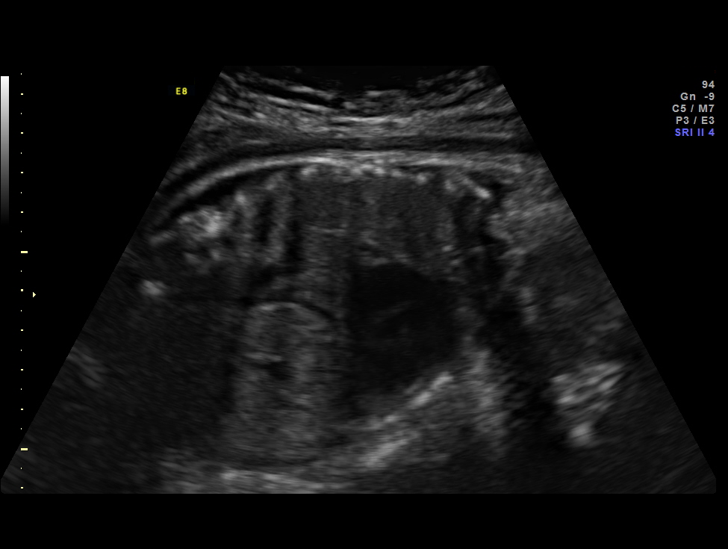
[im 4/9]
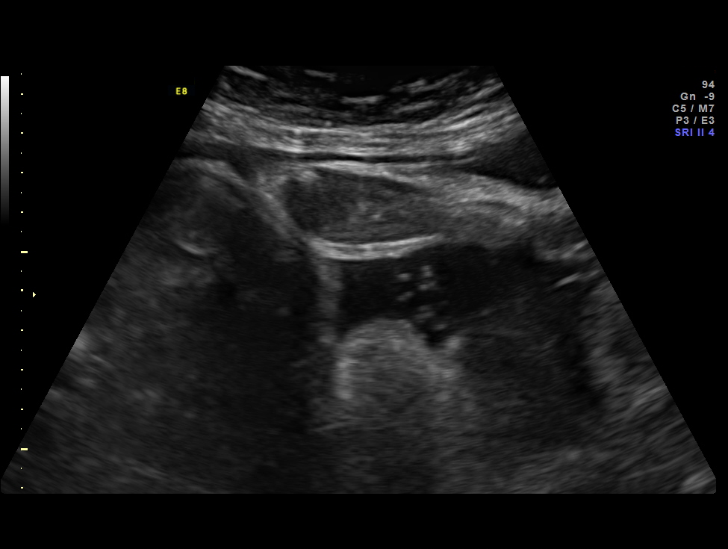
[im 5/9]
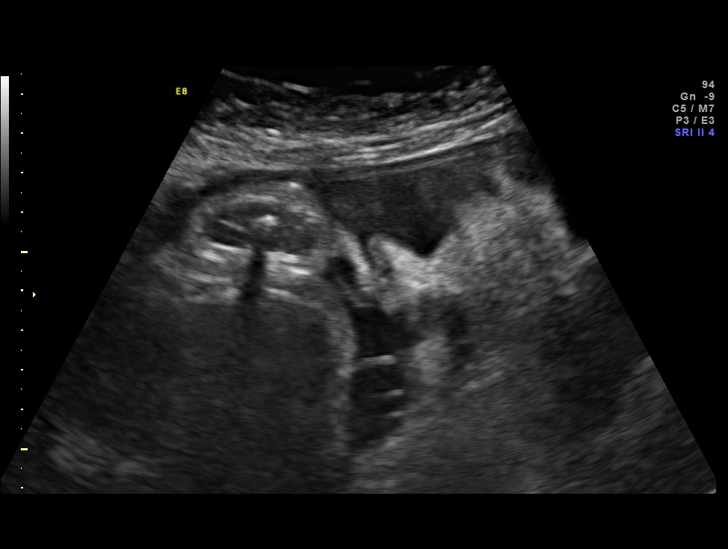
[im 6/9]
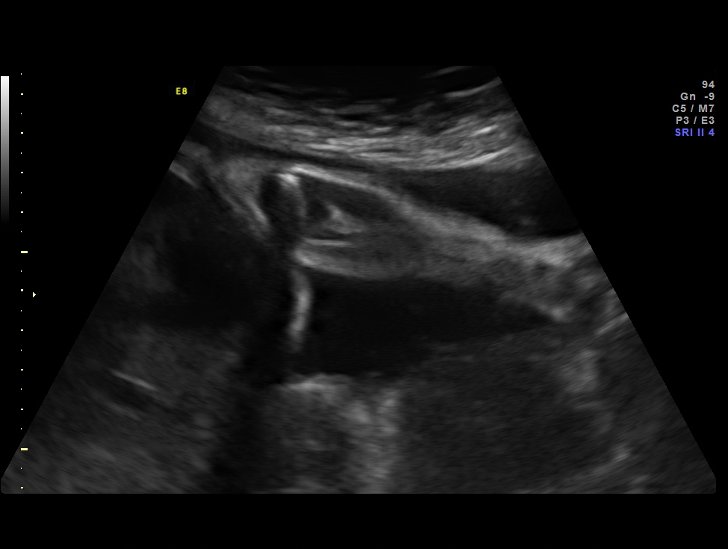
[im 7/9]
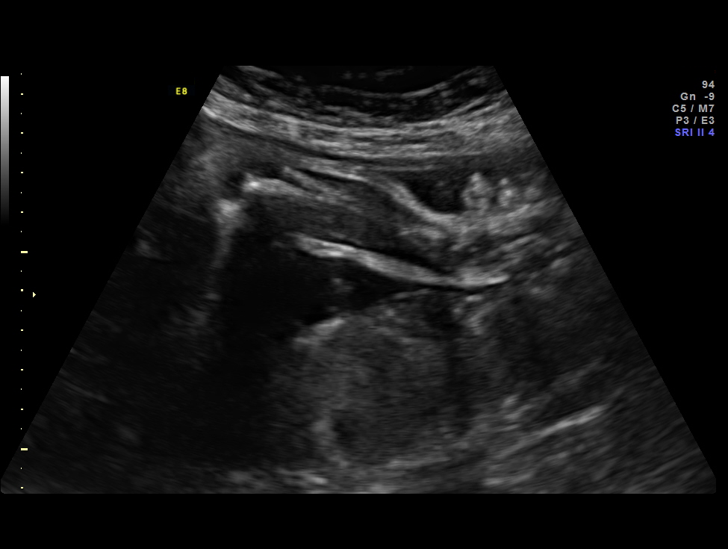
[im 8/9]
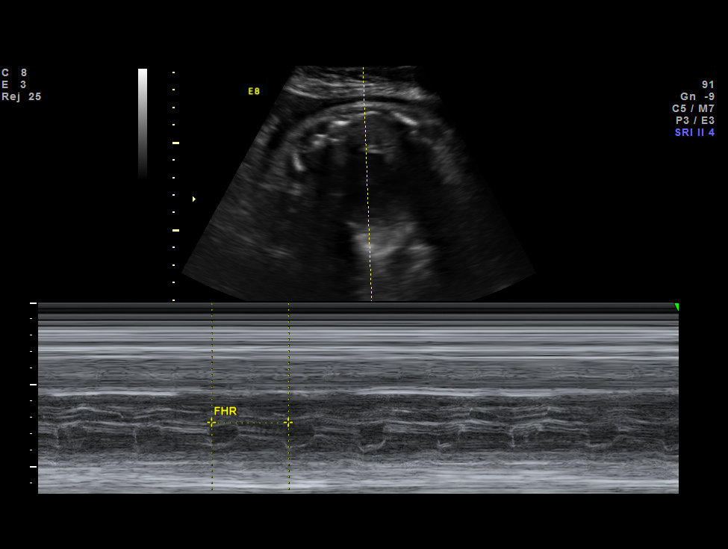
[im 9/9]
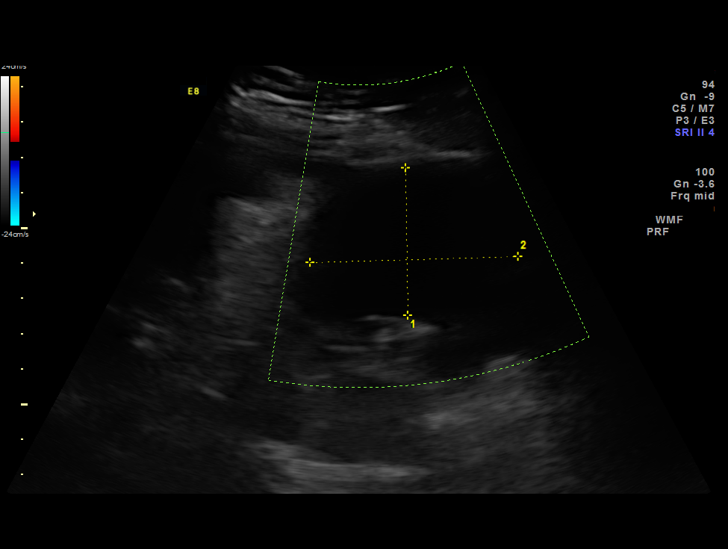

[9 of 9 positions shown; findings below may reference images not displayed]

FINDINGS: 1. Single intrauterine pregnancy.
 2. Posterior placenta with marginal previa.
 3. Normal amniotic fluid volume.
 4. Normal biophysical profile of [DATE].
Recommendations

 1. Recent fetal growth normal.
 2. Recommend continue weekly biophysical profiles, but
 given recent normal fetal growth percentile and abdominal
 circumference do not feel need to continue Doppler studies.
 3. Cleft lip and possibly palate:
 - previously counseled
 - had normal [facility] screen
 - to meet with Pediatric Surgery if hasn't already- will arrange
 if not already scheduled
 4. Club feet:
 - previously counseled
 - needs to meet with Pediatric Orthopedics if hasn't already-
 will arrange if not already scheduled
 5. Marginal placenta previa:
 - bleeding precautions reviewed
 - will reevalaute placental location in 2 weeks
 - if placental edge remains <2cm from the internal cervical os
 recommend delivery via C section
 6. Fetal growth in 1 week

 questions or concerns.

## 2012-04-26 NOTE — Progress Notes (Signed)
[facility]  Indication: 21 yr old G1P0 at 109w1d with fetus with cleft lip/palate and club feet and symmetrically lagging fetal growth (improved on last ultrasound) and marginal placenta previa for biophysical profile.  Findings:  1. Single intrauterine pregnancy.  2. Posterior placenta with marginal previa.  3. Normal amniotic fluid volume.  4. Normal biophysical profile of 8/8.   Recommendations: 1. Recent fetal growth normal.  2. Recommend continue weekly biophysical profiles, but given recent normal fetal growth percentile and abdominal circumference do not feel need to continue Doppler studies.  3. Cleft lip and possibly palate:  - previously counseled  - had normal [facility] screen  - to meet with Pediatric Surgery if hasn't already- will arrange if not already scheduled  4. Club feet:  - previously counseled  - needs to meet with Pediatric Orthopedics if hasn't already- will arrange if not already scheduled  5. Marginal placenta previa:  - bleeding precautions reviewed  - will reevalaute placental location in 2 weeks  - if placental edge remains <2cm from the internal cervical os recommend delivery via C section  6. Fetal growth in 1 week  Eulis Foster, MD

## 2012-05-03 ENCOUNTER — Other Ambulatory Visit (HOSPITAL_COMMUNITY): Payer: Self-pay | Admitting: Obstetrics and Gynecology

## 2012-05-03 ENCOUNTER — Ambulatory Visit (HOSPITAL_COMMUNITY)
Admission: RE | Admit: 2012-05-03 | Discharge: 2012-05-03 | Disposition: A | Discharge status: Home / Self Care | Payer: Medicaid Other | Source: Ambulatory Visit | Attending: Obstetrics | Admitting: Obstetrics

## 2012-05-03 DIAGNOSIS — O99330 Smoking (tobacco) complicating pregnancy, unspecified trimester: Secondary | ICD-10-CM | POA: Insufficient documentation

## 2012-05-03 DIAGNOSIS — IMO0002 Reserved for concepts with insufficient information to code with codable children: Secondary | ICD-10-CM

## 2012-05-03 DIAGNOSIS — O4400 Placenta previa specified as without hemorrhage, unspecified trimester: Secondary | ICD-10-CM | POA: Insufficient documentation

## 2012-05-03 DIAGNOSIS — O365990 Maternal care for other known or suspected poor fetal growth, unspecified trimester, not applicable or unspecified: Secondary | ICD-10-CM | POA: Insufficient documentation

## 2012-05-03 DIAGNOSIS — O358XX0 Maternal care for other (suspected) fetal abnormality and damage, not applicable or unspecified: Secondary | ICD-10-CM | POA: Insufficient documentation

## 2012-05-03 IMAGING — US US OB TRANSVAGINAL
1 series · 12 of 28 positions shown · non-contrast
Comparison: none

[Series 1: us ob transvaginal · 0.21mm/px · 12 of 48 slices shown]
[im 2/48]
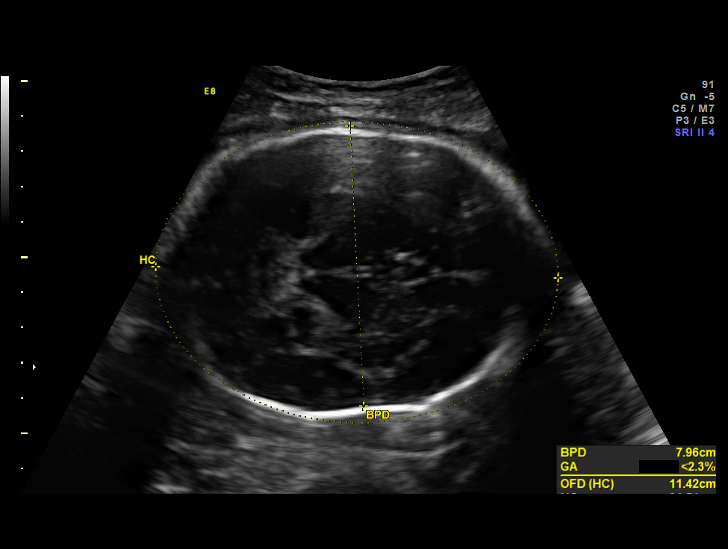
[im 6/48]
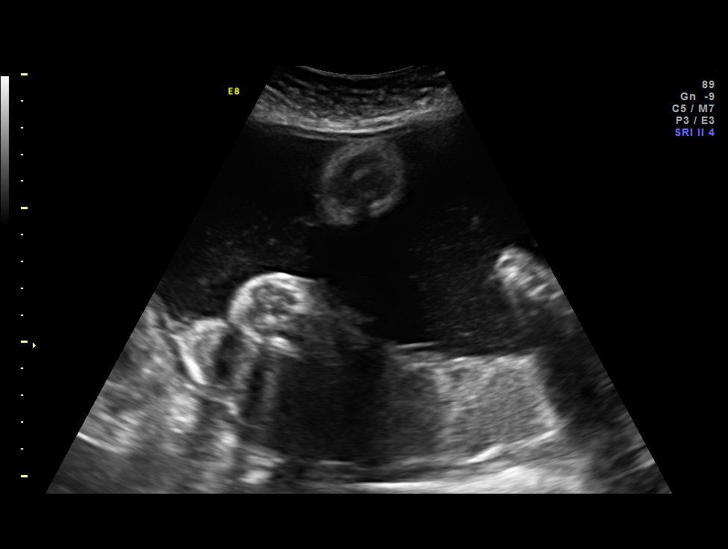
[im 9/48]
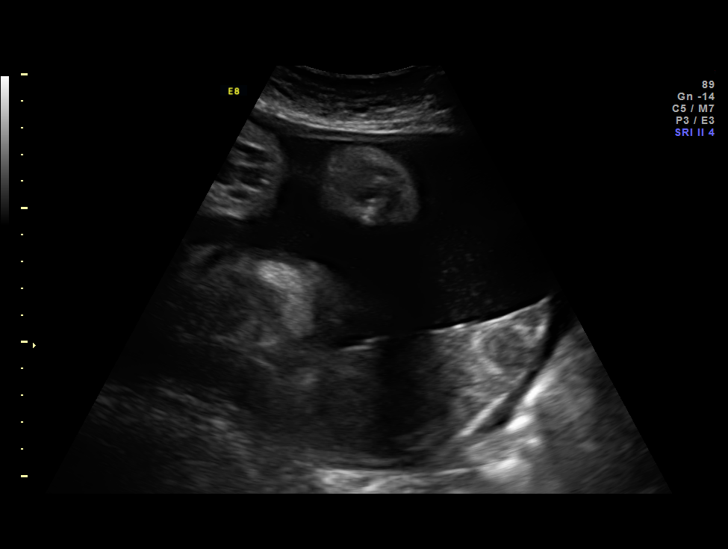
[im 14/48]
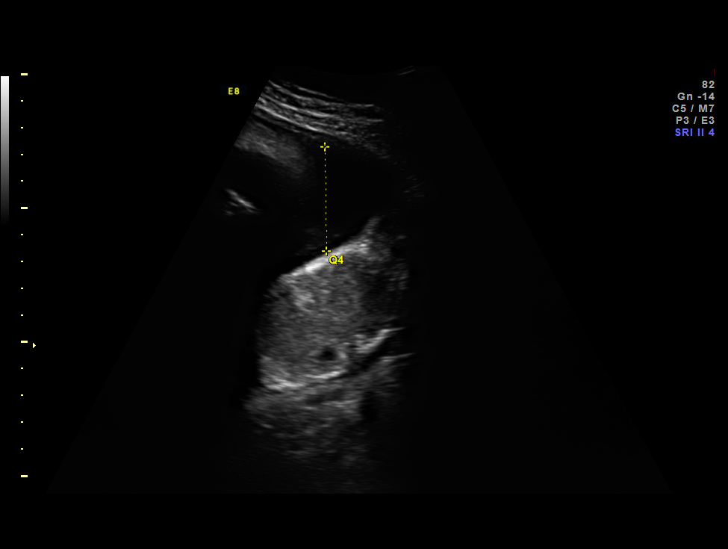
[im 18/48]
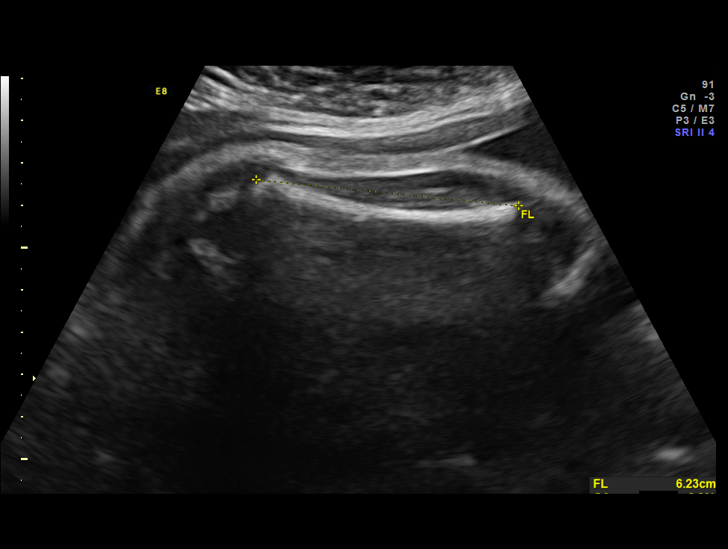
[im 21/48]
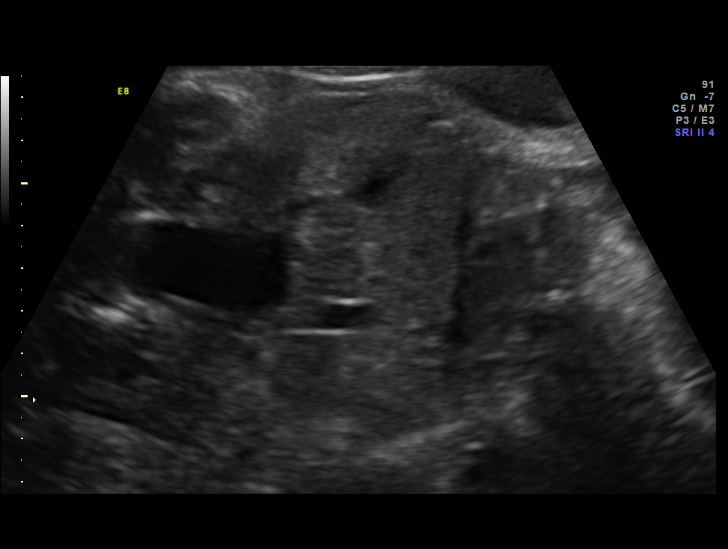
[im 27/48]
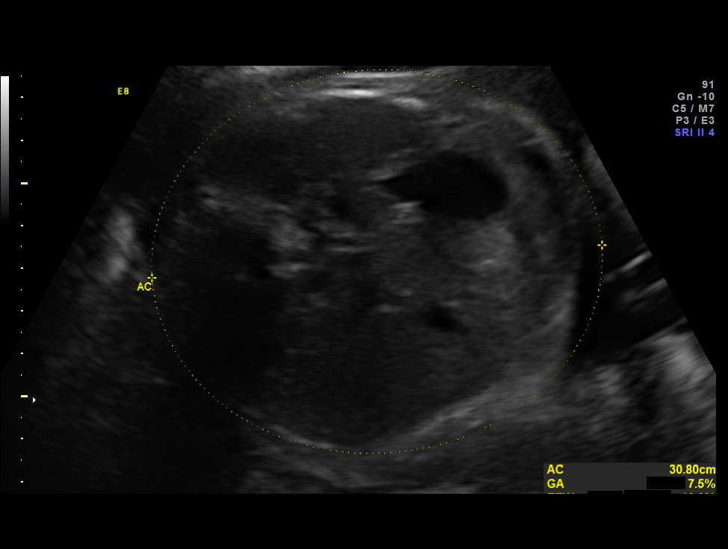
[im 30/48]
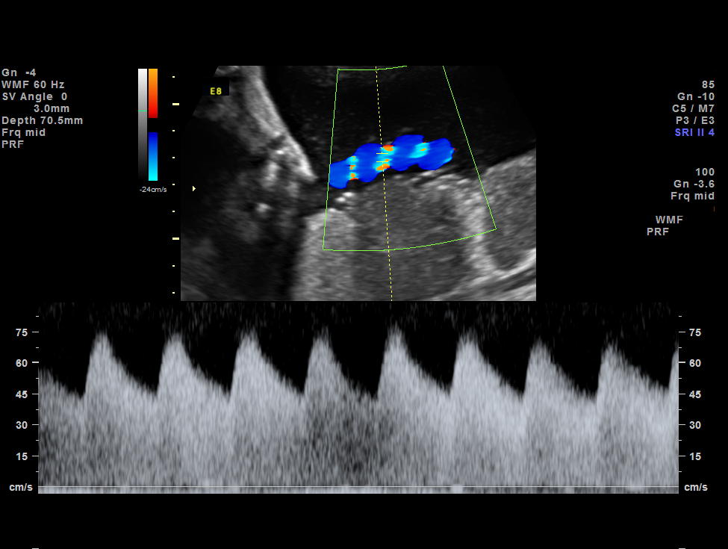
[im 34/48]
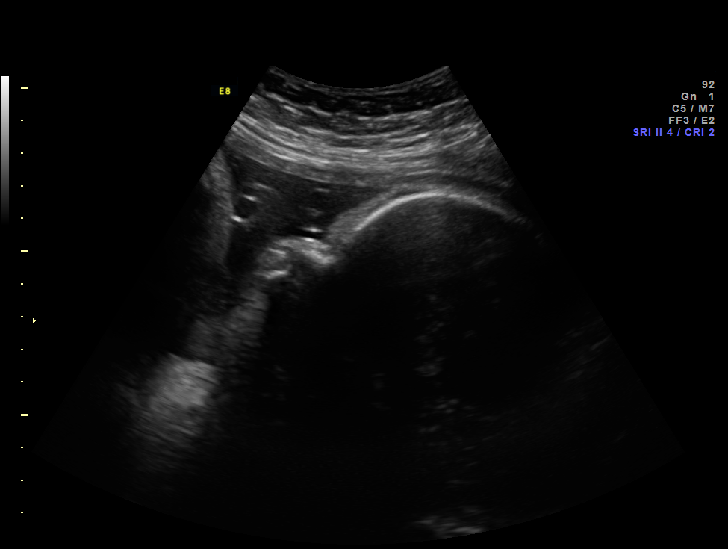
[im 39/48]
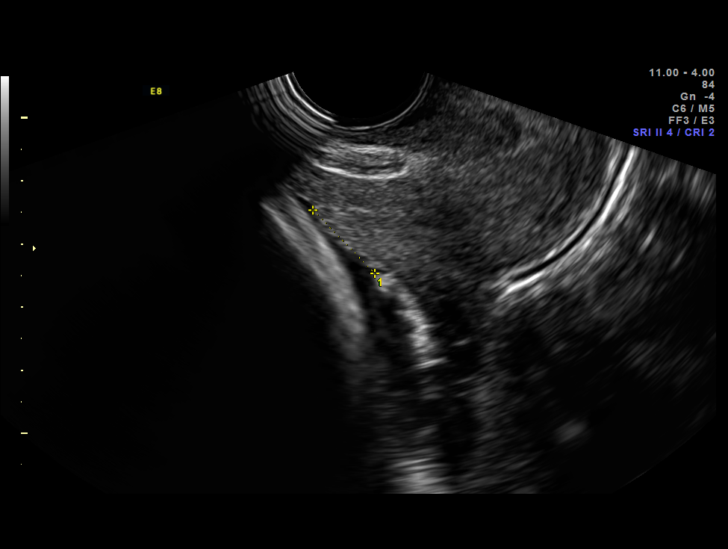
[im 42/48]
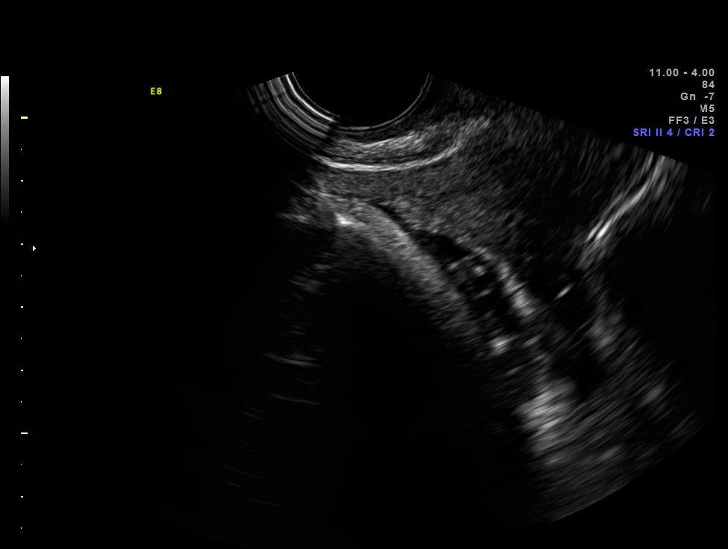
[im 46/48]
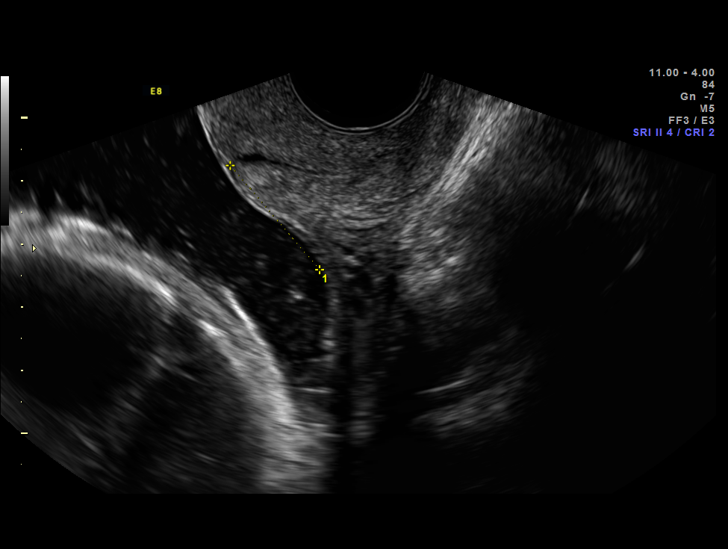

[12 of 28 positions shown; findings below may reference images not displayed]

OBSTETRICS REPORT
                      (Signed Final [DATE] [DATE])

Service(s) Provided

 US OB FOLLOW UP                                       76816.1
 US UA CORD DOPPLER                                    76820.0
 US OB TRANSVAGINAL                                    76817.0
Indications

 Size less than dates (Small for gestational [AGE]
 FGR)
 Cleft lip, unilateral
 Placenta previa/Low lying: No bleeding
 Cigarette smoker
 Assess fetal well being
 Club feet
Fetal Evaluation

 Num Of Fetuses:    1
 Fetal Heart Rate:  136                          bpm
 Cardiac Activity:  Observed
 Presentation:      Cephalic
 Placenta:          Posterior, low-lying,
                    cm from int os
 P. Cord            Previously Visualized
 Insertion:

 Amniotic Fluid
 AFI FV:      Subjectively within normal limits
 AFI Sum:     18.81   cm       72  %Tile     Larg Pckt:    6.66  cm
 RUQ:   6.66    cm   RLQ:    3.9    cm    LUQ:   3.82    cm   LLQ:    4.43   cm
Biophysical Evaluation

 Amniotic F.V:   Within normal limits       F. Tone:        Observed
 F. Movement:    Observed                   Score:          [DATE]
 F. Breathing:   Observed
Biometry

 BPD:     80.2  mm     G. Age:  32w 1d                CI:         68.8   70 - 86
 OFD:    116.6  mm                                    FL/HC:      19.5   20.8 -

 HC:     318.1  mm     G. Age:  35w 6d        6  %    HC/AC:      1.02   0.92 -

 AC:     311.3  mm     G. Age:  35w 0d       12  %    FL/BPD:     77.3   71 - 87
 FL:        62  mm     G. Age:  32w 1d      < 3  %    FL/AC:      19.9   20 - 24
 HUM:     55.9  mm     G. Age:  32w 4d      < 5  %

 Est. FW:    [3B]  gm      5 lb 3 oz     10  %
Gestational Age

 LMP:           39w 2d        Date:  [DATE]                 EDD:   [DATE]
 U/S Today:     33w 5d                                        EDD:   [DATE]
 Best:          37w 1d     Det. By:  U/S    ([DATE])        EDD:   [DATE]
Anatomy

 Cranium:          Previously seen        Aortic Arch:      Previously seen
 Fetal Cavum:      Previously seen        Ductal Arch:      Previously seen
 Ventricles:       Previously seen        Diaphragm:        Previously seen
 Choroid Plexus:   Previously seen        Stomach:          Appears normal, left
                                                            sided
 Cerebellum:       Previously seen        Abdomen:          Previously seen
 Posterior Fossa:  Previously seen        Abdominal Wall:   Previously seen
 Nuchal Fold:      Previously seen        Cord Vessels:     Previously seen
 Face:             Orbits and profile     Kidneys:          Appear normal
                   previously seen
 Lips:             Unilateral right       Bladder:          Appears normal
                   cleft lip
 Palate:           Cleft palate           Spine:            Previously seen
                   suspected
 Heart:            Previously seen        Lower             Clubfeet
                                          Extremities:
 RVOT:             Previously seen        Upper             Previously seen
                                          Extremities:
 LVOT:             Previously seen        Limbs:            Bilateral clubbed
                                                            feet

 Other:  Male gender. 5th digit previously seen.
Doppler - Fetal Vessels

 Umbilical Artery
 S/D:   1.68            9  %tile       RI:
 PI:    0.52                           PSV:       56.06   cm/s

Cervix Uterus Adnexa

 Cervix:       Not visualized (advanced GA >[3B])
Impression

 Single IUP at 37 [DATE] weeks
 Unilateral cleft lip+ / - palate
 Bilateral clubbed feet.
 Fetal growth restriction noted with the estimated fetal weight
 at the 10th %tile.  The AC measures at the 12th %tile.
 Active singleton fetus with BPP of [DATE]
 Umbilical artery Doppler studies within normal limits for
 gestational age
 Normal amniotic fluid volume

 TVUS - leading edge of the placenta 1.6 cm from the internal
 os (low lying placenta)
Recommendations

 Based on newly diagnosed fetal growth restriction with
 normal UA Dopplers - recommend delivery at 38-39 weeks
 gestation.
 With low lying placenta (< 2 cm from the os) recommend
 Cesarean delivery
 Continue 2x weekly testing until delivery

 questions or concerns.

## 2012-05-03 NOTE — Progress Notes (Signed)
Sharon [name]  was seen today for an ultrasound appointment.  See full report in [facility].  Impression: Single IUP at 37 1/7 weeks Unilateral cleft lip+/- palate Bilateral clubbed feet. Fetal growth restriction noted with the estimated fetal weight at the 10th %tile.  [facility] measures at the 12th %tile. Active singleton fetus with BPP of 8/8 Umbilical artery Doppler studies within normal limits for gestational age Normal amniotic fluid volume  TVUS - leading edge of the placenta 1.6 cm from the internal os (low lying placenta)  Recommendations: Based on newly diagnosed fetal growth restriction with normal UA Dopplers - recommend delivery at 38-[redacted] weeks gestation. With low lying placenta (< 2 cm from the os) recommend Cesarean delivery Continue 2x weekly testing until delivery  Alpha Gula, MD

## 2012-05-08 ENCOUNTER — Other Ambulatory Visit: Payer: Self-pay | Admitting: Obstetrics & Gynecology

## 2012-05-09 ENCOUNTER — Encounter (HOSPITAL_COMMUNITY): Payer: Self-pay | Admitting: Pharmacist

## 2012-05-09 ENCOUNTER — Other Ambulatory Visit (HOSPITAL_COMMUNITY): Payer: Self-pay | Admitting: Maternal and Fetal Medicine

## 2012-05-09 DIAGNOSIS — IMO0002 Reserved for concepts with insufficient information to code with codable children: Secondary | ICD-10-CM

## 2012-05-10 ENCOUNTER — Ambulatory Visit (HOSPITAL_COMMUNITY)
Admission: RE | Admit: 2012-05-10 | Discharge: 2012-05-10 | Disposition: A | Discharge status: Home / Self Care | Payer: Medicaid Other | Source: Ambulatory Visit | Attending: Obstetrics | Admitting: Obstetrics

## 2012-05-10 ENCOUNTER — Other Ambulatory Visit (HOSPITAL_COMMUNITY): Payer: Self-pay | Admitting: Maternal and Fetal Medicine

## 2012-05-10 DIAGNOSIS — IMO0002 Reserved for concepts with insufficient information to code with codable children: Secondary | ICD-10-CM

## 2012-05-10 DIAGNOSIS — O358XX0 Maternal care for other (suspected) fetal abnormality and damage, not applicable or unspecified: Secondary | ICD-10-CM | POA: Insufficient documentation

## 2012-05-10 DIAGNOSIS — O365990 Maternal care for other known or suspected poor fetal growth, unspecified trimester, not applicable or unspecified: Secondary | ICD-10-CM | POA: Insufficient documentation

## 2012-05-10 DIAGNOSIS — O4400 Placenta previa specified as without hemorrhage, unspecified trimester: Secondary | ICD-10-CM | POA: Insufficient documentation

## 2012-05-10 DIAGNOSIS — O99330 Smoking (tobacco) complicating pregnancy, unspecified trimester: Secondary | ICD-10-CM | POA: Insufficient documentation

## 2012-05-10 IMAGING — US US FETAL BPP W/O NONSTRESS
1 series · 13 of 15 positions shown · non-contrast
Comparison: none

[Series 1: us fetal bpp w/o nonstress · 0.23mm/px · 13 of 15 slices shown]
[im 1/15]
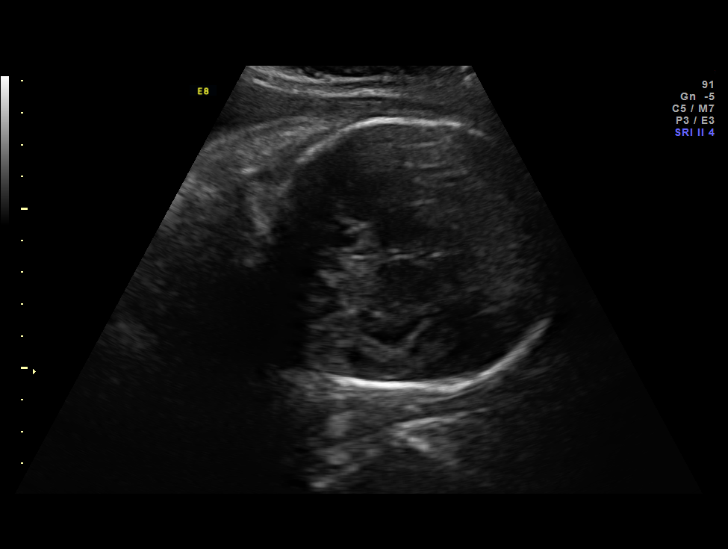
[im 2/15]
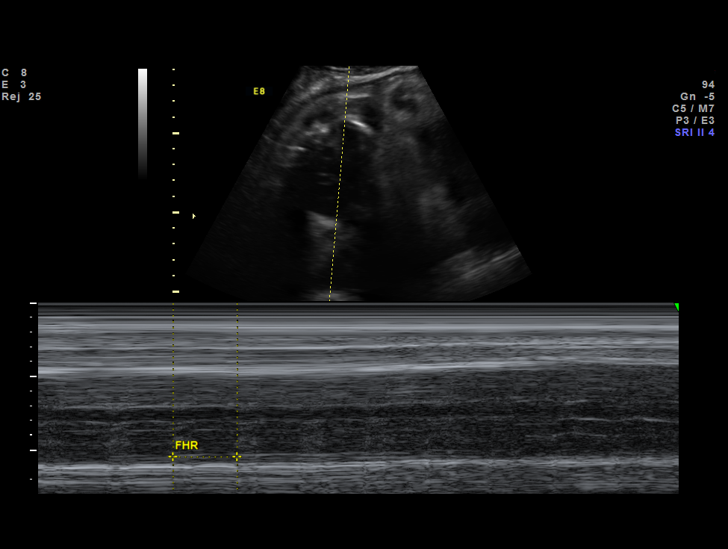
[im 3/15]
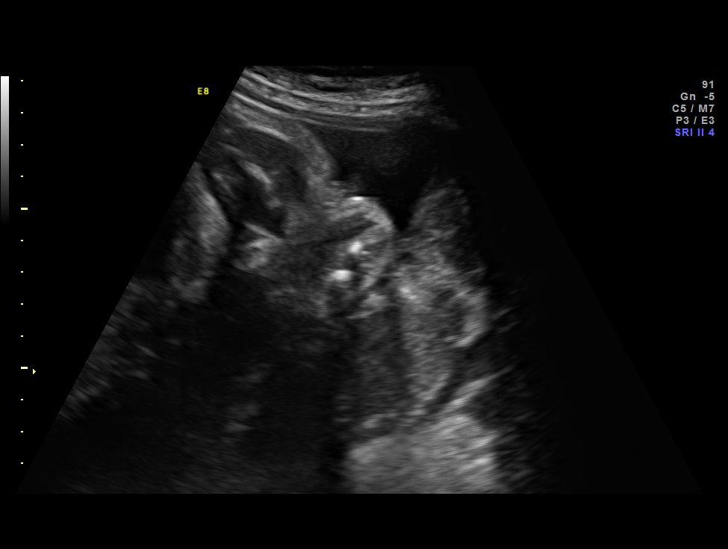
[im 5/15]
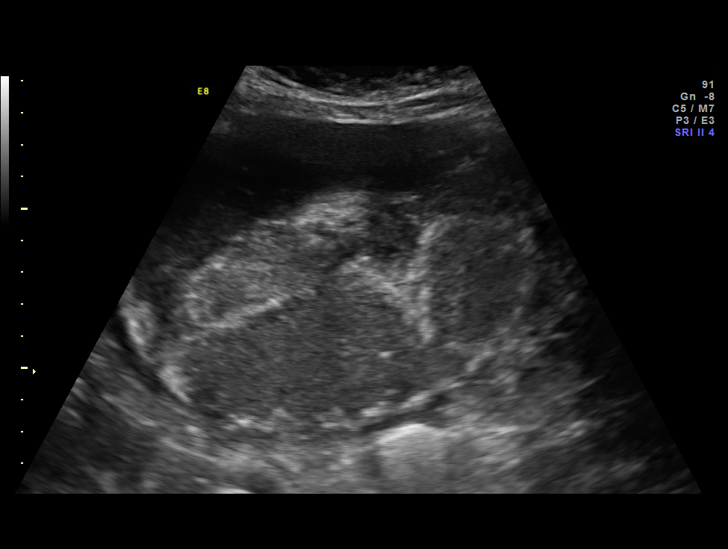
[im 6/15]
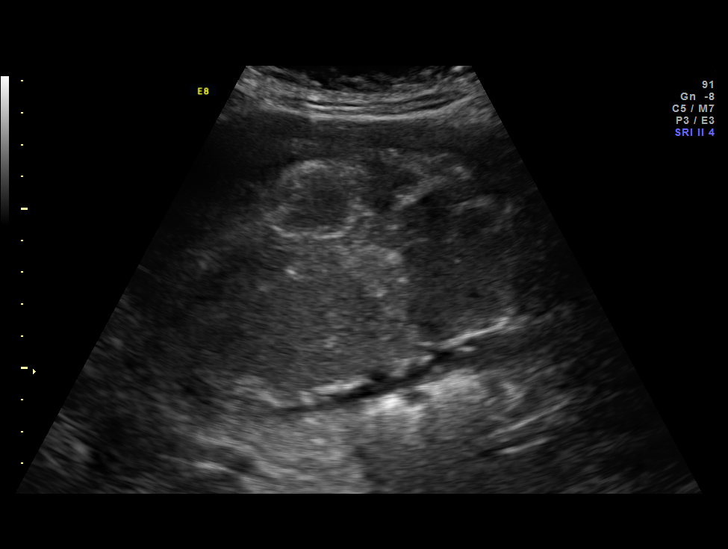
[im 7/15]
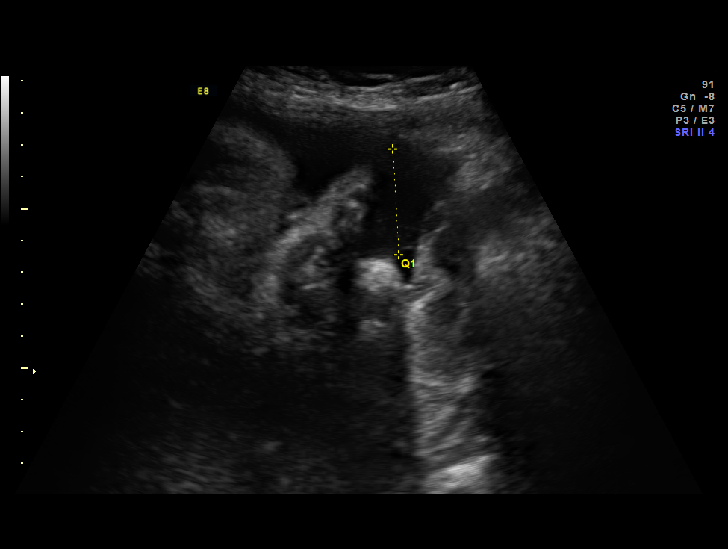
[im 8/15]
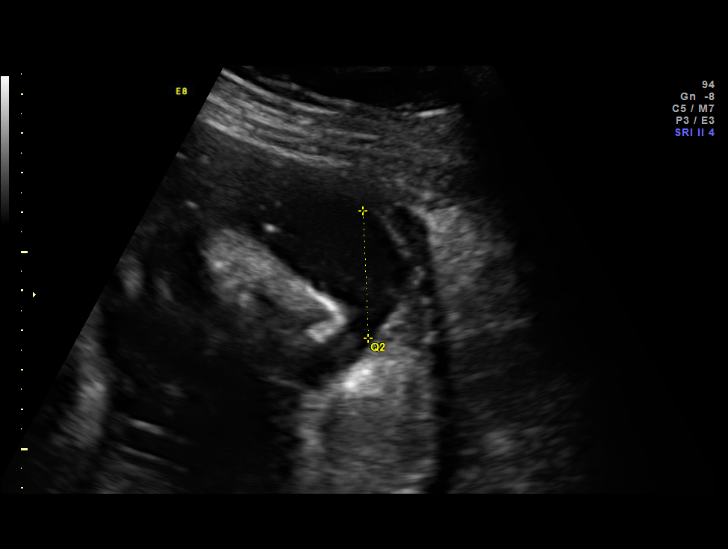
[im 9/15]
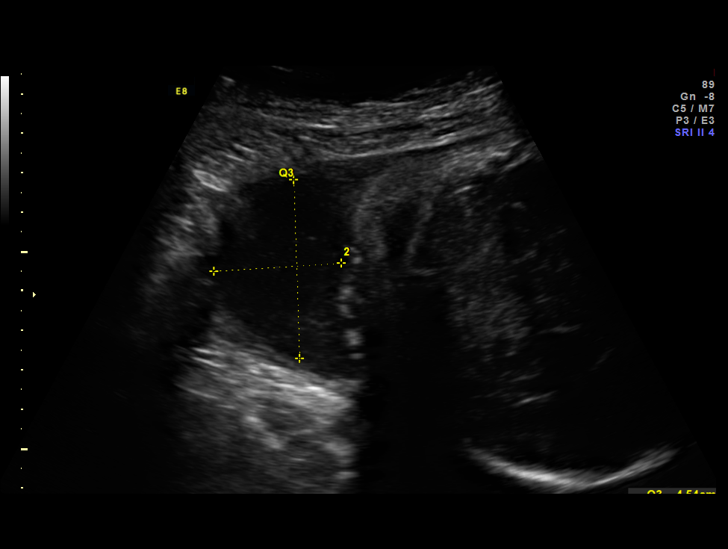
[im 10/15]
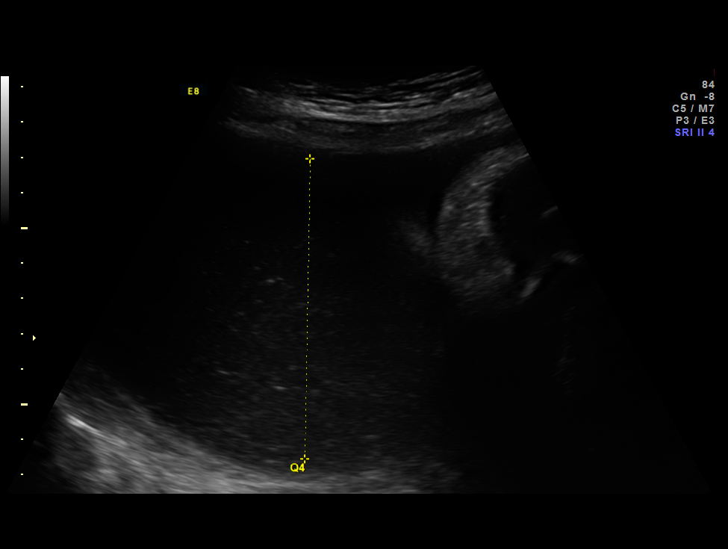
[im 11/15]
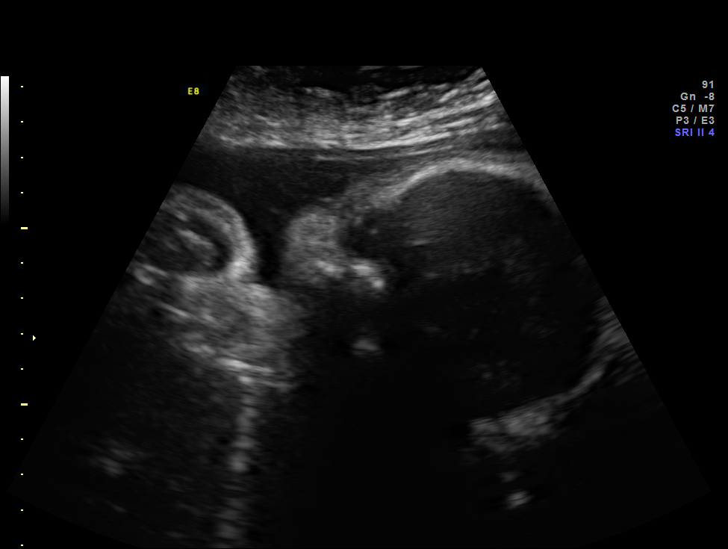
[im 13/15]
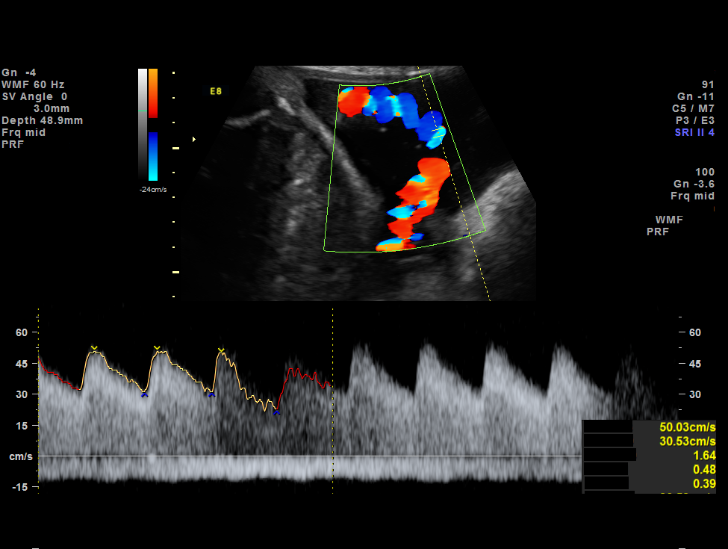
[im 14/15]
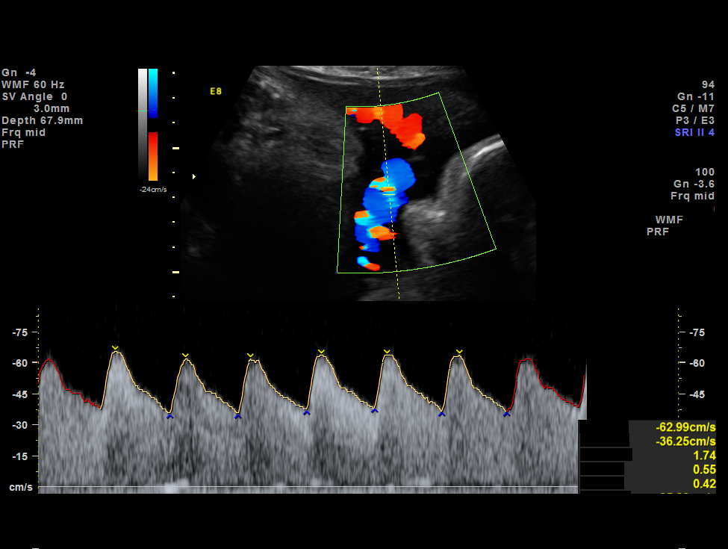
[im 15/15]
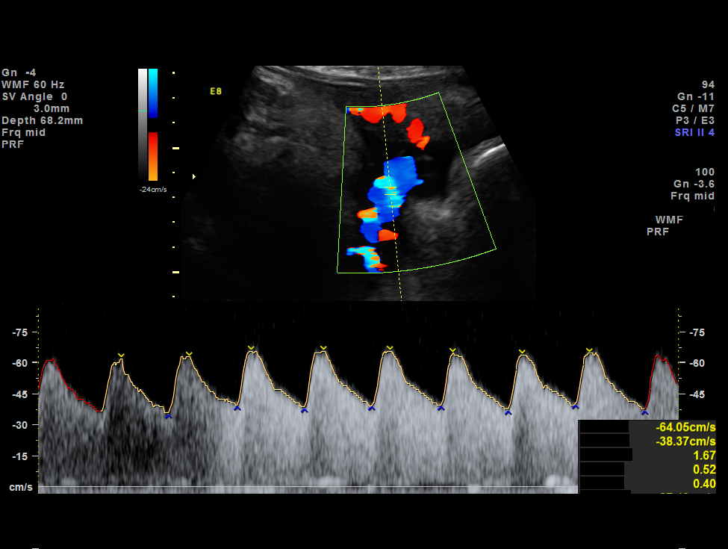

[13 of 15 positions shown; findings below may reference images not displayed]

OBSTETRICS REPORT
                      (Signed Final [DATE] [DATE])

Service(s) Provided

 US UA DOPPLER RE-EVAL                                 76828.1
Indications

 Size less than dates (Small for gestational [AGE]
 FGR)
 Cleft lip, unilateral
 Placenta previa/Low lying: No bleeding
 Cigarette smoker
 Assess fetal well being
 Club feet
Fetal Evaluation

 Num Of Fetuses:    1
 Fetal Heart Rate:  157                          bpm
 Cardiac Activity:  Observed
 Presentation:      Cephalic
 Placenta:          Posterior, low-lying,
                    cm from int os
 P. Cord            Previously Visualized
 Insertion:

 Amniotic Fluid
 AFI FV:      Subjectively within normal limits
 AFI Sum:     19.63   cm       78  %Tile     Larg Pckt:    8.52  cm
 RUQ:   3.33    cm   RLQ:    8.52   cm    LUQ:   3.24    cm   LLQ:    4.54   cm
Biophysical Evaluation

 Amniotic F.V:   Within normal limits       F. Tone:        Observed
 F. Movement:    Observed                   Score:          [DATE]
 F. Breathing:   Observed
Gestational Age

 LMP:           40w 2d        Date:  [DATE]                 EDD:   [DATE]
 Best:          38w 1d     Det. By:  U/S    ([DATE])        EDD:   [DATE]
Doppler - Fetal Vessels
 Umbilical Artery
 S/D:   1.68           10  %tile       RI:
 PI:    0.52                           PSV:       64.05   cm/s

Cervix Uterus Adnexa

 Cervix:       Not visualized (advanced GA >[SL])
Impression

 Single IUP at 38 [DATE] weeks
 Unilateral cleft lip+ / - palate
 Bilateral clubbed feet.
 Fetal growth restriction noted on previous ultrasound with the
 estimated fetal weight at the 10th %tile.
 Active singleton fetus with BPP of [DATE]
 Umbilical artery Doppler studies within normal limits for
 gestational age
 Normal amniotic fluid volume
Recommendations

 Continue 2x weekly antepartum fetal testing
 Scheduled C-section next week due to low lying placenta (< 2
 cm from the os)

 questions or concerns.

## 2012-05-10 NOTE — Progress Notes (Signed)
Sharon [name]  was seen today for an ultrasound appointment.  See full report in [facility].  Impression: Single IUP at 38 1/7 weeks Unilateral cleft lip+/- palate Bilateral clubbed feet. Fetal growth restriction noted on previous ultrasound with the estimated fetal weight at the 10th %tile.   Active singleton fetus with BPP of 8/8 Umbilical artery Doppler studies within normal limits for gestational age Normal amniotic fluid volume  Recommendations: Continue 2x weekly antepartum fetal testing Scheduled C-section next week due to low lying placenta (< 2 cm from the os)  Alpha Gula, MD

## 2012-05-14 ENCOUNTER — Encounter (HOSPITAL_COMMUNITY): Payer: Self-pay

## 2012-05-14 ENCOUNTER — Encounter (HOSPITAL_COMMUNITY)
Admission: RE | Admit: 2012-05-14 | Discharge: 2012-05-14 | Disposition: A | Discharge status: Home / Self Care | Payer: Medicaid Other | Source: Ambulatory Visit | Attending: Obstetrics | Admitting: Obstetrics

## 2012-05-14 HISTORY — DX: Anxiety disorder, unspecified: F41.9

## 2012-05-14 HISTORY — DX: Unspecified osteoarthritis, unspecified site: M19.90

## 2012-05-14 LAB — CBC
HCT: 36 % (ref 36.0–46.0)
Hemoglobin: 11.8 g/dL — ABNORMAL LOW (ref 12.0–15.0)
MCH: 31.1 pg (ref 26.0–34.0)
MCHC: 32.8 g/dL (ref 30.0–36.0)

## 2012-05-14 LAB — ABO/RH: ABO/RH(D): A POS

## 2012-05-14 LAB — SURGICAL PCR SCREEN: Staphylococcus aureus: INVALID — AB

## 2012-05-14 NOTE — Patient Instructions (Signed)
   Your procedure is scheduled on: Thursday, Nov 21 at 1145am  Enter through the [employer] of [facility] at: 1015am Pick up the phone at the desk and dial [phone] and inform us of your arrival.  Please call this number if you have any problems the morning of surgery: [phone]  Remember: Do not eat food after midnight: Wednesday Do not drink clear liquids after: midnight Wednesday Take these medicines the morning of surgery with a SIP OF WATER: none  Do not wear jewelry, make-up, or FINGER nail polish No metal in your hair or on your body. Do not wear lotions, powders, perfumes. You may wear deodorant.  Please use your CHG wash as directed prior to surgery.  Do not shave anywhere for at least 12 hours prior to first CHG shower.  Do not bring valuables to the hospital. Contacts, dentures or bridgework may not be worn into surgery.  Leave suitcase in the car. After Surgery it may be brought to your room. For patients being admitted to the hospital, checkout time is 11:00am the day of discharge. Home with boyfriend/fob Jenna Luo or mother Allie Bent

## 2012-05-16 ENCOUNTER — Encounter (HOSPITAL_COMMUNITY): Payer: Self-pay | Admitting: Anesthesiology

## 2012-05-16 ENCOUNTER — Encounter (HOSPITAL_COMMUNITY): Payer: Self-pay | Admitting: Obstetrics

## 2012-05-16 ENCOUNTER — Inpatient Hospital Stay (HOSPITAL_COMMUNITY)
Admission: AD | Admit: 2012-05-16 | Discharge: 2012-05-19 | DRG: 766 | Disposition: A | Discharge status: Home / Self Care | Payer: Medicaid Other | Source: Ambulatory Visit | Attending: Obstetrics | Admitting: Obstetrics

## 2012-05-16 ENCOUNTER — Inpatient Hospital Stay (HOSPITAL_COMMUNITY): Payer: Medicaid Other | Admitting: Anesthesiology

## 2012-05-16 ENCOUNTER — Encounter (HOSPITAL_COMMUNITY)
Admission: AD | Disposition: A | Discharge status: Home / Self Care | Payer: Self-pay | Source: Ambulatory Visit | Attending: Obstetrics

## 2012-05-16 DIAGNOSIS — O4400 Placenta previa specified as without hemorrhage, unspecified trimester: Secondary | ICD-10-CM

## 2012-05-16 DIAGNOSIS — Z98891 History of uterine scar from previous surgery: Secondary | ICD-10-CM

## 2012-05-16 DIAGNOSIS — O34219 Maternal care for unspecified type scar from previous cesarean delivery: Secondary | ICD-10-CM | POA: Diagnosis not present

## 2012-05-16 DIAGNOSIS — O4410 Placenta previa with hemorrhage, unspecified trimester: Principal | ICD-10-CM | POA: Diagnosis present

## 2012-05-16 SURGERY — Surgical Case
Anesthesia: Spinal | Site: Abdomen | Wound class: Clean Contaminated

## 2012-05-16 MED ORDER — KETOROLAC TROMETHAMINE 60 MG/2ML IM SOLN
INTRAMUSCULAR | Status: AC
  Administered 2012-05-16: 60 mg via INTRAMUSCULAR
  Filled 2012-05-16: qty 2

## 2012-05-16 MED ORDER — DIPHENHYDRAMINE HCL 50 MG/ML IJ SOLN: 12.5000 mg | INTRAMUSCULAR | Status: DC | PRN

## 2012-05-16 MED ORDER — SIMETHICONE 80 MG PO CHEW: 80.0000 mg | CHEWABLE_TABLET | ORAL | Status: DC | PRN

## 2012-05-16 MED ORDER — PROMETHAZINE HCL 25 MG/ML IJ SOLN: 6.2500 mg | INTRAMUSCULAR | Status: DC | PRN

## 2012-05-16 MED ORDER — DIPHENHYDRAMINE HCL 25 MG PO CAPS
25.0000 mg | ORAL_CAPSULE | ORAL | Status: DC | PRN
  Filled 2012-05-16: qty 1

## 2012-05-16 MED ORDER — MEDROXYPROGESTERONE ACETATE 150 MG/ML IM SUSP: 150.0000 mg | INTRAMUSCULAR | Status: DC | PRN

## 2012-05-16 MED ORDER — MIDAZOLAM HCL 2 MG/2ML IJ SOLN
INTRAMUSCULAR | Status: AC
  Filled 2012-05-16: qty 2

## 2012-05-16 MED ORDER — OXYTOCIN 10 UNIT/ML IJ SOLN
40.0000 [IU] | INTRAVENOUS | Status: DC | PRN
  Administered 2012-05-16: 40 [IU] via INTRAVENOUS

## 2012-05-16 MED ORDER — SODIUM CHLORIDE 0.9 % IJ SOLN: 3.0000 mL | INTRAMUSCULAR | Status: DC | PRN

## 2012-05-16 MED ORDER — MIDAZOLAM HCL 5 MG/5ML IJ SOLN
INTRAMUSCULAR | Status: DC | PRN
  Administered 2012-05-16: 1 mg via INTRAVENOUS

## 2012-05-16 MED ORDER — DIPHENHYDRAMINE HCL 25 MG PO CAPS: 25.0000 mg | ORAL_CAPSULE | Freq: Four times a day (QID) | ORAL | Status: DC | PRN

## 2012-05-16 MED ORDER — PHENYLEPHRINE 40 MCG/ML (10ML) SYRINGE FOR IV PUSH (FOR BLOOD PRESSURE SUPPORT)
PREFILLED_SYRINGE | INTRAVENOUS | Status: AC
  Filled 2012-05-16: qty 5

## 2012-05-16 MED ORDER — NALBUPHINE HCL 10 MG/ML IJ SOLN
5.0000 mg | INTRAMUSCULAR | Status: DC | PRN
  Filled 2012-05-16: qty 1

## 2012-05-16 MED ORDER — CEFAZOLIN SODIUM-DEXTROSE 2-3 GM-% IV SOLR
INTRAVENOUS | Status: AC
  Administered 2012-05-16: 2 g via INTRAVENOUS
  Filled 2012-05-16: qty 50

## 2012-05-16 MED ORDER — FENTANYL CITRATE 0.05 MG/ML IJ SOLN
INTRAMUSCULAR | Status: AC
  Filled 2012-05-16: qty 2

## 2012-05-16 MED ORDER — KETOROLAC TROMETHAMINE 30 MG/ML IJ SOLN: 30.0000 mg | Freq: Four times a day (QID) | INTRAMUSCULAR | Status: AC | PRN

## 2012-05-16 MED ORDER — MORPHINE SULFATE 0.5 MG/ML IJ SOLN
INTRAMUSCULAR | Status: AC
  Filled 2012-05-16: qty 10

## 2012-05-16 MED ORDER — MENTHOL 3 MG MT LOZG: 1.0000 | LOZENGE | OROMUCOSAL | Status: DC | PRN

## 2012-05-16 MED ORDER — NICOTINE 21 MG/24HR TD PT24
21.0000 mg | MEDICATED_PATCH | Freq: Every day | TRANSDERMAL | Status: DC
  Administered 2012-05-16: 21 mg via TRANSDERMAL
  Filled 2012-05-16 (×5): qty 1

## 2012-05-16 MED ORDER — LACTATED RINGERS IV SOLN
INTRAVENOUS | Status: DC
  Administered 2012-05-16: 23:00:00 via INTRAVENOUS

## 2012-05-16 MED ORDER — ONDANSETRON HCL 4 MG/2ML IJ SOLN: 4.0000 mg | Freq: Three times a day (TID) | INTRAMUSCULAR | Status: DC | PRN

## 2012-05-16 MED ORDER — MEPERIDINE HCL 25 MG/ML IJ SOLN: 6.2500 mg | INTRAMUSCULAR | Status: DC | PRN

## 2012-05-16 MED ORDER — ONDANSETRON HCL 4 MG PO TABS: 4.0000 mg | ORAL_TABLET | ORAL | Status: DC | PRN

## 2012-05-16 MED ORDER — ONDANSETRON HCL 4 MG/2ML IJ SOLN
INTRAMUSCULAR | Status: AC
  Filled 2012-05-16: qty 2

## 2012-05-16 MED ORDER — ONDANSETRON HCL 4 MG/2ML IJ SOLN
INTRAMUSCULAR | Status: DC | PRN
  Administered 2012-05-16: 4 mg via INTRAVENOUS

## 2012-05-16 MED ORDER — SCOPOLAMINE 1 MG/3DAYS TD PT72
MEDICATED_PATCH | TRANSDERMAL | Status: AC
  Administered 2012-05-16: 1.5 mg via TRANSDERMAL
  Filled 2012-05-16: qty 1

## 2012-05-16 MED ORDER — OXYTOCIN 40 UNITS IN LACTATED RINGERS INFUSION - SIMPLE MED: 62.5000 mL/h | INTRAVENOUS | Status: AC

## 2012-05-16 MED ORDER — FENTANYL CITRATE 0.05 MG/ML IJ SOLN
INTRAMUSCULAR | Status: DC | PRN
  Administered 2012-05-16: 25 ug via INTRATHECAL

## 2012-05-16 MED ORDER — DIPHENHYDRAMINE HCL 50 MG/ML IJ SOLN: 25.0000 mg | INTRAMUSCULAR | Status: DC | PRN

## 2012-05-16 MED ORDER — TETANUS-DIPHTH-ACELL PERTUSSIS 5-2.5-18.5 LF-MCG/0.5 IM SUSP
0.5000 mL | Freq: Once | INTRAMUSCULAR | Status: AC
  Administered 2012-05-17: 0.5 mL via INTRAMUSCULAR
  Filled 2012-05-16: qty 0.5

## 2012-05-16 MED ORDER — OXYTOCIN 10 UNIT/ML IJ SOLN
INTRAMUSCULAR | Status: AC
  Filled 2012-05-16: qty 4

## 2012-05-16 MED ORDER — PRENATAL MULTIVITAMIN CH
1.0000 | ORAL_TABLET | Freq: Every day | ORAL | Status: DC
  Administered 2012-05-17 – 2012-05-19 (×3): 1 via ORAL
  Filled 2012-05-16 (×3): qty 1

## 2012-05-16 MED ORDER — MORPHINE SULFATE (PF) 0.5 MG/ML IJ SOLN
INTRAMUSCULAR | Status: DC | PRN
  Administered 2012-05-16: .2 mg via INTRATHECAL

## 2012-05-16 MED ORDER — SCOPOLAMINE 1 MG/3DAYS TD PT72
1.0000 | MEDICATED_PATCH | Freq: Once | TRANSDERMAL | Status: DC
  Administered 2012-05-16: 1.5 mg via TRANSDERMAL

## 2012-05-16 MED ORDER — OXYCODONE-ACETAMINOPHEN 5-325 MG PO TABS
1.0000 | ORAL_TABLET | ORAL | Status: DC | PRN
  Administered 2012-05-17 – 2012-05-18 (×4): 1 via ORAL
  Administered 2012-05-18: 2 via ORAL
  Administered 2012-05-18 – 2012-05-19 (×3): 1 via ORAL
  Filled 2012-05-16 (×3): qty 1
  Filled 2012-05-16: qty 2
  Filled 2012-05-16 (×5): qty 1

## 2012-05-16 MED ORDER — DIBUCAINE 1 % RE OINT: 1.0000 "application " | TOPICAL_OINTMENT | RECTAL | Status: DC | PRN

## 2012-05-16 MED ORDER — WITCH HAZEL-GLYCERIN EX PADS: 1.0000 "application " | MEDICATED_PAD | CUTANEOUS | Status: DC | PRN

## 2012-05-16 MED ORDER — KETOROLAC TROMETHAMINE 30 MG/ML IJ SOLN: 15.0000 mg | Freq: Once | INTRAMUSCULAR | Status: DC | PRN

## 2012-05-16 MED ORDER — FENTANYL CITRATE 0.05 MG/ML IJ SOLN
INTRAMUSCULAR | Status: DC | PRN
  Administered 2012-05-16: 50 ug via INTRAVENOUS

## 2012-05-16 MED ORDER — DEXTROSE 5 % IV SOLN: 1.0000 ug/kg/h | INTRAVENOUS | Status: DC | PRN

## 2012-05-16 MED ORDER — NALOXONE HCL 0.4 MG/ML IJ SOLN: 0.4000 mg | INTRAMUSCULAR | Status: DC | PRN

## 2012-05-16 MED ORDER — ONDANSETRON HCL 4 MG/2ML IJ SOLN: 4.0000 mg | INTRAMUSCULAR | Status: DC | PRN

## 2012-05-16 MED ORDER — KETOROLAC TROMETHAMINE 60 MG/2ML IM SOLN
60.0000 mg | Freq: Once | INTRAMUSCULAR | Status: AC | PRN
  Administered 2012-05-16: 60 mg via INTRAMUSCULAR

## 2012-05-16 MED ORDER — IBUPROFEN 600 MG PO TABS
600.0000 mg | ORAL_TABLET | Freq: Four times a day (QID) | ORAL | Status: DC
  Administered 2012-05-16 – 2012-05-19 (×11): 600 mg via ORAL
  Filled 2012-05-16 (×10): qty 1

## 2012-05-16 MED ORDER — SIMETHICONE 80 MG PO CHEW
80.0000 mg | CHEWABLE_TABLET | Freq: Three times a day (TID) | ORAL | Status: DC
  Administered 2012-05-16 – 2012-05-19 (×9): 80 mg via ORAL

## 2012-05-16 MED ORDER — ZOLPIDEM TARTRATE 5 MG PO TABS: 5.0000 mg | ORAL_TABLET | Freq: Every evening | ORAL | Status: DC | PRN

## 2012-05-16 MED ORDER — PHENYLEPHRINE HCL 10 MG/ML IJ SOLN
INTRAMUSCULAR | Status: DC | PRN
  Administered 2012-05-16 (×2): 40 ug via INTRAVENOUS
  Administered 2012-05-16: 80 ug via INTRAVENOUS
  Administered 2012-05-16: 120 ug via INTRAVENOUS

## 2012-05-16 MED ORDER — LANOLIN HYDROUS EX OINT: 1.0000 "application " | TOPICAL_OINTMENT | CUTANEOUS | Status: DC | PRN

## 2012-05-16 MED ORDER — LACTATED RINGERS IV SOLN
Freq: Once | INTRAVENOUS | Status: AC
  Administered 2012-05-16 (×3): via INTRAVENOUS

## 2012-05-16 MED ORDER — METOCLOPRAMIDE HCL 5 MG/ML IJ SOLN: 10.0000 mg | Freq: Three times a day (TID) | INTRAMUSCULAR | Status: DC | PRN

## 2012-05-16 MED ORDER — 0.9 % SODIUM CHLORIDE (POUR BTL) OPTIME
TOPICAL | Status: DC | PRN
  Administered 2012-05-16: 1000 mL

## 2012-05-16 MED ORDER — SENNOSIDES-DOCUSATE SODIUM 8.6-50 MG PO TABS
2.0000 | ORAL_TABLET | Freq: Every day | ORAL | Status: DC
  Administered 2012-05-16 – 2012-05-18 (×3): 2 via ORAL

## 2012-05-16 MED ORDER — HYDROMORPHONE HCL PF 1 MG/ML IJ SOLN: 0.2500 mg | INTRAMUSCULAR | Status: DC | PRN

## 2012-05-16 MED ORDER — BUPIVACAINE IN DEXTROSE 0.75-8.25 % IT SOLN
INTRATHECAL | Status: DC | PRN
  Administered 2012-05-16: 1.6 mg via INTRATHECAL

## 2012-05-16 MED ORDER — LACTATED RINGERS IV SOLN
Freq: Once | INTRAVENOUS | Status: AC
  Administered 2012-05-16: 11:00:00 via INTRAVENOUS

## 2012-05-16 SURGICAL SUPPLY — 49 items
ADH SKN CLS APL DERMABOND .7 (GAUZE/BANDAGES/DRESSINGS) ×1
CANISTER WOUND CARE 500ML ATS (WOUND CARE) IMPLANT
CLOTH BEACON ORANGE TIMEOUT ST (SAFETY) ×2 IMPLANT
CONTAINER PREFILL 10% NBF 15ML (MISCELLANEOUS) ×4 IMPLANT
DERMABOND ADVANCED (GAUZE/BANDAGES/DRESSINGS) ×1
DERMABOND ADVANCED .7 DNX12 (GAUZE/BANDAGES/DRESSINGS) ×1 IMPLANT
DRSG COVADERM 4X10 (GAUZE/BANDAGES/DRESSINGS) ×2 IMPLANT
DRSG VAC ATS LRG SENSATRAC (GAUZE/BANDAGES/DRESSINGS) IMPLANT
DRSG VAC ATS MED SENSATRAC (GAUZE/BANDAGES/DRESSINGS) IMPLANT
DRSG VAC ATS SM SENSATRAC (GAUZE/BANDAGES/DRESSINGS) IMPLANT
DURAPREP 26ML APPLICATOR (WOUND CARE) ×2 IMPLANT
ELECT REM PT RETURN 9FT ADLT (ELECTROSURGICAL) ×2
ELECTRODE REM PT RTRN 9FT ADLT (ELECTROSURGICAL) ×1 IMPLANT
EXTRACTOR VACUUM M CUP 4 TUBE (SUCTIONS) IMPLANT
GLOVE BIO SURGEON STRL SZ8 (GLOVE) ×4 IMPLANT
GLOVE BIOGEL PI IND STRL 6.5 (GLOVE) IMPLANT
GLOVE BIOGEL PI IND STRL 7.0 (GLOVE) IMPLANT
GLOVE BIOGEL PI INDICATOR 6.5 (GLOVE) ×1
GLOVE BIOGEL PI INDICATOR 7.0 (GLOVE) ×1
GLOVE ECLIPSE 6.0 STRL STRAW (GLOVE) ×1 IMPLANT
GLOVE ECLIPSE 7.0 STRL STRAW (GLOVE) ×1 IMPLANT
GLOVE SURG SS PI 7.0 STRL IVOR (GLOVE) ×1 IMPLANT
GOWN PREVENTION PLUS LG XLONG (DISPOSABLE) ×4 IMPLANT
GOWN PREVENTION PLUS XLARGE (GOWN DISPOSABLE) ×2 IMPLANT
KIT ABG SYR 3ML LUER SLIP (SYRINGE) IMPLANT
NDL HYPO 25X5/8 SAFETYGLIDE (NEEDLE) ×1 IMPLANT
NEEDLE HYPO 25X5/8 SAFETYGLIDE (NEEDLE) ×2 IMPLANT
NS IRRIG 1000ML POUR BTL (IV SOLUTION) ×2 IMPLANT
PACK C SECTION WH (CUSTOM PROCEDURE TRAY) ×2 IMPLANT
PAD OB MATERNITY 4.3X12.25 (PERSONAL CARE ITEMS) IMPLANT
RTRCTR C-SECT PINK 25CM LRG (MISCELLANEOUS) ×2 IMPLANT
SLEEVE SCD COMPRESS KNEE MED (MISCELLANEOUS) IMPLANT
STAPLER VISISTAT 35W (STAPLE) IMPLANT
SUT GUT PLAIN 0 CT-3 TAN 27 (SUTURE) IMPLANT
SUT MNCRL 0 VIOLET CTX 36 (SUTURE) ×3 IMPLANT
SUT MNCRL AB 4-0 PS2 18 (SUTURE) IMPLANT
SUT MON AB 2-0 CT1 27 (SUTURE) ×2 IMPLANT
SUT MON AB 3-0 SH 27 (SUTURE)
SUT MON AB 3-0 SH27 (SUTURE) IMPLANT
SUT MONOCRYL 0 CTX 36 (SUTURE) ×3
SUT PDS AB 0 CTX 60 (SUTURE) IMPLANT
SUT PLAIN 2 0 XLH (SUTURE) IMPLANT
SUT VIC AB 0 CTX 36 (SUTURE)
SUT VIC AB 0 CTX36XBRD ANBCTRL (SUTURE) IMPLANT
SUT VIC AB 2-0 CT1 27 (SUTURE)
SUT VIC AB 2-0 CT1 TAPERPNT 27 (SUTURE) IMPLANT
TOWEL OR 17X24 6PK STRL BLUE (TOWEL DISPOSABLE) ×4 IMPLANT
TRAY FOLEY CATH 14FR (SET/KITS/TRAYS/PACK) ×2 IMPLANT
WATER STERILE IRR 1000ML POUR (IV SOLUTION) ×2 IMPLANT

## 2012-05-16 NOTE — Anesthesia Postprocedure Evaluation (Signed)
  Anesthesia Post-op Note  Patient: Sharon [name]  Procedure(s) Performed: Procedure(s) (LRB) with comments: CESAREAN SECTION (N/A) - Primary  Patient Location: Mother/Baby  Anesthesia Type:Spinal  Level of Consciousness: awake, alert  and oriented  Airway and Oxygen Therapy: Patient Spontanous Breathing  Post-op Pain: none  Post-op Assessment: Post-op Vital signs reviewed and Patient's Cardiovascular Status Stable  Post-op Vital Signs: Reviewed and stable  Complications: No apparent anesthesia complications

## 2012-05-16 NOTE — Op Note (Signed)
Cesarean Section Procedure Note   Sharon [name]   05/16/2012  Indications: Placenta Previa   Pre-operative Diagnosis: Placenta Previa.   Post-operative Diagnosis: Same   Surgeon: Mckenze Slone A  Assistants: Surgical technician  Anesthesia: spinal  Procedure Details:  The patient was seen in the Holding Room. The risks, benefits, complications, treatment options, and expected outcomes were discussed with the patient. The patient concurred with the proposed plan, giving informed consent. The patient was identified as Sharon [name] and the procedure verified as C-Section Delivery. A Time Out was held and the above information confirmed.  After induction of anesthesia, the patient was draped and prepped in the usual sterile manner. A transverse incision was made and carried down through the subcutaneous tissue to the fascia. The fascial incision was made and extended transversely. The fascia was separated from the underlying rectus tissue superiorly and inferiorly. The peritoneum was identified and entered. The peritoneal incision was extended longitudinally. The utero-vesical peritoneal reflection was incised transversely and the bladder flap was bluntly freed from the lower uterine segment. A low transverse uterine incision was made. Delivered from cephalic presentation was a 2590 gram living newborn female infant(s). APGAR (1 MIN): 9   APGAR (5 MINS): 9   APGAR (10 MINS):    A cord ph was not sent. The umbilical cord was clamped and cut cord. A sample was obtained for evaluation. The placenta was removed Intact and appeared normal.  The uterine incision was closed with running locked sutures of 1-0 Monocryl. A second imbricating layer of the same suture was placed.  Hemostasis was observed. The paracolic gutters were irrigated. The parieto peritoneum was closed in a running fashion with 2-0 Vicryl.  The fascia was then reapproximated with running sutures of 0 Vicryl.  The skin was closed  with staples.  Instrument, sponge, and needle counts were correct prior the abdominal closure and were correct at the conclusion of the case.    Findings:   Estimated Blood Loss:  Total IV Fluids:   Urine Output: 100CC OF clear urine  Specimens: Placenta  Complications: no complications  Disposition: PACU - hemodynamically stable.  Maternal Condition: stable   Baby condition / location:  nursery-stable    Signed: Surgeon(s): Brock Bad, MD

## 2012-05-16 NOTE — Anesthesia Procedure Notes (Signed)
Spinal  Patient location during procedure: OR Start time: 05/16/2012 12:20 PM End time: 05/16/2012 12:23 PM Staffing Anesthesiologist: Sandrea Hughs Performed by: anesthesiologist  Preanesthetic Checklist Completed: patient identified, site marked, surgical consent, pre-op evaluation, timeout performed, IV checked, risks and benefits discussed and monitors and equipment checked Spinal Block Patient position: sitting Prep: DuraPrep Patient monitoring: heart rate, cardiac monitor, continuous pulse ox and blood pressure Approach: midline Location: L3-4 Injection technique: single-shot Needle Needle type: Sprotte  Needle gauge: 24 G Needle length: 9 cm Needle insertion depth: 6 cm Assessment Sensory level: T4

## 2012-05-16 NOTE — Addendum Note (Signed)
Addendum  created 05/16/12 1824 by Shanon Payor, CRNA   Modules edited:Charges VN, Notes Section

## 2012-05-16 NOTE — H&P (Signed)
Sharon [name] is a 21 y.o. female presenting for C/S. Maternal Medical History:  Reason for admission: 21 yo G1  [facility] 05-23-12.  Presents for C/S for placenta previa.  Fetal activity: Perceived fetal activity is normal.   Last perceived fetal movement was within the past hour.    Prenatal complications: no prenatal complications Prenatal Complications - Diabetes: none.    OB History    Grav Para Term Preterm Abortions TAB SAB Ect Mult Living   1              Obstetric Comments   1 st pregnancy , unplanned     Past Medical History  Diagnosis Date  . Anxiety     no meds  . Headache     otc med prn  . Arthritis     right lower arm tendonitis   Past Surgical History  Procedure Date  . Foot surgery 1997    removal foreign body (sewing needle)  . Tooth extraction 2008    upper incisor x2   Family History: family history is negative for Anesthesia problems, and Hypotension, and Malignant hyperthermia, and Pseudochol deficiency, . Social History:  reports that she has been smoking Cigarettes.  She has a 2 pack-year smoking history. She has quit using smokeless tobacco. She reports that she uses illicit drugs (Marijuana and Other-see comments). She reports that she does not drink alcohol.   Prenatal Transfer Tool  Maternal Diabetes: No Genetic Screening: Normal Maternal Ultrasounds/Referrals: Normal Fetal Ultrasounds or other Referrals:  None Maternal Substance Abuse:  No Significant Maternal Medications:  None Significant Maternal Lab Results:  Lab values include: Other:  Other Comments:  None  Review of Systems  All other systems reviewed and are negative.      Blood pressure 127/80, pulse 92, temperature 98.2 F (36.8 C), temperature source Oral, resp. rate 20, last menstrual period 08/02/2011, SpO2 100.00%. Maternal Exam:  Abdomen: Patient reports no abdominal tenderness. Introitus: not evaluated.   Cervix: not evaluated.   Physical Exam  Nursing note and  vitals reviewed. Constitutional: She is oriented to person, place, and time. She appears well-developed and well-nourished.  HENT:  Head: Normocephalic and atraumatic.  Eyes: Conjunctivae normal are normal. Pupils are equal, round, and reactive to light.  Neck: Normal range of motion. Neck supple.  Cardiovascular: Normal rate and regular rhythm.   Respiratory: Effort normal and breath sounds normal.  GI: Soft.  Genitourinary: Vagina normal and uterus normal.  Musculoskeletal: Normal range of motion.  Neurological: She is alert and oriented to person, place, and time.  Skin: Skin is warm and dry.  Psychiatric: She has a normal mood and affect. Her behavior is normal. Judgment and thought content normal.    Prenatal labs: ABO, Rh: --/--/A POS (11/19 0850) Antibody: NEG (11/19 0844) Rubella: Immune (10/23 0000) RPR: NON REACTIVE (11/19 0844)  HBsAg: Negative (10/23 0000)  HIV: Non-reactive (10/23 0000)  GBS:     Assessment/Plan: 39 weeks.  Placenta previa.  For primary C/S.   HARPER,CHARLES A 05/16/2012, 11:40 AM

## 2012-05-16 NOTE — Anesthesia Preprocedure Evaluation (Addendum)
Anesthesia Evaluation  Patient identified by MRN, date of birth, ID band Patient awake    Reviewed: Allergy & Precautions, H&P , NPO status , Patient's Chart, lab work & pertinent test results  Airway Mallampati: II TM Distance: >3 FB Neck ROM: full    Dental No notable dental hx.    Pulmonary Current Smoker,    Pulmonary exam normal       Cardiovascular negative cardio ROS      Neuro/Psych    GI/Hepatic negative GI ROS, Neg liver ROS,   Endo/Other  negative endocrine ROS  Renal/GU negative Renal ROS  negative genitourinary   Musculoskeletal negative musculoskeletal ROS (+)   Abdominal (+) + obese,   Peds negative pediatric ROS (+)  Hematology negative hematology ROS (+)   Anesthesia Other Findings   Reproductive/Obstetrics (+) Pregnancy                          Anesthesia Physical Anesthesia Plan  ASA: II  Anesthesia Plan: Spinal   Post-op Pain Management:    Induction:   Airway Management Planned:   Additional Equipment:   Intra-op Plan:   Post-operative Plan:   Informed Consent: I have reviewed the patients History and Physical, chart, labs and discussed the procedure including the risks, benefits and alternatives for the proposed anesthesia with the patient or authorized representative who has indicated his/her understanding and acceptance.     Plan Discussed with: CRNA and Surgeon  Anesthesia Plan Comments:         Anesthesia Quick Evaluation

## 2012-05-16 NOTE — Transfer of Care (Signed)
Immediate Anesthesia Transfer of Care Note  Patient: Sharon [name]  Procedure(s) Performed: Procedure(s) (LRB) with comments: CESAREAN SECTION (N/A) - Primary  Patient Location: PACU  Anesthesia Type:Spinal  Level of Consciousness: awake  Airway & Oxygen Therapy: Patient Spontanous Breathing  Post-op Assessment: Report given to PACU RN and Post -op Vital signs reviewed and stable  Post vital signs: Reviewed and stable  Complications: No apparent anesthesia complications

## 2012-05-16 NOTE — Consult Note (Signed)
Neonatology Note:  Attendance at C-section:  I was asked to attend this primary C/S at 38 weeks due to placenta previa. 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 Mother's UDS was positive for opiates, marijuana, and benzodiazapenes on June. 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 ROM at delivery, fluid clear. Infant vigorous with good spontaneous cry and tone. Needed only minimal bulb suctioning. Ap 9/9. Lungs clear to ausc in DR. 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 Minimal hypospadias with little foreskin present. No cardiac murmurs, excellent perfusion in DR. I spoke with the parents about the anomalies. To CN to care of Pediatrician.  Serenity Fortner, MD  

## 2012-05-16 NOTE — Anesthesia Postprocedure Evaluation (Signed)
  Anesthesia Post-op Note  Patient: Sharon [name]  Procedure(s) Performed: Procedure(s) (LRB) with comments: CESAREAN SECTION (N/A) - Primary  Patient Location: PACU  Anesthesia Type:Spinal  Level of Consciousness: awake, alert  and oriented  Airway and Oxygen Therapy: Patient Spontanous Breathing  Post-op Pain: none  Post-op Assessment: Post-op Vital signs reviewed, Patient's Cardiovascular Status Stable, Respiratory Function Stable, Patent Airway, No signs of Nausea or vomiting, Pain level controlled, No headache, No backache, No residual numbness and No residual motor weakness  Post-op Vital Signs: Reviewed and stable  Complications: No apparent anesthesia complications

## 2012-05-17 ENCOUNTER — Ambulatory Visit (HOSPITAL_COMMUNITY): Payer: Medicaid Other

## 2012-05-17 ENCOUNTER — Encounter (HOSPITAL_COMMUNITY): Payer: Self-pay | Admitting: Obstetrics

## 2012-05-17 DIAGNOSIS — O34219 Maternal care for unspecified type scar from previous cesarean delivery: Secondary | ICD-10-CM | POA: Diagnosis not present

## 2012-05-17 LAB — MRSA CULTURE

## 2012-05-17 LAB — CBC
HCT: 33.2 % — ABNORMAL LOW (ref 36.0–46.0)
MCV: 94.9 fL (ref 78.0–100.0)
RDW: 13.9 % (ref 11.5–15.5)
WBC: 12.1 10*3/uL — ABNORMAL HIGH (ref 4.0–10.5)

## 2012-05-17 NOTE — Clinical Social Work Maternal (Signed)
     Clinical Social Work Department PSYCHOSOCIAL ASSESSMENT - MATERNAL/CHILD 05/17/2012  Patient:  Sharon [name], Sharon [name]  Account Number:  [id number]  Admit Date:  05/16/2012  Marjo Bicker Name:   Fraser Din    Clinical Social Worker:  Reece Levy, [place]   Date/Time:  05/17/2012 02:56 PM  Date Referred:  05/17/2012   Referral source  Physician     Referred reason  Substance Abuse   Other referral source:    I:  FAMILY / HOME ENVIRONMENT Marjo Bicker legal guardian:  PARENT  Guardian - Name Guardian - Age Guardian - Address  [place]  [address]   Other household support members/support persons Other support:   family and friends    II  PSYCHOSOCIAL DATA Information Source:  Other - See comment  Financial and Community Resources Employment:   MOB- works at [employer]- works at [employer] resources:  [employer] If [employer] - [employer]:  [employer] Other  [employer]   School / Grade:  12 Government social research officer / Statistician / Early Interventions:  Cultural issues impacting care:    III  STRENGTHS Strengths  Adequate Resources  Home prepared for Child (including basic supplies)  Supportive family/friends   Strength comment:    IV  RISK FACTORS AND CURRENT PROBLEMS Current Problem:  YES   Risk Factor & Current Problem Patient Issue Family Issue Risk Factor / Current Problem Comment  Substance Abuse Y Y drug screen  pending   N N     V  SOCIAL WORK ASSESSMENT MOB admits to smoking marijuana as recently as last week- she says she found it helped her appetite and had cut back some but uses regularly-  MOB and FOB informed of hospital protocol and duty to report to CPS if baby is positive- FOB appears somewhat concerned by this and asked additional questions- MOB shared with him that this had happened to one of her friends and they visited the home.      VI SOCIAL WORK PLAN Social Work Plan  Other   Type of  pt/family education:   Drug use, DSS/CPS   If child protective services report - county:   If child protective services report - date:   Information/referral to community resources comment:   Encouraged MOB to abstain from marijuana use and attempted to educate them on the negative side effects of using as well as it being illegal. Both parents acknowledge this and had no further comment or questions.   Other social work plan:   Await drug screen results

## 2012-05-17 NOTE — Progress Notes (Signed)
Patient ID: Sharon [name], female   DOB: Oct 28, 1990, 21 y.o.   MRN: [id number] Subjective: POD# 1 s/p Cesarean Delivery.  Indications: placenta previa  RH status/Rubella reviewed. Feeding: bottle Patient reports tolerating PO.  Denies HA/SOB/C/P/N/V/dizziness.   Breast symptoms: no.  She reports vaginal bleeding as normal, without clots.  She is ambulating, urinating without difficulty.     Objective: Vital signs in last 24 hours: BP 123/64  Pulse 70  Temp 98.7 F (37.1 C) (Oral)  Resp 18  Wt 88.905 kg (196 lb)  SpO2 100%  LMP 08/02/2011  Breastfeeding? Unknown       Physical Exam:  General: alert CV: Regular rate and rhythm Resp: clear Abdomen: soft, nontender, normal bowel sounds Lochia: minimal Uterine Fundus: firm, below umbilicus, nontender Incision: clean, dry and intact Ext: extremities normal, atraumatic, no cyanosis or edema    Basename 05/17/12 0610  HGB 10.9*  HCT 33.2*      Assessment/Plan: 21 y.o.  status post Cesarean section. POD# 1.   Doing well, stable.              Advance diet as tolerated Start po pain meds D/C foley  HLIV  Ambulate IS Routine post-op care  JACKSON-MOORE,Francine Hannan A 05/17/2012, 1:59 PM

## 2012-05-18 LAB — TYPE AND SCREEN
ABO/RH(D): A POS
Unit division: 0

## 2012-05-18 NOTE — Progress Notes (Signed)
Patient ID: Sharon [name], female   DOB: Oct 22, 1990, 21 y.o.   MRN: [id number] Postpartum day 2 from C-section Vital signs normal Incision clean and dry Legs negative Doing well

## 2012-05-19 NOTE — Discharge Summary (Signed)
Obstetric Discharge Summary Reason for Admission: onset of labor Prenatal Procedures: none Intrapartum Procedures: cesarean: low cervical, transverse Postpartum Procedures: none Complications-Operative and Postpartum: none Hemoglobin  Date Value Range Status  05/17/2012 10.9* 12.0 - 15.0 g/dL Final     HCT  Date Value Range Status  05/17/2012 33.2* 36.0 - 46.0 % Final    Physical Exam:  General: alert Lochia: appropriate Uterine Fundus: firm Incision: healing well DVT Evaluation: No evidence of DVT seen on physical exam.  Discharge Diagnoses: Term Pregnancy-delivered  Discharge Information: Date: 05/19/2012 Activity: pelvic rest Diet: routine Medications: Percocet Condition: stable Instructions: refer to practice specific booklet Discharge to: home Follow-up Information    Call in 6 weeks to follow up.   Contact information:   c harper         Newborn Data: Live born female  Birth Weight: 5 lb 11.4 oz (2591 g) APGAR: 9, 9  Home with mother.  MARSHALL,BERNARD A 05/19/2012, 7:27 AM

## 2012-05-20 NOTE — Progress Notes (Signed)
Post discharge chart review completed.  

## 2012-05-24 ENCOUNTER — Ambulatory Visit (HOSPITAL_COMMUNITY): Payer: Medicaid Other

## 2012-12-31 IMAGING — CR DG FOOT COMPLETE 3+V*R*
3 series · 3 of 3 positions shown · non-contrast
Comparison: None.

CLINICAL DATA: Fourth toe pain and bruising.

EXAM:
RIGHT FOOT COMPLETE - 3+ VIEW

[x foot ap right]
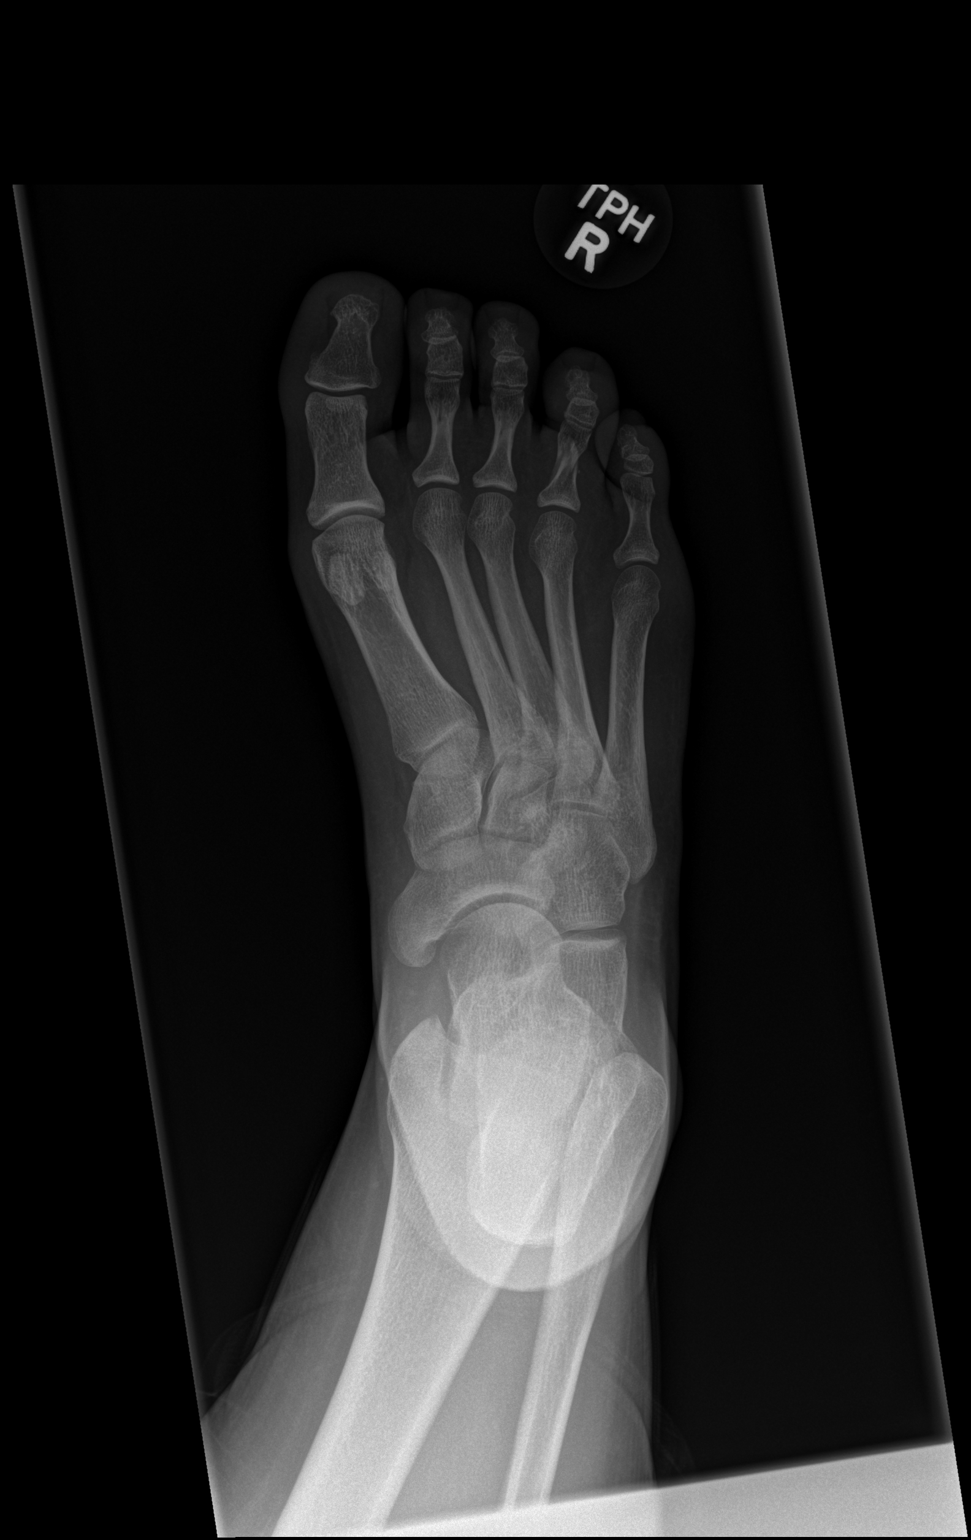

[x foot obl right]
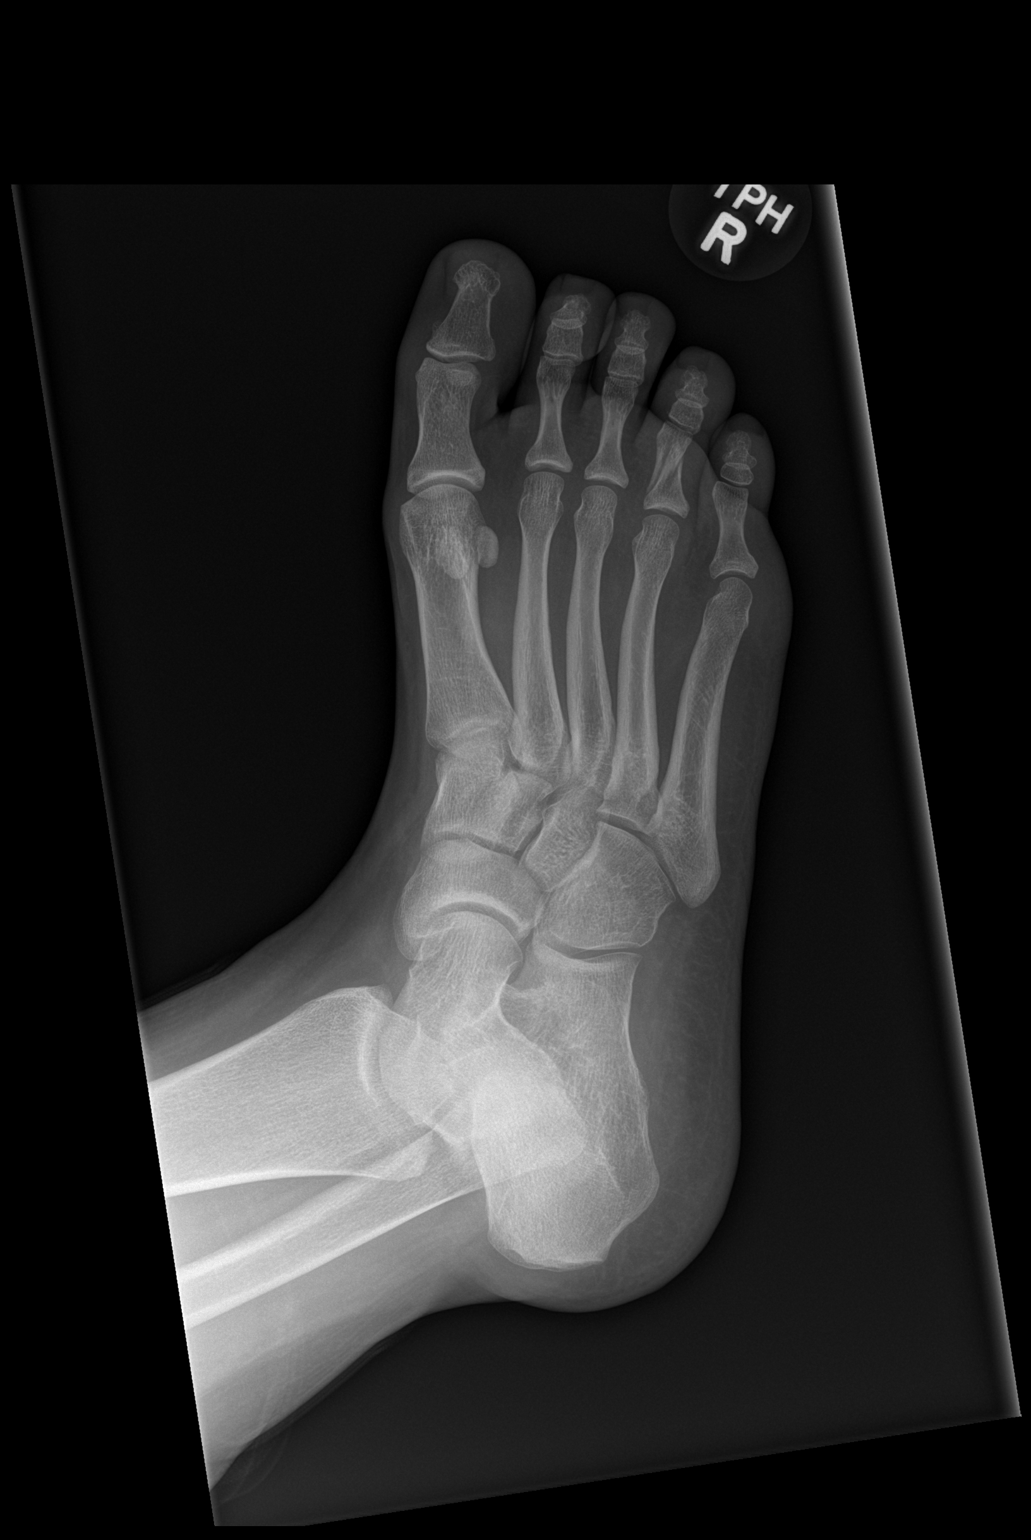

[x foot lat right]
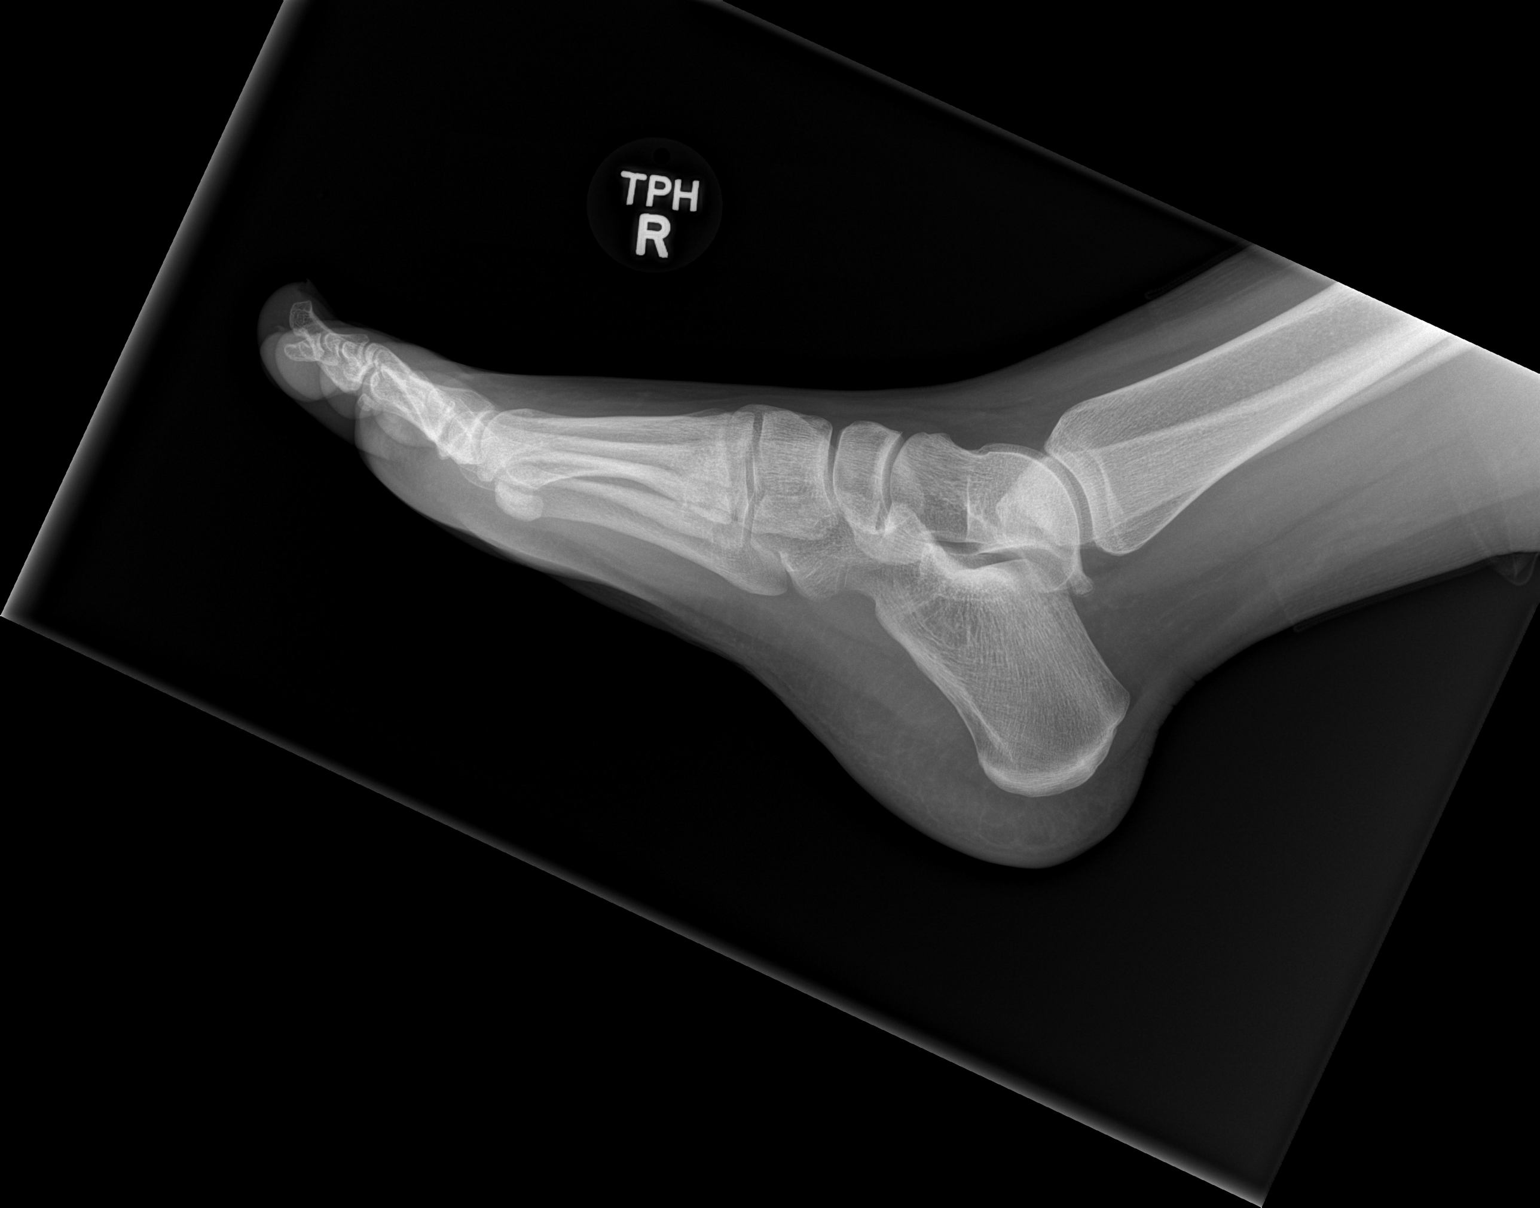

[3 of 3 positions shown; findings below may reference images not displayed]

FINDINGS: There is an oblique midshaft fracture of the fourth toe proximal
phalanx. The fracture is slightly medially displaced. There is no
dislocation. Bone mineralization appears within normal limits.
Fourth toe soft tissue swelling is present. No radiopaque foreign
body is seen.
IMPRESSION: Minimally displaced fourth toe proximal phalanx fracture.

## 2013-08-09 ENCOUNTER — Encounter (HOSPITAL_COMMUNITY): Payer: Self-pay | Admitting: Emergency Medicine

## 2013-08-09 ENCOUNTER — Emergency Department (HOSPITAL_COMMUNITY): Payer: Medicaid Other

## 2013-08-09 ENCOUNTER — Emergency Department (HOSPITAL_COMMUNITY)
Admission: EM | Admit: 2013-08-09 | Discharge: 2013-08-09 | Disposition: A | Discharge status: Home / Self Care | Payer: Self-pay | Attending: Emergency Medicine | Admitting: Emergency Medicine

## 2013-08-09 DIAGNOSIS — M25476 Effusion, unspecified foot: Secondary | ICD-10-CM | POA: Insufficient documentation

## 2013-08-09 DIAGNOSIS — F1210 Cannabis abuse, uncomplicated: Secondary | ICD-10-CM | POA: Insufficient documentation

## 2013-08-09 DIAGNOSIS — M25473 Effusion, unspecified ankle: Secondary | ICD-10-CM | POA: Insufficient documentation

## 2013-08-09 DIAGNOSIS — F411 Generalized anxiety disorder: Secondary | ICD-10-CM | POA: Insufficient documentation

## 2013-08-09 DIAGNOSIS — Y939 Activity, unspecified: Secondary | ICD-10-CM | POA: Insufficient documentation

## 2013-08-09 DIAGNOSIS — M19039 Primary osteoarthritis, unspecified wrist: Secondary | ICD-10-CM | POA: Insufficient documentation

## 2013-08-09 DIAGNOSIS — R51 Headache: Secondary | ICD-10-CM | POA: Insufficient documentation

## 2013-08-09 DIAGNOSIS — IMO0002 Reserved for concepts with insufficient information to code with codable children: Secondary | ICD-10-CM | POA: Insufficient documentation

## 2013-08-09 DIAGNOSIS — S92501A Displaced unspecified fracture of right lesser toe(s), initial encounter for closed fracture: Secondary | ICD-10-CM

## 2013-08-09 DIAGNOSIS — S92919A Unspecified fracture of unspecified toe(s), initial encounter for closed fracture: Secondary | ICD-10-CM | POA: Insufficient documentation

## 2013-08-09 DIAGNOSIS — R209 Unspecified disturbances of skin sensation: Secondary | ICD-10-CM | POA: Insufficient documentation

## 2013-08-09 DIAGNOSIS — IMO0001 Reserved for inherently not codable concepts without codable children: Secondary | ICD-10-CM | POA: Insufficient documentation

## 2013-08-09 DIAGNOSIS — Y929 Unspecified place or not applicable: Secondary | ICD-10-CM | POA: Insufficient documentation

## 2013-08-09 DIAGNOSIS — F17200 Nicotine dependence, unspecified, uncomplicated: Secondary | ICD-10-CM | POA: Insufficient documentation

## 2013-08-09 MED ORDER — TRAMADOL HCL 50 MG PO TABS: 50.0000 mg | ORAL_TABLET | Freq: Four times a day (QID) | ORAL | Status: DC | PRN

## 2013-08-09 MED ORDER — IBUPROFEN 800 MG PO TABS
800.0000 mg | ORAL_TABLET | Freq: Once | ORAL | Status: AC
  Administered 2013-08-09: 800 mg via ORAL
  Filled 2013-08-09: qty 1

## 2013-08-09 MED ORDER — IBUPROFEN 600 MG PO TABS: 600.0000 mg | ORAL_TABLET | Freq: Four times a day (QID) | ORAL | Status: DC | PRN

## 2013-08-09 NOTE — ED Provider Notes (Signed)
CSN: [id number]     Arrival date & time 08/09/13  0327 History   First MD Initiated Contact with Patient 08/09/13 [phone]     Chief Complaint  Patient presents with  . Toe Pain     (Consider location/radiation/quality/duration/timing/severity/associated sxs/prior Treatment) HPI Comments: Patient presents for pain to her right fourth toe. Patient states that she stubbed her toe on the corner of a door yesterday causing onset of pain. Patient describes the pain as a constant aching. She states that it radiates to her mid right foot. She also states it is intermittently sharp at her fourth metatarsal.  Patient is a 23 y.o. female presenting with toe pain. The history is provided by the patient. No language interpreter was used.  Toe Pain This is a new problem. The current episode started yesterday. The problem occurs constantly. The problem has been unchanged. Associated symptoms include arthralgias, joint swelling and myalgias. Pertinent negatives include no fever, numbness or weakness. The symptoms are aggravated by walking and standing. Treatments tried: BC powders and tylenol. The treatment provided mild relief.    Past Medical History  Diagnosis Date  . Anxiety     no meds  . Headache(784.0)     otc med prn  . Arthritis     right lower arm tendonitis   Past Surgical History  Procedure Laterality Date  . Foot surgery  1997    removal foreign body (sewing needle)  . Tooth extraction  2008    upper incisor x2  . Cesarean section  05/16/2012    Procedure: CESAREAN SECTION;  Surgeon: Brock Bad, MD;  Location: [facility];  Service: Obstetrics;  Laterality: N/A;  Primary   Family History  Problem Relation Age of Onset  . Anesthesia problems Neg Hx   . Hypotension Neg Hx   . Malignant hyperthermia Neg Hx   . Pseudochol deficiency Neg Hx    History  Substance Use Topics  . Smoking status: Current Every Day Smoker -- 0.50 packs/day for 4 years    Types: Cigarettes  . Smokeless  tobacco: Former Neurosurgeon  . Alcohol Use: No     Comment: ocassionaly    OB History   Grav Para Term Preterm Abortions TAB SAB Ect Mult Living   1 1 1       1      Obstetric Comments   1 st pregnancy , unplanned      Review of Systems  Constitutional: Negative for fever.  Musculoskeletal: Positive for arthralgias, joint swelling and myalgias. Negative for gait problem.  Skin: Positive for color change (ecchymosis). Negative for pallor.  Neurological: Negative for weakness and numbness.  All other systems reviewed and are negative.     Allergies  Review of patient's allergies indicates no known allergies.  Home Medications   Current Outpatient Rx  Name  Route  Sig  Dispense  Refill  . acetaminophen (TYLENOL) 325 MG suppository   Rectal   Place 325 mg rectally every 4 (four) hours as needed for mild pain.         . Aspirin-Caffeine 845-65 MG PACK   Oral   Take 1 packet by mouth once.          BP 129/85  Pulse 106  Temp(Src) 98.9 F (37.2 C) (Oral)  Resp 20  Ht 5\' 2"  (1.575 m)  Wt 162 lb (73.483 kg)  BMI 29.62 kg/m2  SpO2 100%  LMP 07/30/2013  Physical Exam  Nursing note and vitals reviewed. Constitutional: She  is oriented to person, place, and time. She appears well-developed and well-nourished. No distress.  HENT:  Head: Normocephalic and atraumatic.  Eyes: Conjunctivae and EOM are normal. No scleral icterus.  Neck: Normal range of motion.  Cardiovascular: Normal rate, regular rhythm and intact distal pulses.   DP and PT pulses 2+ bilaterally. Capillary refill normal in all digits of R foot.  Pulmonary/Chest: Effort normal. No respiratory distress.  Musculoskeletal: Normal range of motion. She exhibits tenderness.       Right foot: She exhibits tenderness, bony tenderness and swelling. She exhibits normal range of motion, normal capillary refill, no deformity and no laceration.       Feet:  Patient able to wiggle all toes of R foot.  Neurological: She is  alert and oriented to person, place, and time.  No gross sensory deficits appreciated. Patient moves extremities without ataxia.  Skin: Skin is warm and dry. No rash noted. She is not diaphoretic. No erythema. No pallor.  Patient with ecchymosis to her right fourth toe extending proximally to the mid dorsal surface of her right foot.  Psychiatric: She has a normal mood and affect. Her behavior is normal.    ED Course  Procedures (including critical care time) Labs Review Labs Reviewed - No data to display  Imaging Review Dg Foot Complete Right  08/09/2013   CLINICAL DATA:  Fourth toe pain and bruising.  EXAM: RIGHT FOOT COMPLETE - 3+ VIEW  COMPARISON:  None.  FINDINGS: There is an oblique midshaft fracture of the fourth toe proximal phalanx. The fracture is slightly medially displaced. There is no dislocation. Bone mineralization appears within normal limits. Fourth toe soft tissue swelling is present. No radiopaque foreign body is seen.  IMPRESSION: Minimally displaced fourth toe proximal phalanx fracture.   Electronically Signed   By: Sebastian AcheAllen  Grady   On: 08/09/2013 05:06    EKG Interpretation   None       MDM   Final diagnoses:  Closed fracture of fourth toe of right foot   Patient presents for pain to her fourth toe of her right foot after stubbing her toe on the corner of a door yesterday. Patient is neurovascularly intact on physical exam. Capillary refill normal as well as distal pulses. Patient has normal sensation to light touch in her right foot and at the distal tips of all toes. No evidence of septic joint or infectious process. X-ray significant for minimally displaced fracture of proximal phalanx of fourth toe. Patient given postop shoe in ED as well as ibuprofen and ice for symptoms. She is stable for discharge today with referral to orthopedics if symptoms persist. RICE advised and return precautions discussed. Patient agreeable to plan with no unaddressed  concerns.   Filed Vitals:   08/09/13 0331  BP: 129/85  Pulse: 106  Temp: 98.9 F (37.2 C)  TempSrc: Oral  Resp: 20  Height: 5\' 2"  (1.575 m)  Weight: 162 lb (73.483 kg)  SpO2: 100%       Antony MaduraKelly Mattias Walmsley, PA-C 08/09/13 [id number]

## 2013-08-09 NOTE — ED Provider Notes (Signed)
Medical screening examination/treatment/procedure(s) were performed by non-physician practitioner and as supervising physician I was immediately available for consultation/collaboration.  EKG Interpretation   None         Shine Scrogham, MD 08/09/13 0534 

## 2013-08-09 NOTE — ED Notes (Signed)
Pt states that yesterday afternoon she stubbed her toe on the corner of the door and the thinks she broke it.

## 2013-08-09 NOTE — Discharge Instructions (Signed)
Take ibuprofen for pain control and Tramadol as needed for severe pain. Use a post op shoe for comfort.  Toe Fracture Your caregiver has diagnosed you as having a fractured toe. A toe fracture is a break in the bone of a toe. "Buddy taping" is a way of splinting your broken toe, by taping the broken toe to the toe next to it. This "buddy taping" will keep the injured toe from moving beyond normal range of motion. Buddy taping also helps the toe heal in a more normal alignment. It may take 6 to 8 weeks for the toe injury to heal. HOME CARE INSTRUCTIONS   Leave your toes taped together for as long as directed by your caregiver or until you see a doctor for a follow-up examination. You can change the tape after bathing. Always use a small piece of gauze or cotton between the toes when taping them together. This will help the skin stay dry and prevent infection.  Apply ice to the injury for 15-20 minutes each hour while awake for the first 2 days. Put the ice in a plastic bag and place a towel between the bag of ice and your skin.  After the first 2 days, apply heat to the injured area. Use heat for the next 2 to 3 days. Place a heating pad on the foot or soak the foot in warm water as directed by your caregiver.  Keep your foot elevated as much as possible to lessen swelling.  Wear sturdy, supportive shoes. The shoes should not pinch the toes or fit tightly against the toes.  Your caregiver may prescribe a rigid shoe if your foot is very swollen.  Your may be given crutches if the pain is too great and it hurts too much to walk.  Only take over-the-counter or prescription medicines for pain, discomfort, or fever as directed by your caregiver.  If your caregiver has given you a follow-up appointment, it is very important to keep that appointment. Not keeping the appointment could result in a chronic or permanent injury, pain, and disability. If there is any problem keeping the appointment, you must  call back to this facility for assistance. SEEK MEDICAL CARE IF:   You have increased pain or swelling, not relieved with medications.  The pain does not get better after 1 week.  Your injured toe is cold when the others are warm. SEEK IMMEDIATE MEDICAL CARE IF:   The toe becomes cold, numb, or white.  The toe becomes hot (inflamed) and red. Document Released: 06/09/2000 Document Revised: 09/04/2011 Document Reviewed: 01/27/2008 [facility] Patient Information 2014 [employer], [place].  Buddy Taping of Toes We have taped your toes together to keep them from moving. This is called "buddy taping" since we used a part of your own body to keep the injured part still. We placed soft padding between your toes to keep them from rubbing against each other. Buddy taping will help with healing and to reduce pain. Keep your toes buddy taped together for as long as directed by your caregiver. HOME CARE INSTRUCTIONS   Raise your injured area above the level of your heart while sitting or lying down. Prop it up with pillows.  An ice pack used every twenty minutes, while awake, for the first one to two days may be helpful. Put ice in a plastic bag and put a towel between the bag and your skin.  Watch for signs that the taping is too tight. These signs may be:  Numbness  of your taped toes.  Coolness of your taped toes.  Color change in the area beyond the tape.  Increased pain.  If you have any of these signs, loosen or rewrap the tape. If you need to loosen or rewrap the buddy tape, make sure you use the padding again. SEEK IMMEDIATE MEDICAL CARE IF:   You have worse pain, swelling, inflammation (soreness), drainage or bleeding after you rewrap the tape.  Any new problems occur. MAKE SURE YOU:   Understand these instructions.  Will watch your condition.  Will get help right away if you are not doing well or get worse. Document Released: 03/16/2004 Document Revised: 09/04/2011 Document  Reviewed: 06/09/2008 [facility] Patient Information 2014 [place], [place]. RICE: Routine Care for Injuries Rest, Ice, Compression, and Elevation (RICE) are often used to care for injuries. HOME CARE  Rest your injury.  Put ice on the injury.  Put ice in a plastic bag.  Place a towel between your skin and the bag.  Leave the ice on for 15-20 minutes, 03-04 times a day. Do this for as long as told by your doctor.  Apply pressure (compression) with an elastic bandage. Remove and reapply the bandage every 3 to 4 hours. Do not wrap the bandage too tight. Wrap the bandage looser if the fingers or toes are puffy (swollen), blue, cold, painful, or lose feeling (numb).  Raise (elevate) your injury. Raise your injury above the heart if you can. GET HELP RIGHT AWAY IF:  You have lasting pain or puffiness.  Your injury is red, weak, or loses feeling.  Your problems get worse, not better, after several days. MAKE SURE YOU:  Understand these instructions.  Will watch your condition.  Will get help right away if you are not doing well or get worse. Document Released: 11/29/2007 Document Revised: 09/04/2011 Document Reviewed: 11/11/2010 [facility] Patient Information 2014 [place], [place].

## 2013-11-07 ENCOUNTER — Encounter (HOSPITAL_COMMUNITY): Payer: Self-pay | Admitting: Emergency Medicine

## 2013-11-07 ENCOUNTER — Emergency Department (HOSPITAL_COMMUNITY)
Admission: EM | Admit: 2013-11-07 | Discharge: 2013-11-07 | Disposition: A | Discharge status: Home / Self Care | Payer: Medicaid Other | Attending: Emergency Medicine | Admitting: Emergency Medicine

## 2013-11-07 DIAGNOSIS — K029 Dental caries, unspecified: Secondary | ICD-10-CM | POA: Insufficient documentation

## 2013-11-07 DIAGNOSIS — K047 Periapical abscess without sinus: Secondary | ICD-10-CM | POA: Insufficient documentation

## 2013-11-07 DIAGNOSIS — F17200 Nicotine dependence, unspecified, uncomplicated: Secondary | ICD-10-CM | POA: Insufficient documentation

## 2013-11-07 DIAGNOSIS — M129 Arthropathy, unspecified: Secondary | ICD-10-CM | POA: Insufficient documentation

## 2013-11-07 DIAGNOSIS — Z8659 Personal history of other mental and behavioral disorders: Secondary | ICD-10-CM | POA: Insufficient documentation

## 2013-11-07 MED ORDER — PENICILLIN V POTASSIUM 500 MG PO TABS: 500.0000 mg | ORAL_TABLET | Freq: Four times a day (QID) | ORAL | Status: AC

## 2013-11-07 MED ORDER — IBUPROFEN 800 MG PO TABS: 800.0000 mg | ORAL_TABLET | Freq: Three times a day (TID) | ORAL | Status: DC

## 2013-11-07 NOTE — ED Provider Notes (Signed)
CSN: [id number]     Arrival date & time 11/07/13  0152 History   First MD Initiated Contact with Patient 11/07/13 0239     Chief Complaint  Patient presents with  . Dental Pain     (Consider location/radiation/quality/duration/timing/severity/associated sxs/prior Treatment) HPI HX per PT - L lower molar dental pain x 2 days, sharp and severe hurts to chew, does not have a DDS. No F/C, no facial swelling, no trouble swallowing or breathing. No h/o same.   Past Medical History  Diagnosis Date  . Anxiety     no meds  . Headache(784.0)     otc med prn  . Arthritis     right lower arm tendonitis   Past Surgical History  Procedure Laterality Date  . Foot surgery  1997    removal foreign body (sewing needle)  . Tooth extraction  2008    upper incisor x2  . Cesarean section  05/16/2012    Procedure: CESAREAN SECTION;  Surgeon: Brock Badharles A Harper, MD;  Location: [facility];  Service: Obstetrics;  Laterality: N/A;  Primary   Family History  Problem Relation Age of Onset  . Anesthesia problems Neg Hx   . Hypotension Neg Hx   . Malignant hyperthermia Neg Hx   . Pseudochol deficiency Neg Hx   . Seizures Father   . Diabetes Other   . CAD Other   . Hypertension Other    History  Substance Use Topics  . Smoking status: Current Every Day Smoker -- 0.50 packs/day for 4 years    Types: Cigarettes  . Smokeless tobacco: Former NeurosurgeonUser  . Alcohol Use: No     Comment: ocassionaly    OB History   Grav Para Term Preterm Abortions TAB SAB Ect Mult Living   1 1 1       1      Obstetric Comments   1 st pregnancy , unplanned     Review of Systems  Constitutional: Negative for fever and chills.  HENT: Positive for dental problem. Negative for congestion and drooling.   Eyes: Negative for pain.  Respiratory: Negative for shortness of breath.   Cardiovascular: Negative for chest pain.  Gastrointestinal: Negative for abdominal pain.  Genitourinary: Negative for dysuria.  Musculoskeletal:  Negative for back pain, neck pain and neck stiffness.  Skin: Negative for rash.  Neurological: Negative for headaches.  All other systems reviewed and are negative.     Allergies  Review of patient's allergies indicates no known allergies.  Home Medications   Prior to Admission medications   Medication Sig Start Date End Date Taking? Authorizing Provider  aspirin EC 81 MG tablet Take 81 mg by mouth every 4 (four) hours as needed for moderate pain.   Yes Historical Provider, MD  ibuprofen (ADVIL,MOTRIN) 600 MG tablet Take 1 tablet (600 mg total) by mouth every 6 (six) hours as needed. 08/09/13  Yes Kelly Humes, PA-C   BP 124/70  Pulse 94  Temp(Src) 98.1 F (36.7 C) (Oral)  Resp 20  SpO2 100%  LMP 11/04/2013 Physical Exam  Constitutional: She is oriented to person, place, and time. She appears well-developed and well-nourished.  HENT:  Head: Normocephalic and atraumatic.  Mouth/Throat: Oropharynx is clear and moist.  Uvula midline. TTP L lower 3rd molar with carries. No facial edema, no trismus, no gingival pointing or fluctuance  Eyes: EOM are normal. Pupils are equal, round, and reactive to light.  Neck: Neck supple. No tracheal deviation present.  Cardiovascular: Normal rate, regular  rhythm and intact distal pulses.   Pulmonary/Chest: Effort normal and breath sounds normal. No stridor. No respiratory distress.  Musculoskeletal: Normal range of motion. She exhibits no edema.  Neurological: She is alert and oriented to person, place, and time.  Skin: Skin is warm and dry.    ED Course  Dental Date/Time: 11/07/2013 4:00 AM Performed by: Sunnie NielsenPITZ, Moselle Rister Authorized by: Sunnie NielsenPITZ, Yvanna Vidas Consent: Verbal consent obtained. Risks and benefits: risks, benefits and alternatives were discussed Consent given by: patient Patient understanding: patient states understanding of the procedure being performed Required items: required blood products, implants, devices, and special equipment  available Patient identity confirmed: verbally with patient Time out: Immediately prior to procedure a "time out" was called to verify the correct patient, procedure, equipment, support staff and site/side marked as required. Preparation: Patient was prepped and draped in the usual sterile fashion. Local anesthesia used: yes Local anesthetic: bupivacaine 0.5% with epinephrine Anesthetic total: 1.8 ml Patient tolerance: Patient tolerated the procedure well with no immediate complications. Comments: Local injection L lower 3rd molar with good pain control   (including critical care time) Labs Review Labs Reviewed - No data to display  Imaging Review No results found.   EKG Interpretation None      MDM   Dx: Dental abscess  dental block DDS referral Rx ABx and NSAIDs VS and nurses notes reviewed and considered   Sunnie NielsenBrian Guadalupe Nickless, MD 11/07/13 [phone]

## 2013-11-07 NOTE — ED Notes (Signed)
Pt states she has a toothache on the left bottom  Pt states pain started to get really bad on Wednesday night  Pt has slight swelling noted to the left side

## 2013-11-07 NOTE — ED Notes (Signed)
MD at bedside. 

## 2013-11-07 NOTE — Discharge Instructions (Signed)
Dental Abscess °A dental abscess is a collection of infected fluid (pus) from a bacterial infection in the inner part of the tooth (pulp). It usually occurs at the end of the tooth's root.  °CAUSES  °· Severe tooth decay. °· Trauma to the tooth that allows bacteria to enter into the pulp, such as a broken or chipped tooth. °SYMPTOMS  °· Severe pain in and around the infected tooth. °· Swelling and redness around the abscessed tooth or in the mouth or face. °· Tenderness. °· Pus drainage. °· Bad breath. °· Bitter taste in the mouth. °· Difficulty swallowing. °· Difficulty opening the mouth. °· Nausea. °· Vomiting. °· Chills. °· Swollen neck glands. °DIAGNOSIS  °· A medical and dental history will be taken. °· An examination will be performed by tapping on the abscessed tooth. °· X-rays may be taken of the tooth to identify the abscess. °TREATMENT °The goal of treatment is to eliminate the infection. You may be prescribed antibiotic medicine to stop the infection from spreading. A root canal may be performed to save the tooth. If the tooth cannot be saved, it may be pulled (extracted) and the abscess may be drained.  °HOME CARE INSTRUCTIONS °· Only take over-the-counter or prescription medicines for pain, fever, or discomfort as directed by your caregiver. °· Rinse your mouth (gargle) often with salt water (¼ tsp salt in 8 oz [250 ml] of warm water) to relieve pain or swelling. °· Do not drive after taking pain medicine (narcotics). °· Do not apply heat to the outside of your face. °· Return to your dentist for further treatment as directed. °SEEK MEDICAL CARE IF: °· Your pain is not helped by medicine. °· Your pain is getting worse instead of better. °SEEK IMMEDIATE MEDICAL CARE IF: °· You have a fever or persistent symptoms for more than 2 3 days. °· You have a fever and your symptoms suddenly get worse. °· You have chills or a very bad headache. °· You have problems breathing or swallowing. °· You have trouble  opening your mouth. °· You have swelling in the neck or around the eye. °Document Released: 06/12/2005 Document Revised: 03/06/2012 Document Reviewed: 09/20/2010 °ExitCare® Patient Information ©2014 [employer]. ° ° °Emergency Department Resource Guide °1) Find a Doctor and Pay Out of Pocket °Although you won't have to find out who is covered by your insurance plan, it is a good idea to ask around and get recommendations. You will then need to call the office and see if the doctor you have chosen will accept you as a new patient and what types of options they offer for patients who are self-pay. Some doctors offer discounts or will set up payment plans for their patients who do not have insurance, but you will need to ask so you aren't surprised when you get to your appointment. ° °2) Contact Your Local Health Department °Not all health departments have doctors that can see patients for sick visits, but many do, so it is worth a call to see if yours does. If you don't know where your local health department is, you can check in your phone book. The CDC also has a tool to help you locate your state's health department, and many state websites also have listings of all of their local health departments. ° °3) Find a Walk-in Clinic °If your illness is not likely to be very severe or complicated, you may want to try a walk in clinic. These are popping up all over   the country in pharmacies, drugstores, and shopping centers. They're usually staffed by nurse practitioners or physician assistants that have been trained to treat common illnesses and complaints. They're usually fairly quick and inexpensive. However, if you have serious medical issues or chronic medical problems, these are probably not your best option. ° °No Primary Care Doctor: °- Call [facility] at  832-8000 - they can help you locate a primary care doctor that  accepts your insurance, provides certain services, etc. °- Physician Referral Service-  1-800-533-3463 ° °Chronic Pain Problems: °Organization         Address  Phone   Notes  °[facility]  (336) 297-2271 Patients need to be referred by their primary care doctor.  ° °Medication Assistance: °Organization         Address  Phone   Notes  °[place] [facility] [address] °[place], [place] [address] (336) 641-8030 --Must be a resident of [place] °-- Must have NO insurance coverage whatsoever (no Medicaid/ Medicare, etc.) °-- The pt. MUST have a primary care doctor that directs their care regularly and follows them in the community °  °[facility]  (866) 331-1348   °[employer]  (888) 892-1162   ° °Agencies that provide inexpensive medical care: °Organization         Address  Phone   Notes  °[facility]  (336) 832-8035   °[facility]    (336) 832-7272   °[facility] [address] °[place], [place] [address] (336) 832-4777   °[facility] [address], °[place] (336) 271-4999   °[facility]    (336) 373-0678   °[facility]    (336) 272-1050   °[facility] ° [address], [place] Phone:  (336) 832-4444, Fax:  (336) 832-4440 Hours of Operation:  9 am - 6 pm, M-F.  Also accepts Medicaid/Medicare and self-pay.  °[facility] ° [address], [place] Phone: (336) 832-3150, Fax: (336) 832-3151. Hours of Operation:  8:30 am - 5:30 pm, M-F.  Also accepts Medicaid and self-pay.  °[facility] [place] [address], [place] Phone: (336) 878-6027   °[facility] [address], [place], [place] (336)723-1848, Ext. 123 Mondays & Thursdays: 7-9 AM.  First 15 patients are seen on a first come, first serve basis. °  ° °Medicaid-accepting [place] Providers: ° °Organization         Address  Phone   Notes  °[facility] [address],  [place] (336) 641-2100 Also accepts self-pay patients.  °[facility] [address], [place] ° (336) 856-9996   °[facility] [address], [place] (336) 288-8857   °[facility] [address] [place] [address], [place] (336) 299-7000   °Veita Bland [address], [place]  ° (336) 373-1557 Only accepts [place] Access Medicaid patients after they have their name applied to their card.  ° °Self-Pay (no insurance) in [place]: ° °Organization         Address  Phone   Notes  °Sickle Cell Patients, [facility] [address], [place] (336) 832-1970   °[facility] [address],  (336) 832-4400   °[facility] ° [address], [place] (336) 992-4800   °[facility]/Dr. Osei-Bonsu ° [address] [place] [address], [place] or [address], [place] (336) 841-8500 Phone number for both [place] and [place] locations is the same.  °[facility] [address], [place] (336) 299-0000   °[facility] [address] [place] [address], [place] or [address] (336) 852-7530 °(336) 878-2260   °[facility] [address], [place] (336) 350-1642, phone; (336) 294-5005, fax Sees patients 1st and 3rd Saturday of every month.  Must not qualify for public or private insurance (i.e. Medicaid, Medicare, [place] Health Choice, Veterans' Benefits) • Household income should be no more than 200% of the poverty level •The clinic cannot treat you if you are pregnant or think you are pregnant • Sexually transmitted diseases are not treated at the clinic.  ° ° °Dental Care: °Organization         Address  Phone  Notes  °[facility] [address], [place] (336) 641-6152 Accepts children up to age 21 who are enrolled in  Medicaid or [place] Health Choice; pregnant women with a Medicaid card; and children who have applied for Medicaid or [place] Health Choice, but were declined, whose parents can pay a reduced fee at time of service.  °[facility] [place]  [address], [place] (336) 641-7733 Accepts children up to age 21 who are enrolled in Medicaid or [place] Health Choice; pregnant women with a Medicaid card; and children who have applied for Medicaid or [place] Health Choice, but were declined, whose parents can pay a reduced fee at time of service.  °[facility] ° [address], [place] (336) 641-4533 Patients are seen by appointment only. Walk-ins are not accepted. [facility] will see patients 18 years of age and older. °Monday - Tuesday (8am-5pm) °Most Wednesdays (8:30-5pm) °$30 per visit, cash only  °[facility] ° [address], [place] (336) 641-4533 Patients are seen by appointment only. Walk-ins are not accepted. [facility] will see patients 18 years of age and older. °One Wednesday Evening (Monthly: Volunteer Based).  $30 per visit, cash only  °[facility]  (919) 537-3737 for adults; Children under age 4, call Graduate Pediatric Dentistry at (919) [phone]. Children aged 4-14, please call (919) [phone] to request a pediatric application. ° Dental services are provided in all areas of dental care including fillings, crowns and bridges, complete and partial dentures, implants, gum treatment, root canals, and extractions. Preventive care is also provided. Treatment is provided to both adults and children. °Patients are selected via a lottery and there is often a waiting list. °  °[facility] [address], ° ° (336) 763-8833 [email or web address] °  °[facility] [address], [place], [place] (336)723-1848, Ext. 123 Second and Fourth Thursday of each month, opens at 6:30  AM; Clinic ends at 9 AM.  Patients are seen on a first-come first-served basis, and a limited number are seen during each clinic.  ° °[facility] ° [address], [place], [place] (336) 723-7904   Eligibility Requirements °You must have lived in [place], [place], or [place] counties for at least the last three months. °  You cannot be eligible for state or federal sponsored healthcare insurance, including Veterans Administration, Medicaid, or Medicare. °    You generally cannot be eligible for healthcare insurance through your employer.  °  How to apply: °Eligibility screenings are held every Tuesday and Wednesday afternoon from 1:00 pm until 4:00 pm. You do not need an appointment for the interview!  °[facility] [address], [place], [place] 336-631-2330   °[facility]  336-342-8273   °[facility]  336-703-3100   °[facility]  336-570-6415   ° °Behavioral Health Resources in the Community: °Intensive Outpatient Programs °Organization         Address  Phone  Notes  °[facility] [address], [place], [place] 336-878-6098   °[facility] [address], [place], [place] 336-832-9800   °[facility] [address], [place], [place] ° 336-882-2125   °[facility] [address],  °[place], [place] 1-800-853-5163 or 336-641-4981   °Substance Abuse Resources °Organization         Address  Phone  Notes  °[facility]  336-882-2125   °[facility]  336-784-9470   °[facility]  336-285-9073   °[facility]  336-845-3988   °Residential & Outpatient Substance Abuse Program  1-800-659-3381   °Psychological Services °Organization         Address  Phone  Notes  °[facility]  336- 832-9600   °[facility]  336- 378-7881   °[facility] [address], [place] 1-800-853-5163 or  336-641-4981   ° °Mobile Crisis Teams °Organization         Address  Phone  Notes  °[facility], Mobile Crisis Care Unit  1-877-626-1772   °Assertive °[facility] ° [address] [place], [place] 336-834-9664   °Sharon DeEsch [address] °[place] 336-554-5454   ° °Self-Help/Support Groups °Organization         Address  Phone             Notes  °[facility] - variety of support groups  336- 373-1402 Call for more information  °Narcotics Anonymous (NA), [facility] [address], °[place]  2 meetings at this location  ° °Residential Treatment Programs °Organization         Address  Phone  Notes  °[facility] [address],    °[place]  1-866-801-8205   °[facility] ° [address], [place], [place] 704-293-8524   °[facility] [address], [place] 336-845-3988 Admissions: 8am-3pm M-F  °[facility] [address]    °[place], [place] 336-841-1104   °[facility] [address], [place], [place] 336-379-7146   °[facility] [address]  °[place], [place] 336-285-9073   °[facility] - Intensive Outpatient [address], [place], [place] 336-852-3033   °[facility] [address]  °[place], [place] 1-877-615-2722 or 336-784-9470   °[facility] [address] [place], [place] 336-227-7417 Accepts Medicaid  °[facility] [address]  °  1-800-659-3381 Substance Abuse/Addiction Treatment  ° °[place] Behavioral Health Resources °Organization         Address  Phone  Notes  °[facility]  (888) 581-9988   °Julie Brannon, PhD [address] [place],    (336) 349-5553 or (336) 951-0000   °[facility]   [address] °[place], [place] (336) 349-4454   °[facility] [address],  [place], [place] (336) 342-8316 Insurance/Medicaid/sponsorship through [facility]  °[facility] [address] [place]                                    [place], [place] (336) 342-8316 Therapy/tele-psych/case  °[facility] [address]  ° [place], [place] (336) 349-2233    °Dr. Arfeen  (336) 349-4544   °[facility] [place]  [employer] [place] [facility] 1) [address],  °2) [address], [place] °3)  [address], [place] (336) 349-3220 °(336) 342-7768 ° °(336) 342-8140   °[place] [facility] (336) 342-1394 or (336) 342-3537 (After Hours)    ° ° ° °

## 2014-04-27 ENCOUNTER — Encounter (HOSPITAL_COMMUNITY): Payer: Self-pay | Admitting: Emergency Medicine

## 2017-01-23 ENCOUNTER — Encounter: Payer: Self-pay | Admitting: Obstetrics

## 2017-01-23 ENCOUNTER — Other Ambulatory Visit (HOSPITAL_COMMUNITY)
Admission: RE | Admit: 2017-01-23 | Discharge: 2017-01-23 | Disposition: A | Discharge status: Home / Self Care | Payer: Medicaid Other | Source: Ambulatory Visit | Attending: Obstetrics | Admitting: Obstetrics

## 2017-01-23 ENCOUNTER — Ambulatory Visit (INDEPENDENT_AMBULATORY_CARE_PROVIDER_SITE_OTHER): Payer: Medicaid Other | Admitting: Obstetrics

## 2017-01-23 VITALS — BP 120/70 | HR 92 | Wt 154.0 lb

## 2017-01-23 DIAGNOSIS — N898 Other specified noninflammatory disorders of vagina: Secondary | ICD-10-CM | POA: Insufficient documentation

## 2017-01-23 DIAGNOSIS — Z30011 Encounter for initial prescription of contraceptive pills: Secondary | ICD-10-CM

## 2017-01-23 DIAGNOSIS — F17210 Nicotine dependence, cigarettes, uncomplicated: Secondary | ICD-10-CM

## 2017-01-23 DIAGNOSIS — Z3009 Encounter for other general counseling and advice on contraception: Secondary | ICD-10-CM

## 2017-01-23 DIAGNOSIS — Z308 Encounter for other contraceptive management: Secondary | ICD-10-CM | POA: Diagnosis not present

## 2017-01-23 DIAGNOSIS — N944 Primary dysmenorrhea: Secondary | ICD-10-CM

## 2017-01-23 DIAGNOSIS — Z01419 Encounter for gynecological examination (general) (routine) without abnormal findings: Secondary | ICD-10-CM

## 2017-01-23 MED ORDER — IBUPROFEN 800 MG PO TABS: 800.0000 mg | ORAL_TABLET | Freq: Three times a day (TID) | ORAL | 5 refills | Status: DC | PRN

## 2017-01-23 MED ORDER — VARENICLINE TARTRATE 0.5 MG X 11 & 1 MG X 42 PO MISC: ORAL | 0 refills | Status: DC

## 2017-01-23 MED ORDER — VARENICLINE TARTRATE 1 MG PO TABS: 1.0000 mg | ORAL_TABLET | Freq: Two times a day (BID) | ORAL | 1 refills | Status: DC

## 2017-01-23 MED ORDER — NORETHIN ACE-ETH ESTRAD-FE 1-20 MG-MCG(24) PO TABS: 1.0000 | ORAL_TABLET | Freq: Every day | ORAL | 11 refills | Status: DC

## 2017-01-23 NOTE — Progress Notes (Signed)
Patient is in the office for annual exam- she is experiencing pain with cycles.Patient states she misses work due to her pain with cycle-not on one side more than the other- patient does have more pain on the R. Patient states she has a moderate flow - lasting 4 days.

## 2017-01-23 NOTE — Progress Notes (Signed)
Subjective:        Sharon Sharon is Sharon 26 y.o. female here for Sharon routine exam.  Current complaints: Painful periods.  Four day duration with moderate flow..    Personal health questionnaire:  Is patient Sharon Sharon, have Sharon Sharon history of breast and/or ovarian cancer: no Is there Sharon Sharon history of uterine cancer diagnosed at age < 7750, gastrointestinal cancer, urinary tract cancer, Sharon member who is Sharon Sharon-associated carrier: no Is the patient overweight and hypertensive, Sharon history of diabetes, personal history of gestational diabetes, preeclampsia or PCOS: no Is patient over 5455, have PCOS,  Sharon history of premature CHD under age 26, diabetes, smoke, have hypertension or peripheral artery disease:  no At any time, has Sharon partner hit, kicked or otherwise hurt or frightened you?: no Over the past 2 weeks, have you felt down, depressed or hopeless?: no Over the past 2 weeks, have you felt little interest or pleasure in doing things?:no   Gynecologic History Patient's last menstrual period was 01/15/2017 (exact date). Contraception: condoms Last Pap: several years. Results were: normal Last mammogram: n/Sharon. Results were: n/Sharon  Obstetric History OB History  Gravida Para Term Preterm AB Living  1 1 1     1   SAB TAB Ectopic Multiple Live Births          1    # Outcome Date GA Lbr Len/2nd Weight Sex Delivery Anes PTL Lv  1 Term 05/16/12 [id number]  5 lb 11.4 oz (2.59 kg) M CS-LTranv Spinal  LIV    Obstetric Comments  1 st pregnancy , unplanned    Past Medical History:  Diagnosis Date  . Anxiety    no meds  . Arthritis    right lower arm tendonitis  . Carpal tunnel Sharon   . Headache(784.0)    otc med prn    Past Surgical History:  Procedure Laterality Date  . CESAREAN SECTION  05/16/2012   Procedure: CESAREAN SECTION;  Surgeon: Brock Badharles Sharon Harper, MD;  Location: [facility];  Service: Obstetrics;  Laterality: N/Sharon;  Primary  . FOOT SURGERY  1997   removal foreign body (sewing needle)  . TOOTH EXTRACTION  2008   upper incisor x2    No current outpatient prescriptions on file. No Known Allergies  Social History  Substance Use Topics  . Smoking status: Current Every Day Smoker    Packs/day: 0.25    Years: 4.00    Types: Cigarettes  . Smokeless tobacco: Former NeurosurgeonUser  . Alcohol use No     Comment: ocassionaly     Sharon History  Problem Relation Age of Onset  . Seizures Father   . Diabetes Other   . CAD Other   . Hypertension Other   . Anesthesia problems Neg Hx   . Hypotension Neg Hx   . Malignant hyperthermia Neg Hx   . Pseudochol deficiency Neg Hx       Review of Systems  Constitutional: negative for fatigue and weight loss Respiratory: negative for cough and wheezing Cardiovascular: negative for chest pain, fatigue and palpitations Gastrointestinal: negative for abdominal pain and change in bowel habits Musculoskeletal:negative for myalgias Neurological: negative for gait problems and tremors Behavioral/Psych: negative for abusive relationship, depression Endocrine: negative for temperature intolerance    Genitourinary:negative for abnormal menstrual periods, genital lesions, hot flashes, sexual problems and vaginal discharge Integument/breast: negative for breast lump, breast tenderness, nipple discharge and skin lesion(s)    Objective:  BP 120/70   Pulse 92   Wt 154 lb (69.9 kg)   LMP 01/15/2017 (Exact Date)   Breastfeeding? No   BMI 28.17 kg/m  General:   alert  Skin:   no rash or abnormalities  Lungs:   clear to auscultation bilaterally  Heart:   regular rate and rhythm, S1, S2 normal, no murmur, click, rub or gallop  Breasts:   normal without suspicious masses, skin or nipple changes or axillary nodes  Abdomen:  normal findings: no organomegaly, soft, non-tender and no hernia  Pelvis:  External genitalia: normal general appearance Urinary system: urethral meatus normal and bladder without  fullness, nontender Vaginal: normal without tenderness, induration or masses Cervix: normal appearance Adnexa: normal bimanual exam Uterus: anteverted and non-tender, normal size   Lab Review Urine pregnancy test Labs reviewed yes Radiologic studies reviewed no  50% of 20 min visit spent on counseling and coordination of care.    Assessment:     1. Encounter for routine gynecological examination with Papanicolaou smear of cervix Rx: - Cytology - PAP  2. Primary dysmenorrhea Rx: - ibuprofen (ADVIL,MOTRIN) 800 MG tablet; Take 1 tablet (800 mg total) by mouth every 8 (eight) hours as needed.  Dispense: 60 tablet; Refill: 5  3. Encounter for other general counseling and advice on contraception - wants OCP's  4. Encounter for initial prescription of contraceptive pills Rx: - Norethindrone Acetate-Ethinyl Estrad-FE (LOESTRIN 24 FE) 1-20 MG-MCG(24) tablet; Take 1 tablet by mouth daily. Start pills on 1st day of period.  Dispense: 1 Package; Refill: 11  5. Tobacco dependence due to cigarettes Rx: - varenicline (CHANTIX STARTING MONTH PAK) 0.5 MG X 11 & 1 MG X 42 tablet; Take one 0.5 mg tablet by mouth once daily for 3 days, then increase to one 0.5 mg tablet twice daily for 4 days, then increase to one 1 mg tablet twice daily.  Dispense: 53 tablet; Refill: 0 - varenicline (CHANTIX CONTINUING MONTH PAK) 1 MG tablet; Take 1 tablet (1 mg total) by mouth 2 (two) times daily.  Dispense: 60 tablet; Refill: 1  6. Vaginal discharge Rx: - Cervicovaginal ancillary only   Plan:    Education reviewed: calcium supplements, depression evaluation, low fat, low cholesterol diet, safe sex/STD prevention, self breast exams, smoking cessation and weight bearing exercise. Contraception: OCP (estrogen/progesterone). Follow up in: 1 year.   No orders of the defined types were placed in this encounter.  No orders of the defined types were placed in this encounter.       Patient ID: Sharon Sharon  Beel, female   DOB: 11/11/1990, 26 y.o.   MRN: [id number]

## 2017-01-24 LAB — CYTOLOGY - PAP: DIAGNOSIS: NEGATIVE

## 2017-01-26 LAB — CERVICOVAGINAL ANCILLARY ONLY
CHLAMYDIA, DNA PROBE: NEGATIVE
NEISSERIA GONORRHEA: NEGATIVE

## 2017-02-10 ENCOUNTER — Inpatient Hospital Stay (HOSPITAL_COMMUNITY)
Admission: EM | Admit: 2017-02-10 | Discharge: 2017-02-14 | DRG: 917 | Disposition: A | Discharge status: Home / Self Care | Payer: Self-pay | Attending: Internal Medicine | Admitting: Internal Medicine

## 2017-02-10 ENCOUNTER — Inpatient Hospital Stay (HOSPITAL_COMMUNITY): Payer: Self-pay

## 2017-02-10 ENCOUNTER — Encounter (HOSPITAL_COMMUNITY): Payer: Self-pay | Admitting: *Deleted

## 2017-02-10 DIAGNOSIS — F419 Anxiety disorder, unspecified: Secondary | ICD-10-CM | POA: Diagnosis present

## 2017-02-10 DIAGNOSIS — G92 Toxic encephalopathy: Secondary | ICD-10-CM | POA: Diagnosis present

## 2017-02-10 DIAGNOSIS — Z793 Long term (current) use of hormonal contraceptives: Secondary | ICD-10-CM

## 2017-02-10 DIAGNOSIS — R8299 Other abnormal findings in urine: Secondary | ICD-10-CM | POA: Diagnosis present

## 2017-02-10 DIAGNOSIS — F13239 Sedative, hypnotic or anxiolytic dependence with withdrawal, unspecified: Secondary | ICD-10-CM | POA: Diagnosis present

## 2017-02-10 DIAGNOSIS — T424X2A Poisoning by benzodiazepines, intentional self-harm, initial encounter: Secondary | ICD-10-CM | POA: Diagnosis present

## 2017-02-10 DIAGNOSIS — F1123 Opioid dependence with withdrawal: Secondary | ICD-10-CM | POA: Diagnosis present

## 2017-02-10 DIAGNOSIS — F17210 Nicotine dependence, cigarettes, uncomplicated: Secondary | ICD-10-CM | POA: Diagnosis present

## 2017-02-10 DIAGNOSIS — Z79899 Other long term (current) drug therapy: Secondary | ICD-10-CM

## 2017-02-10 DIAGNOSIS — T407X2A Poisoning by cannabis (derivatives), intentional self-harm, initial encounter: Secondary | ICD-10-CM | POA: Diagnosis present

## 2017-02-10 DIAGNOSIS — R001 Bradycardia, unspecified: Secondary | ICD-10-CM | POA: Diagnosis present

## 2017-02-10 DIAGNOSIS — T402X2A Poisoning by other opioids, intentional self-harm, initial encounter: Principal | ICD-10-CM | POA: Diagnosis present

## 2017-02-10 DIAGNOSIS — T50904A Poisoning by unspecified drugs, medicaments and biological substances, undetermined, initial encounter: Secondary | ICD-10-CM

## 2017-02-10 DIAGNOSIS — I4581 Long QT syndrome: Secondary | ICD-10-CM | POA: Diagnosis present

## 2017-02-10 DIAGNOSIS — F329 Major depressive disorder, single episode, unspecified: Secondary | ICD-10-CM | POA: Diagnosis present

## 2017-02-10 DIAGNOSIS — G9340 Encephalopathy, unspecified: Secondary | ICD-10-CM | POA: Diagnosis present

## 2017-02-10 LAB — CBC WITH DIFFERENTIAL/PLATELET
BASOS ABS: 0 10*3/uL (ref 0.0–0.1)
Basophils Relative: 0 %
Eosinophils Absolute: 0.1 10*3/uL (ref 0.0–0.7)
Eosinophils Relative: 1 %
HEMATOCRIT: 37.3 % (ref 36.0–46.0)
HEMOGLOBIN: 12.1 g/dL (ref 12.0–15.0)
LYMPHS ABS: 2 10*3/uL (ref 0.7–4.0)
Lymphocytes Relative: 41 %
MCH: 31.8 pg (ref 26.0–34.0)
MCHC: 32.4 g/dL (ref 30.0–36.0)
MCV: 97.9 fL (ref 78.0–100.0)
MONO ABS: 0.3 10*3/uL (ref 0.1–1.0)
Monocytes Relative: 7 %
NEUTROS PCT: 51 %
Neutro Abs: 2.6 10*3/uL (ref 1.7–7.7)
Platelets: 183 10*3/uL (ref 150–400)
RBC: 3.81 MIL/uL — AB (ref 3.87–5.11)
RDW: 13.4 % (ref 11.5–15.5)
WBC: 5 10*3/uL (ref 4.0–10.5)

## 2017-02-10 LAB — COMPREHENSIVE METABOLIC PANEL
ALBUMIN: 4 g/dL (ref 3.5–5.0)
ALK PHOS: 47 U/L (ref 38–126)
ALT: 12 U/L — AB (ref 14–54)
AST: 22 U/L (ref 15–41)
Anion gap: 7 (ref 5–15)
BILIRUBIN TOTAL: 0.5 mg/dL (ref 0.3–1.2)
BUN: 9 mg/dL (ref 6–20)
CALCIUM: 8.8 mg/dL — AB (ref 8.9–10.3)
CO2: 28 mmol/L (ref 22–32)
CREATININE: 0.59 mg/dL (ref 0.44–1.00)
Chloride: 105 mmol/L (ref 101–111)
GFR calc Af Amer: >60 mL/min (ref >60)
Glucose, Bld: 138 mg/dL — ABNORMAL HIGH (ref 65–99)
Potassium: 3.8 mmol/L (ref 3.5–5.1)
Sodium: 140 mmol/L (ref 135–145)
TOTAL PROTEIN: 6.9 g/dL (ref 6.5–8.1)

## 2017-02-10 LAB — CBG MONITORING, ED
GLUCOSE-CAPILLARY: 138 mg/dL — AB (ref 65–99)
GLUCOSE-CAPILLARY: 79 mg/dL (ref 65–99)
Glucose-Capillary: 66 mg/dL (ref 65–99)

## 2017-02-10 LAB — ACETAMINOPHEN LEVEL: Acetaminophen (Tylenol), Serum: <10 ug/mL — ABNORMAL LOW (ref 10–30)

## 2017-02-10 LAB — RAPID URINE DRUG SCREEN, HOSP PERFORMED
AMPHETAMINES: NOT DETECTED
BARBITURATES: NOT DETECTED
BENZODIAZEPINES: POSITIVE — AB
Cocaine: NOT DETECTED
Opiates: POSITIVE — AB
TETRAHYDROCANNABINOL: POSITIVE — AB

## 2017-02-10 LAB — SALICYLATE LEVEL: Salicylate Lvl: <7 mg/dL (ref 2.8–30.0)

## 2017-02-10 LAB — PHOSPHORUS: PHOSPHORUS: 4.1 mg/dL (ref 2.5–4.6)

## 2017-02-10 LAB — MAGNESIUM: MAGNESIUM: 2.1 mg/dL (ref 1.7–2.4)

## 2017-02-10 LAB — I-STAT BETA HCG BLOOD, ED (MC, WL, AP ONLY): I-stat hCG, quantitative: <5 m[IU]/mL (ref <5)

## 2017-02-10 LAB — ETHANOL

## 2017-02-10 IMAGING — CT CT HEAD W/O CM
3 of 4 series · 13 of 47 positions shown, 15 images · non-contrast
Comparison: None.

CLINICAL DATA: Patient found unconscious.

EXAM:
CT HEAD WITHOUT CONTRAST
TECHNIQUE: Contiguous axial images were obtained from the base of the skull
through the vertex without intravenous contrast.

[Series 3: head wo · axial · 0.44mm/px · z∈[-78,+42]mm · 7 of 33 slices shown, 9 images]
[im 5/33  brain]
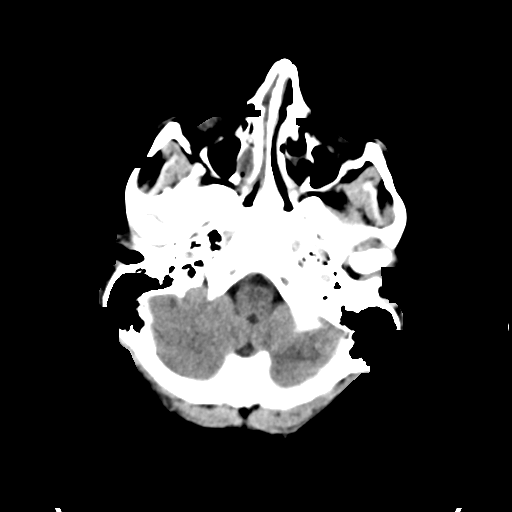
[im 5/33  bone]
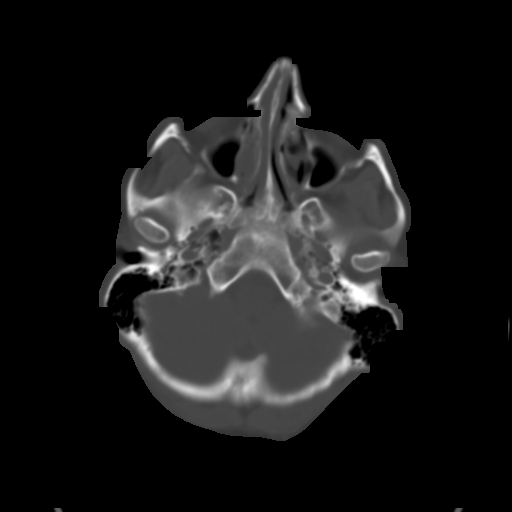
[im 9/33  brain]
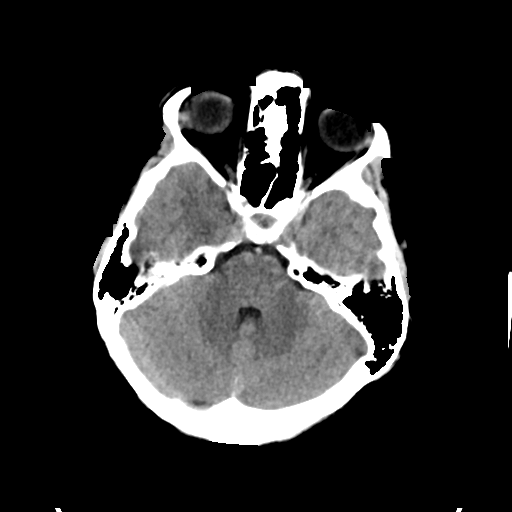
[im 13/33  brain]
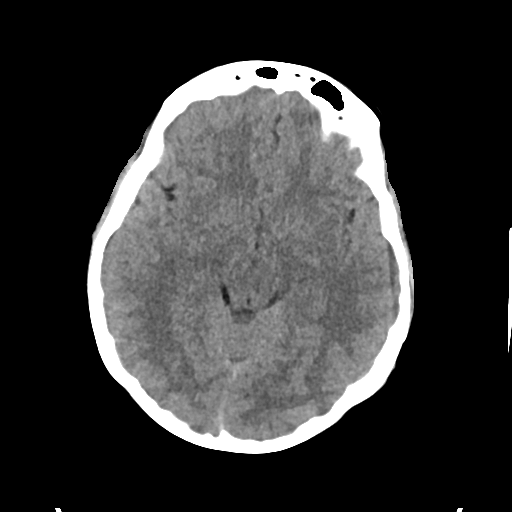
[im 17/33  brain]
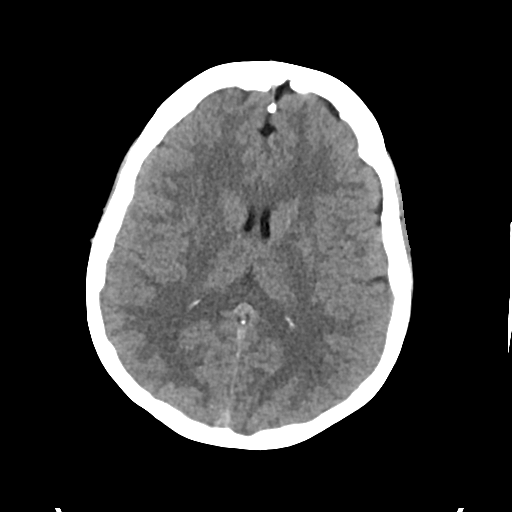
[im 21/33  brain]
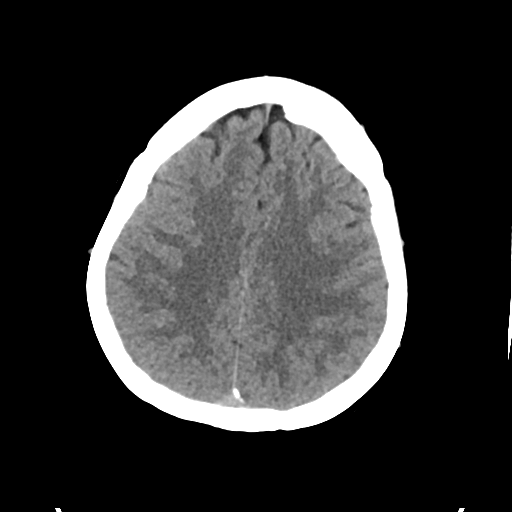
[im 21/33  bone]
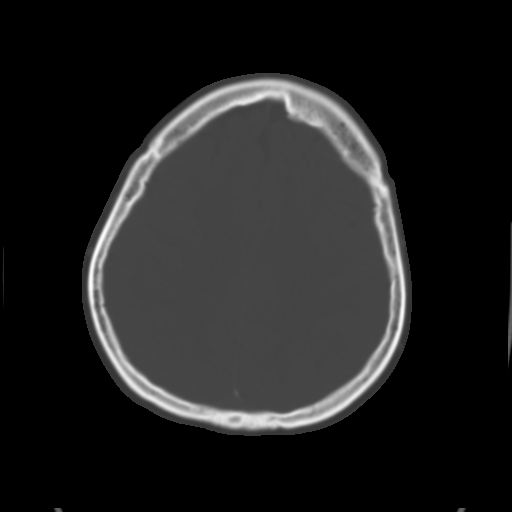
[im 25/33  brain]
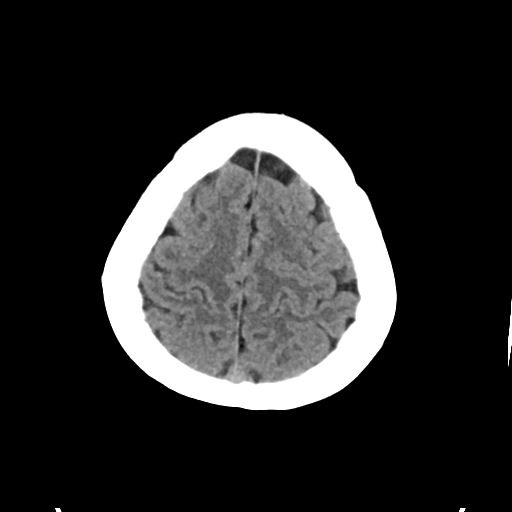
[im 29/33  brain]
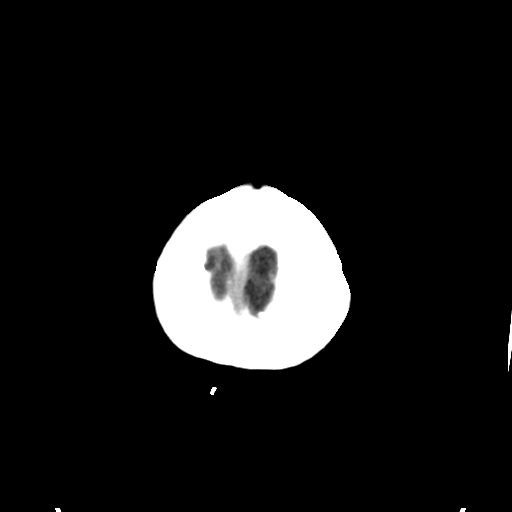

[Series 5: cor soft · coronal · 0.31mm/px · 3 of 74 slices shown]
[im 25/74  brain]
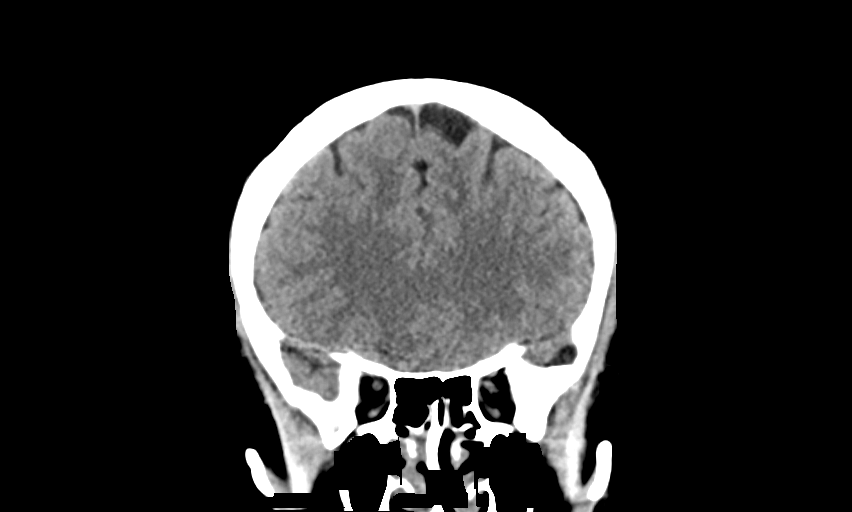
[im 33/74  brain]
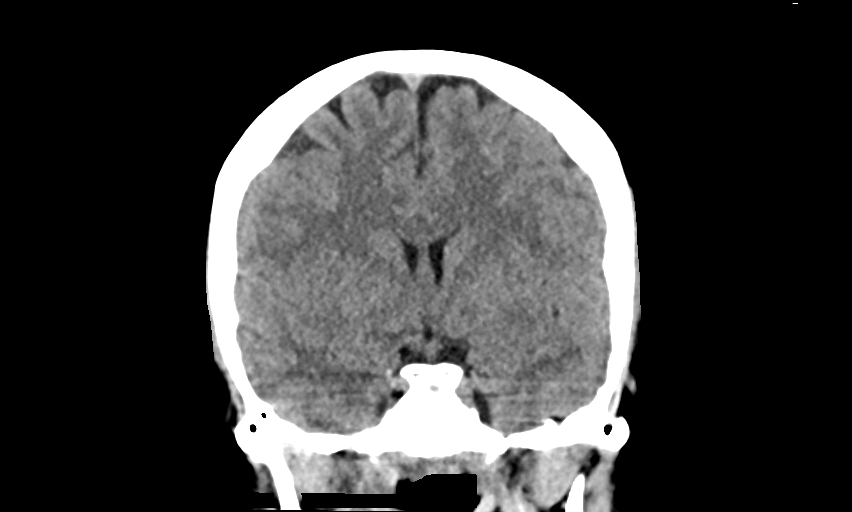
[im 41/74  brain]
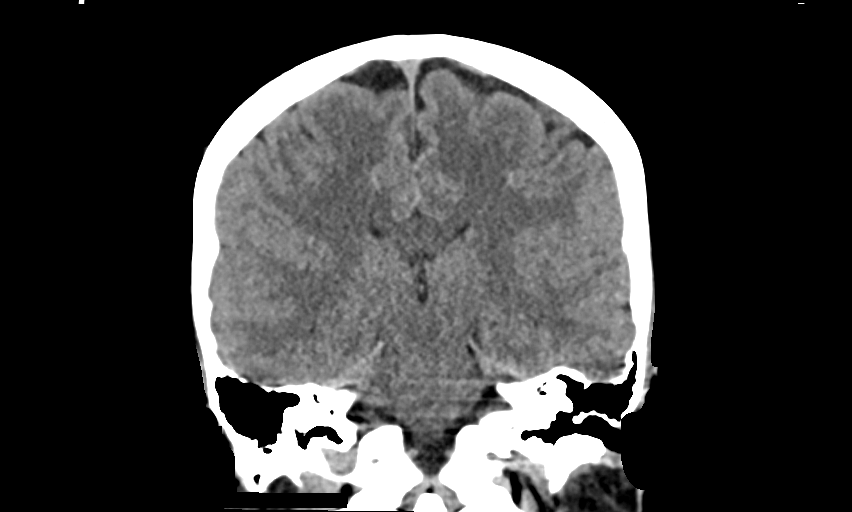

[Series 6: sag soft · sagittal · 0.31mm/px · 3 of 67 slices shown]
[im 23/67  brain]
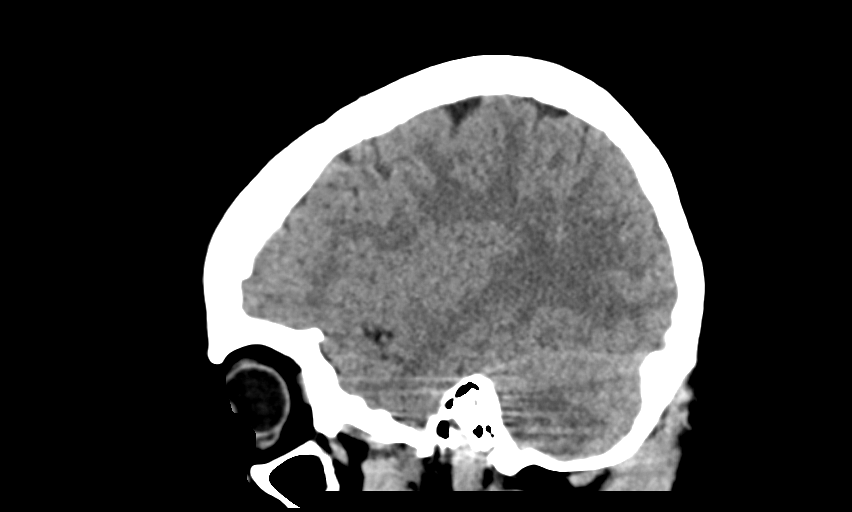
[im 34/67  brain]
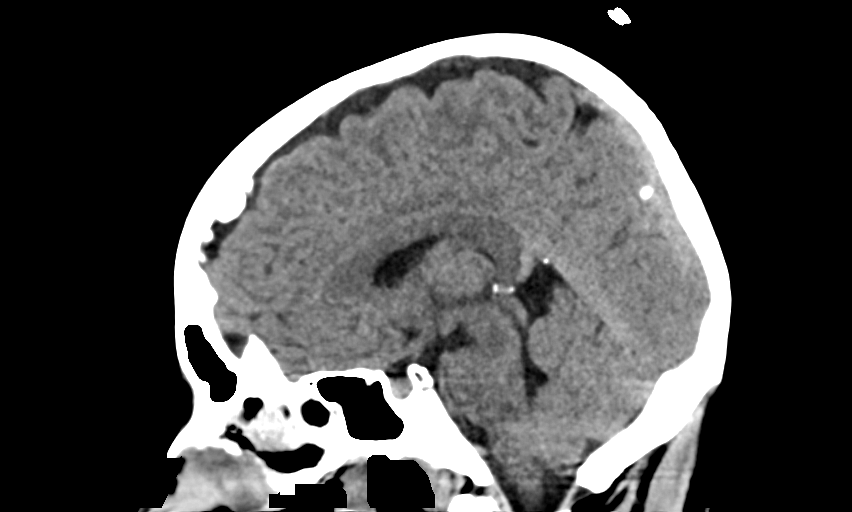
[im 45/67  brain]
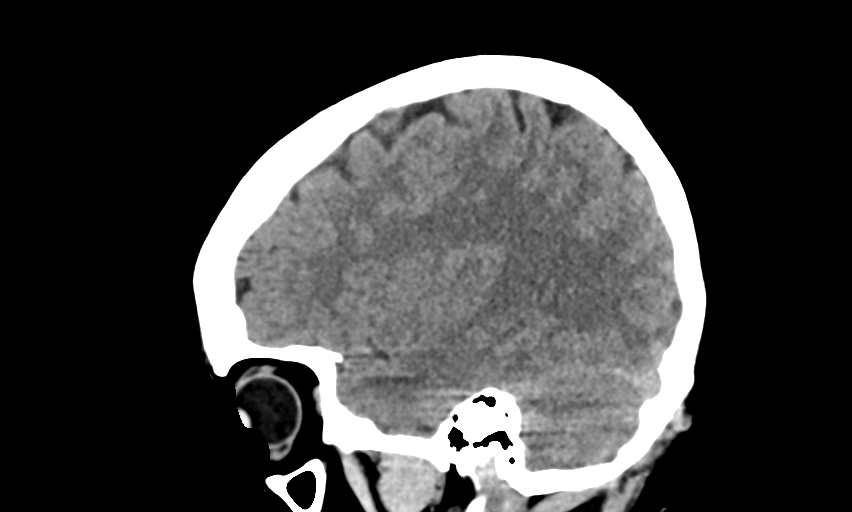

[13 of 47 positions shown; findings below may reference images not displayed]

FINDINGS: Brain: No evidence of acute infarction, hemorrhage, hydrocephalus,
extra-axial collection or mass lesion/mass effect.

Vascular: No hyperdense vessel or unexpected calcification.

Skull: Normal. Negative for fracture or focal lesion.

Sinuses/Orbits: There is debris in the left sphenoid sinus with
mucosal thickening. Paranasal sinuses, mastoid air cells, and middle
ears are otherwise normal.

Other: None.
IMPRESSION: 1. Mild sinus disease. No acute intracranial abnormality identified.

## 2017-02-10 MED ORDER — ONDANSETRON HCL 4 MG PO TABS
4.0000 mg | ORAL_TABLET | Freq: Four times a day (QID) | ORAL | Status: DC | PRN
  Administered 2017-02-11: 4 mg via ORAL
  Filled 2017-02-10: qty 1

## 2017-02-10 MED ORDER — ONDANSETRON HCL 4 MG/2ML IJ SOLN
4.0000 mg | Freq: Four times a day (QID) | INTRAMUSCULAR | Status: DC | PRN
  Administered 2017-02-14: 4 mg via INTRAVENOUS
  Filled 2017-02-10: qty 2

## 2017-02-10 MED ORDER — NALOXONE HCL 0.4 MG/ML IJ SOLN
0.2000 mg | Freq: Once | INTRAMUSCULAR | Status: AC
  Administered 2017-02-10: 0.2 mg via INTRAVENOUS
  Filled 2017-02-10: qty 1

## 2017-02-10 MED ORDER — ACETAMINOPHEN 650 MG RE SUPP: 650.0000 mg | Freq: Four times a day (QID) | RECTAL | Status: DC | PRN

## 2017-02-10 MED ORDER — ACETAMINOPHEN 325 MG PO TABS
650.0000 mg | ORAL_TABLET | Freq: Four times a day (QID) | ORAL | Status: DC | PRN
  Administered 2017-02-12: 650 mg via ORAL
  Filled 2017-02-10: qty 2

## 2017-02-10 MED ORDER — NALOXONE HCL 0.4 MG/ML IJ SOLN
0.4000 mg | Freq: Once | INTRAMUSCULAR | Status: AC
  Administered 2017-02-10: 0.4 mg via INTRAVENOUS
  Filled 2017-02-10: qty 1

## 2017-02-10 MED ORDER — KCL IN DEXTROSE-NACL 20-5-0.45 MEQ/L-%-% IV SOLN
INTRAVENOUS | Status: DC
  Administered 2017-02-10 – 2017-02-12 (×4): via INTRAVENOUS
  Filled 2017-02-10 (×5): qty 1000

## 2017-02-10 MED ORDER — NALOXONE HCL 0.4 MG/ML IJ SOLN
0.4000 mg | INTRAMUSCULAR | Status: DC | PRN
Start: 2017-02-10 — End: 2017-02-14

## 2017-02-10 MED ORDER — NALOXONE HCL 2 MG/2ML IJ SOSY: 2.0000 mg | PREFILLED_SYRINGE | Freq: Once | INTRAMUSCULAR | Status: DC

## 2017-02-10 NOTE — Progress Notes (Signed)
Received report on pt.

## 2017-02-10 NOTE — ED Notes (Signed)
Pt rousses to voice and touch and denies pain. Minimal response but increased from last response.

## 2017-02-10 NOTE — H&P (Signed)
[facility] History and Physical  AKSHITHA CULMER ZOX:[id number] DOB: 1990-09-19 DOA: 02/10/2017  Referring physician:  PCP: Patient, No Pcp Per   Chief Complaint: Altered metal status  HPI: Sharon [name] is a 26 y.o. female  past medical history of headaches, carpal tunnel, arthritis, anxiety and substance abuse presents emergency room with decreased level of consciousness. Patient was found by police in her car with the number of pills at her side. She was difficult to arouse. EMS activated. Patient to the emergency room very drowsy. Maintaining her O2 saturation. UDS was positive for illegal substances.  ED course: Patient was in the emergency room for over 10 hours without significant increase in her level consciousness. Hospitalist consulted for admission.   Review of Systems:  As per HPI otherwise 10 point review of systems negative.    Past Medical History:  Diagnosis Date  . Anxiety    no meds  . Arthritis    right lower arm tendonitis  . Carpal tunnel syndrome   . Headache(784.0)    otc med prn   Past Surgical History:  Procedure Laterality Date  . CESAREAN SECTION  05/16/2012   Procedure: CESAREAN SECTION;  Surgeon: Brock Bad, MD;  Location: [facility];  Service: Obstetrics;  Laterality: N/A;  Primary  . FOOT SURGERY  1997   removal foreign body (sewing needle)  . TOOTH EXTRACTION  2008   upper incisor x2   Social History:  reports that she has been smoking Cigarettes.  She has a 1.00 pack-year smoking history. She has quit using smokeless tobacco. She reports that she uses drugs, including Marijuana, Other-see comments, and Cocaine. She reports that she does not drink alcohol.  No Known Allergies  Family History  Problem Relation Age of Onset  . Seizures Father   . Diabetes Other   . CAD Other   . Hypertension Other   . Anesthesia problems Neg Hx   . Hypotension Neg Hx   . Malignant hyperthermia Neg Hx   . Pseudochol deficiency Neg Hx       Prior to Admission medications   Medication Sig Start Date End Date Taking? Authorizing Provider  ibuprofen (ADVIL,MOTRIN) 800 MG tablet Take 1 tablet (800 mg total) by mouth every 8 (eight) hours as needed. Patient taking differently: Take 800 mg by mouth every 8 (eight) hours as needed (pain).  01/23/17   Brock Bad, MD  Norethindrone Acetate-Ethinyl Estrad-FE (LOESTRIN 24 FE) 1-20 MG-MCG(24) tablet Take 1 tablet by mouth daily. Start pills on 1st day of period. 01/23/17   Brock Bad, MD  varenicline (CHANTIX CONTINUING MONTH PAK) 1 MG tablet Take 1 tablet (1 mg total) by mouth 2 (two) times daily. 01/23/17   Brock Bad, MD  varenicline (CHANTIX STARTING MONTH PAK) 0.5 MG X 11 & 1 MG X 42 tablet Take one 0.5 mg tablet by mouth once daily for 3 days, then increase to one 0.5 mg tablet twice daily for 4 days, then increase to one 1 mg tablet twice daily. 01/23/17   Brock Bad, MD   Physical Exam: Vitals:   02/10/17 1430 02/10/17 1445 02/10/17 1500 02/10/17 1543  BP: (!) 98/54 91/62 90/63    Pulse: 80 90 86   Resp: 12 12 16    Temp:    98.3 F (36.8 C)  TempSrc:    Oral  SpO2: 99% 100% 100%   Weight:      Height:        Wt Readings  from Last 3 Encounters:  02/10/17 69.9 kg (154 lb)  01/23/17 69.9 kg (154 lb)  08/09/13 73.5 kg (162 lb)    General:  Appears calm and comfortable; GCS 14 Eyes:  PERRL, EOMI, normal lids, iris ENT:  grossly normal hearing, lips & tongue Neck:  no LAD, masses or thyromegaly Cardiovascular:  RRR, no m/r/g. No LE edema.  Respiratory:  CTA bilaterally, no w/r/r. Normal respiratory effort. Abdomen:  soft, ntnd Skin:  no rash or induration seen on limited exam Musculoskeletal:  grossly normal tone BUE/BLE Psychiatric:  grossly normal mood and affect, speech fluent and appropriate Neurologic:  CN 2-12 grossly intact, moves all extremities in coordinated fashion.          Labs on Admission:  Basic Metabolic Panel:  Recent  Labs Lab 02/10/17 0213  NA 140  K 3.8  CL 105  CO2 28  GLUCOSE 138*  BUN 9  CREATININE 0.59  CALCIUM 8.8*   Liver Function Tests:  Recent Labs Lab 02/10/17 0213  AST 22  ALT 12*  ALKPHOS 47  BILITOT 0.5  PROT 6.9  ALBUMIN 4.0   No results for input(s): LIPASE, AMYLASE in the last 168 hours. No results for input(s): AMMONIA in the last 168 hours. CBC:  Recent Labs Lab 02/10/17 0213  WBC 5.0  NEUTROABS 2.6  HGB 12.1  HCT 37.3  MCV 97.9  PLT 183   Cardiac Enzymes: No results for input(s): CKTOTAL, CKMB, CKMBINDEX, TROPONINI in the last 168 hours.  BNP (last 3 results) No results for input(s): BNP in the last 8760 hours.  ProBNP (last 3 results) No results for input(s): PROBNP in the last 8760 hours.   Serum creatinine: 0.59 mg/dL 40/37/09 6438 Estimated creatinine clearance: 104.6 mL/min  CBG:  Recent Labs Lab 02/10/17 0211 02/10/17 1341 02/10/17 1555  GLUCAP 138* 66 79    Radiological Exams on Admission: Ct Head Wo Contrast  Result Date: 02/10/2017 CLINICAL DATA:  Patient found unconscious. EXAM: CT HEAD WITHOUT CONTRAST TECHNIQUE: Contiguous axial images were obtained from the base of the skull through the vertex without intravenous contrast. COMPARISON:  None. FINDINGS: Brain: No evidence of acute infarction, hemorrhage, hydrocephalus, extra-axial collection or mass lesion/mass effect. Vascular: No hyperdense vessel or unexpected calcification. Skull: Normal. Negative for fracture or focal lesion. Sinuses/Orbits: There is debris in the left sphenoid sinus with mucosal thickening. Paranasal sinuses, mastoid air cells, and middle ears are otherwise normal. Other: None. IMPRESSION: 1. Mild sinus disease. No acute intracranial abnormality identified. Electronically Signed   By: Gerome Sam III M.D   On: 02/10/2017 16:37    EKG: Independently reviewed. Prolonged QT, SR, tachy.  Assessment/Plan Active Problems:   Acute encephalopathy  Acute  encephalopathy Due to ingestion of drugs in a parking lot Unknown if patient was trying to commit suicide Sitter in room UDS pos for multiple substances Narcan when necessary GCS 14 No family available Suicide precautions Check LFTS in AM Check UA CT head neg Cont pulse ox  Long QT Check lytes Likely drug related EKG in AM  Code Status: FC  DVT Prophylaxis: SCDs Family Communication: none Disposition Plan: Pending Improvement  Status: tele inpt  Haydee Salter, MD Family Medicine [facility] [email or web address] Password [email or web address]

## 2017-02-10 NOTE — ED Notes (Signed)
Pt responding to painful stimuli only until the non-rebreather was placed on her face.  She attempted to pull off the mask  For a few seconds then  Went back to sleep  She mumbled a few words than went back to sleep

## 2017-02-10 NOTE — ED Notes (Signed)
Checked patient blood sugar it was 66 notified RN of blood sugar

## 2017-02-10 NOTE — ED Notes (Signed)
The pt  Wakes up then goes right back to sleep

## 2017-02-10 NOTE — ED Notes (Signed)
Pt still only alert only to speech; pt given 8 oz orange juice for CBG of 66. Pt tolerated, but needed to be waken up frequently by RN. Pt speech incomprehensible, not ambulating pt at this time EDP made aware.

## 2017-02-10 NOTE — ED Provider Notes (Signed)
Patient care transferred to me. Patient has slowly improved, but it is quite slowly and she is still confused. I still believe this is drug related. No vomiting. Arouses more and more easily but still quickly falls back asleep and is disoriented. No head trauma. No fevers. Given a dose of narcan (seemed to work with last EDP) and she woke up more although was still confused. I think  Narcotics are partially related. However she is protecting her airway so no more at this time. Will continue to observe, and given amount of time will admit.    Pricilla Loveless, MD 02/10/17 [phone]

## 2017-02-10 NOTE — ED Provider Notes (Signed)
[facility]-EMERGENCY DEPT Provider Note   CSN: [id number] Arrival date & time: 02/10/17  0201  By signing my name below, I, Ny'Kea Lewis, attest that this documentation has been prepared under the direction and in the presence of Melene Plan, DO. Electronically Signed: Karren Cobble, ED Scribe. 02/10/17. 2:16 AM.  History   Chief Complaint Chief Complaint  Patient presents with  . Altered Mental Status   The history is provided by the EMS personnel. The history is limited by the condition of the patient. No language interpreter was used.    HPI Comments: Sharon [name] is a 26 y.o. female with a PMHx of anxiety, brought in by ambulance, who presents to the Emergency Department with an altered mental status. Per EMS pt was found in her car in the parking lot of a gas station unresponisve. She was found with marijuana and various pills in her possession. Initially she was responsive to verbal stimuli, then she became faintly responsive to painful stimuli. EMS vitals were as followed BP 130/100, HR 100, CBG 124, RR 20, and SpO2 100%.   Past Medical History:  Diagnosis Date  . Anxiety    no meds  . Arthritis    right lower arm tendonitis  . Carpal tunnel syndrome   . Headache(784.0)    otc med prn   Patient Active Problem List   Diagnosis Date Noted  . Acute encephalopathy 02/10/2017  . Previous cesarean delivery, delivered, with or without mention of antepartum condition 05/17/2012   Past Surgical History:  Procedure Laterality Date  . CESAREAN SECTION  05/16/2012   Procedure: CESAREAN SECTION;  Surgeon: Brock Bad, MD;  Location: [facility];  Service: Obstetrics;  Laterality: N/A;  Primary  . FOOT SURGERY  1997   removal foreign body (sewing needle)  . TOOTH EXTRACTION  2008   upper incisor x2    OB History    Gravida Para Term Preterm AB Living   1 1 1     1    SAB TAB Ectopic Multiple Live Births           1      Obstetric Comments   1 st pregnancy , unplanned      Home Medications    Prior to Admission medications   Medication Sig Start Date End Date Taking? Authorizing Provider  ibuprofen (ADVIL,MOTRIN) 800 MG tablet Take 1 tablet (800 mg total) by mouth every 8 (eight) hours as needed. Patient taking differently: Take 800 mg by mouth every 8 (eight) hours as needed (pain).  01/23/17   Brock Bad, MD  Norethindrone Acetate-Ethinyl Estrad-FE (LOESTRIN 24 FE) 1-20 MG-MCG(24) tablet Take 1 tablet by mouth daily. Start pills on 1st day of period. 01/23/17   Brock Bad, MD  varenicline (CHANTIX CONTINUING MONTH PAK) 1 MG tablet Take 1 tablet (1 mg total) by mouth 2 (two) times daily. 01/23/17   Brock Bad, MD  varenicline (CHANTIX STARTING MONTH PAK) 0.5 MG X 11 & 1 MG X 42 tablet Take one 0.5 mg tablet by mouth once daily for 3 days, then increase to one 0.5 mg tablet twice daily for 4 days, then increase to one 1 mg tablet twice daily. 01/23/17   Brock Bad, MD   Family History Family History  Problem Relation Age of Onset  . Seizures Father   . Diabetes Other   . CAD Other   . Hypertension Other   . Anesthesia problems Neg Hx   . Hypotension Neg  Hx   . Malignant hyperthermia Neg Hx   . Pseudochol deficiency Neg Hx    Social History Social History  Substance Use Topics  . Smoking status: Current Every Day Smoker    Packs/day: 0.25    Years: 4.00    Types: Cigarettes  . Smokeless tobacco: Former Neurosurgeon  . Alcohol use No     Comment: ocassionaly    Allergies   Patient has no known allergies.  Review of Systems Review of Systems  Unable to perform ROS: Mental status change   Physical Exam Updated Vital Signs BP (!) 110/50 (BP Location: Right Arm)   Pulse 68   Temp 97.6 F (36.4 C) (Oral)   Resp 16   Ht 5\' 5"  (1.651 m)   Wt 70.8 kg (156 lb 1.6 oz)   LMP 01/15/2017 (Exact Date)   SpO2 100%   BMI 25.98 kg/m   Physical Exam  Constitutional: She appears well-developed and well-nourished. No distress.  One  episode of hallucination noted. Moisture noted bilaterally to the axilla.  HENT:  Head: Normocephalic and atraumatic.  Eyes: Pupils are equal, round, and reactive to light. EOM are normal.  20mm.   Neck: Normal range of motion. Neck supple.  Cardiovascular: Regular rhythm and normal heart sounds.  Tachycardia present.  Exam reveals no gallop and no friction rub.   No murmur heard. Pulmonary/Chest: Effort normal and breath sounds normal. She has no wheezes. She has no rales.  Abdominal: Soft. She exhibits no distension. There is no tenderness.  Musculoskeletal: She exhibits no edema or tenderness.  Neurological: GCS eye subscore is 4. GCS verbal subscore is 2. GCS motor subscore is 5.  Skin: Skin is warm and dry. She is not diaphoretic.  Psychiatric: She has a normal mood and affect. Her behavior is normal.  Nursing note and vitals reviewed.  ED Treatments / Results  DIAGNOSTIC STUDIES: Oxygen Saturation is 94% on RA, low by my interpretation.   COORDINATION OF CARE: 11:00 PM-Discussed next steps with pt. Pt verbalized understanding and is agreeable with the plan.   Labs (all labs ordered are listed, but only abnormal results are displayed) Labs Reviewed  CBC WITH DIFFERENTIAL/PLATELET - Abnormal; Notable for the following:       Result Value   RBC 3.81 (*)    All other components within normal limits  COMPREHENSIVE METABOLIC PANEL - Abnormal; Notable for the following:    Glucose, Bld 138 (*)    Calcium 8.8 (*)    ALT 12 (*)    All other components within normal limits  RAPID URINE DRUG SCREEN, HOSP PERFORMED - Abnormal; Notable for the following:    Opiates POSITIVE (*)    Benzodiazepines POSITIVE (*)    Tetrahydrocannabinol POSITIVE (*)    All other components within normal limits  ACETAMINOPHEN LEVEL - Abnormal; Notable for the following:    Acetaminophen (Tylenol), Serum <10 (*)    All other components within normal limits  CBG MONITORING, ED - Abnormal; Notable for  the following:    Glucose-Capillary 138 (*)    All other components within normal limits  ETHANOL  SALICYLATE LEVEL  MAGNESIUM  PHOSPHORUS  BASIC METABOLIC PANEL  CBC  HIV ANTIBODY (ROUTINE TESTING)  HEPATIC FUNCTION PANEL  URINALYSIS, ROUTINE W REFLEX MICROSCOPIC  I-STAT BETA HCG BLOOD, ED ([facility], [facility], [facility] ONLY)  CBG MONITORING, ED  CBG MONITORING, ED    EKG  EKG Interpretation  Date/Time:  Saturday February 10 2017 02:07:22 EDT Ventricular Rate:  103  PR Interval:    QRS Duration: 84 QT Interval:  369 QTC Calculation: 483 R Axis:   66 Text Interpretation:  Sinus tachycardia Borderline prolonged QT interval No old tracing to compare Confirmed by Melene Plan [phone]) on 02/10/2017 2:12:15 AM Also confirmed by Melene Plan [phone]), editor Misty Stanley [phone])  on 02/10/2017 8:33:57 AM       Radiology Ct Head Wo Contrast  Result Date: 02/10/2017 CLINICAL DATA:  Patient found unconscious. EXAM: CT HEAD WITHOUT CONTRAST TECHNIQUE: Contiguous axial images were obtained from the base of the skull through the vertex without intravenous contrast. COMPARISON:  None. FINDINGS: Brain: No evidence of acute infarction, hemorrhage, hydrocephalus, extra-axial collection or mass lesion/mass effect. Vascular: No hyperdense vessel or unexpected calcification. Skull: Normal. Negative for fracture or focal lesion. Sinuses/Orbits: There is debris in the left sphenoid sinus with mucosal thickening. Paranasal sinuses, mastoid air cells, and middle ears are otherwise normal. Other: None. IMPRESSION: 1. Mild sinus disease. No acute intracranial abnormality identified. Electronically Signed   By: Gerome Sam III M.D   On: 02/10/2017 16:37    Procedures Procedures (including critical care time)  Medications Ordered in ED Medications  dextrose 5 % and 0.45 % NaCl with KCl 20 mEq/L infusion ( Intravenous New Bag/Given 02/10/17 1808)  naloxone [facility]) injection 0.4 mg (not administered)  acetaminophen  (TYLENOL) tablet 650 mg (not administered)    Or  acetaminophen (TYLENOL) suppository 650 mg (not administered)  ondansetron (ZOFRAN) tablet 4 mg (not administered)    Or  ondansetron (ZOFRAN) injection 4 mg (not administered)  naloxone (NARCAN) injection 0.2 mg (0.2 mg Intravenous Given 02/10/17 0607)  naloxone [facility]) injection 0.4 mg (0.4 mg Intravenous Given 02/10/17 1459)     Initial Impression / Assessment and Plan / ED Course  I have reviewed the triage vital signs and the nursing notes.  Pertinent labs & imaging results that were available during my care of the patient were reviewed by me and considered in my medical decision making (see chart for details).     26 yo F with a chief complaint of a suspected overdose. The patient was found unresponsive in her car. With her there are multiple different medications. History to me sounds most consistent with ketamine ingestion. She had some hallucinations while I was in the room. No noted nystagmus though there was some noted on scene. I will obtain labs to evaluate for congestion. She has no signs of trauma I do not feel that imaging is warranted. Obs in the ED until sober.  Patient with decreased respiratory rate. Given narcan with improvement though still is clinically intoxicated.  Will continue to obs. Discussed with Dr. Criss Alvine, please see his note for further details.   The patients results and plan were reviewed and discussed.   Any x-rays performed were independently reviewed by myself.   Differential diagnosis were considered with the presenting HPI.  Medications  dextrose 5 % and 0.45 % NaCl with KCl 20 mEq/L infusion ( Intravenous New Bag/Given 02/10/17 1808)  naloxone [facility]) injection 0.4 mg (not administered)  acetaminophen (TYLENOL) tablet 650 mg (not administered)    Or  acetaminophen (TYLENOL) suppository 650 mg (not administered)  ondansetron (ZOFRAN) tablet 4 mg (not administered)    Or  ondansetron (ZOFRAN)  injection 4 mg (not administered)  naloxone [facility]) injection 0.2 mg (0.2 mg Intravenous Given 02/10/17 0607)  naloxone [facility]) injection 0.4 mg (0.4 mg Intravenous Given 02/10/17 1459)    Vitals:   02/10/17 1445 02/10/17 1500  02/10/17 1543 02/10/17 2047  BP: 91/62 90/63  (!) 110/50  Pulse: 90 86  68  Resp: 12 16  16   Temp:   98.3 F (36.8 C) 97.6 F (36.4 C)  TempSrc:   Oral Oral  SpO2: 100% 100%  100%  Weight:    70.8 kg (156 lb 1.6 oz)  Height:        Final diagnoses:  Drug overdose, undetermined intent, initial encounter       Final Clinical Impressions(s) / ED Diagnoses   Final diagnoses:  Drug overdose, undetermined intent, initial encounter    New Prescriptions Current Discharge Medication List    I personally performed the services described in this documentation, which was scribed in my presence. The recorded information has been reviewed and is accurate.      Melene Plan, DO 02/10/17 2300

## 2017-02-10 NOTE — ED Notes (Signed)
Pt given orange juice for CBG 79.

## 2017-02-10 NOTE — Progress Notes (Signed)
Sharon [name] is a 26 y.o. female patient admitted from ED awake, alert - oriented  X 4 - no acute distress noted.  VSS - Blood pressure 90/63, pulse 86, temperature 98.3 F (36.8 C), temperature source Oral, resp. rate 16, height 5\' 5"  (1.651 m), weight 69.9 kg (154 lb), last menstrual period 01/15/2017, SpO2 100 %.    IV in place, occlusive dsg intact without redness.  Pt only responds to painful stimuli. Will orient to unit as orientation allows. SR up x 2, fall assessment complete. Call light within reach. Skin, clean-dry- intact without evidence of bruising, or skin tears.   No evidence of skin break down noted on exam. Suicide sitter at bedside.   Will cont to eval and treat per MD orders.  Theodosia Blender, RN 02/10/2017 4:55 PM

## 2017-02-10 NOTE — ED Notes (Signed)
Pt sitting with hob elevated. Responds to voice. States she is in "[place]" in a "hospital." Pt has single word responses. With very soft voice. Immediately returns to sleep without stimuli.

## 2017-02-10 NOTE — ED Notes (Addendum)
Pt crying and and arching back and pulling arms close to body; oriented to person, time disoriented to place and situation. Pt states she feels like something is in her ear. EDP made aware.

## 2017-02-10 NOTE — ED Notes (Signed)
Attempted to call report

## 2017-02-10 NOTE — ED Notes (Signed)
Patient transported to CT 

## 2017-02-10 NOTE — ED Notes (Addendum)
Pt temp 98.3 does not meet parameters for warming blanket. Pt urinated on self and bed; linens and gown changed and given warm blankets.

## 2017-02-10 NOTE — ED Triage Notes (Signed)
The pt was brought in by gems from her car that was parked at a service station.    She had cocaine  And pot with numerus unmarked pills beside her.  cbg 124  Iv per ems

## 2017-02-11 ENCOUNTER — Encounter (HOSPITAL_COMMUNITY): Payer: Self-pay

## 2017-02-11 DIAGNOSIS — T424X4A Poisoning by benzodiazepines, undetermined, initial encounter: Secondary | ICD-10-CM

## 2017-02-11 DIAGNOSIS — T391X4A Poisoning by 4-Aminophenol derivatives, undetermined, initial encounter: Secondary | ICD-10-CM

## 2017-02-11 DIAGNOSIS — F1490 Cocaine use, unspecified, uncomplicated: Secondary | ICD-10-CM

## 2017-02-11 DIAGNOSIS — F11222 Opioid dependence with intoxication with perceptual disturbance: Secondary | ICD-10-CM

## 2017-02-11 DIAGNOSIS — F332 Major depressive disorder, recurrent severe without psychotic features: Secondary | ICD-10-CM

## 2017-02-11 DIAGNOSIS — F17210 Nicotine dependence, cigarettes, uncomplicated: Secondary | ICD-10-CM

## 2017-02-11 DIAGNOSIS — F1290 Cannabis use, unspecified, uncomplicated: Secondary | ICD-10-CM

## 2017-02-11 LAB — CBC
HEMATOCRIT: 37 % (ref 36.0–46.0)
Hemoglobin: 11.8 g/dL — ABNORMAL LOW (ref 12.0–15.0)
MCH: 31 pg (ref 26.0–34.0)
MCHC: 31.9 g/dL (ref 30.0–36.0)
MCV: 97.1 fL (ref 78.0–100.0)
Platelets: 181 10*3/uL (ref 150–400)
RBC: 3.81 MIL/uL — ABNORMAL LOW (ref 3.87–5.11)
RDW: 13.4 % (ref 11.5–15.5)
WBC: 6.4 10*3/uL (ref 4.0–10.5)

## 2017-02-11 LAB — HEPATIC FUNCTION PANEL
ALBUMIN: 3.3 g/dL — AB (ref 3.5–5.0)
ALT: 13 U/L — AB (ref 14–54)
AST: 16 U/L (ref 15–41)
Alkaline Phosphatase: 45 U/L (ref 38–126)
BILIRUBIN DIRECT: 0.1 mg/dL (ref 0.1–0.5)
BILIRUBIN TOTAL: 1.1 mg/dL (ref 0.3–1.2)
Indirect Bilirubin: 1 mg/dL — ABNORMAL HIGH (ref 0.3–0.9)
Total Protein: 6.3 g/dL — ABNORMAL LOW (ref 6.5–8.1)

## 2017-02-11 LAB — URINALYSIS, ROUTINE W REFLEX MICROSCOPIC
BILIRUBIN URINE: NEGATIVE
GLUCOSE, UA: NEGATIVE mg/dL
KETONES UR: NEGATIVE mg/dL
NITRITE: POSITIVE — AB
PH: 5 (ref 5.0–8.0)
PROTEIN: 30 mg/dL — AB
Specific Gravity, Urine: 1.026 (ref 1.005–1.030)

## 2017-02-11 LAB — BASIC METABOLIC PANEL
Anion gap: 2 — ABNORMAL LOW (ref 5–15)
BUN: 9 mg/dL (ref 6–20)
CO2: 32 mmol/L (ref 22–32)
CREATININE: 0.57 mg/dL (ref 0.44–1.00)
Calcium: 8.7 mg/dL — ABNORMAL LOW (ref 8.9–10.3)
Chloride: 109 mmol/L (ref 101–111)
Glucose, Bld: 92 mg/dL (ref 65–99)
POTASSIUM: 3.6 mmol/L (ref 3.5–5.1)
SODIUM: 143 mmol/L (ref 135–145)

## 2017-02-11 MED ORDER — CHLORDIAZEPOXIDE HCL 25 MG PO CAPS
25.0000 mg | ORAL_CAPSULE | Freq: Four times a day (QID) | ORAL | Status: AC
  Administered 2017-02-11 – 2017-02-12 (×6): 25 mg via ORAL
  Filled 2017-02-11 (×6): qty 1

## 2017-02-11 MED ORDER — HYDROXYZINE HCL 25 MG PO TABS: 25.0000 mg | ORAL_TABLET | Freq: Four times a day (QID) | ORAL | Status: DC | PRN

## 2017-02-11 MED ORDER — DICYCLOMINE HCL 20 MG PO TABS: 20.0000 mg | ORAL_TABLET | Freq: Four times a day (QID) | ORAL | Status: DC | PRN

## 2017-02-11 MED ORDER — CLONIDINE HCL 0.1 MG PO TABS: 0.1000 mg | ORAL_TABLET | ORAL | Status: DC

## 2017-02-11 MED ORDER — CHLORDIAZEPOXIDE HCL 25 MG PO CAPS: 25.0000 mg | ORAL_CAPSULE | Freq: Four times a day (QID) | ORAL | Status: DC | PRN

## 2017-02-11 MED ORDER — NICOTINE 21 MG/24HR TD PT24
21.0000 mg | MEDICATED_PATCH | Freq: Every day | TRANSDERMAL | Status: DC
  Administered 2017-02-11 – 2017-02-14 (×4): 21 mg via TRANSDERMAL
  Filled 2017-02-11 (×4): qty 1

## 2017-02-11 MED ORDER — CLONIDINE HCL 0.1 MG PO TABS
0.1000 mg | ORAL_TABLET | Freq: Four times a day (QID) | ORAL | Status: DC
  Administered 2017-02-11 – 2017-02-12 (×7): 0.1 mg via ORAL
  Filled 2017-02-11 (×8): qty 1

## 2017-02-11 MED ORDER — CHLORDIAZEPOXIDE HCL 25 MG PO CAPS: 25.0000 mg | ORAL_CAPSULE | Freq: Every day | ORAL | Status: DC

## 2017-02-11 MED ORDER — CHLORDIAZEPOXIDE HCL 25 MG PO CAPS
25.0000 mg | ORAL_CAPSULE | Freq: Three times a day (TID) | ORAL | Status: AC
  Administered 2017-02-12 – 2017-02-13 (×3): 25 mg via ORAL
  Filled 2017-02-11 (×3): qty 1

## 2017-02-11 MED ORDER — LOPERAMIDE HCL 2 MG PO CAPS
2.0000 mg | ORAL_CAPSULE | ORAL | Status: DC | PRN
  Administered 2017-02-14: 4 mg via ORAL
  Filled 2017-02-11: qty 2

## 2017-02-11 MED ORDER — ADULT MULTIVITAMIN W/MINERALS CH
1.0000 | ORAL_TABLET | Freq: Every day | ORAL | Status: DC
  Administered 2017-02-11 – 2017-02-14 (×4): 1 via ORAL
  Filled 2017-02-11 (×4): qty 1

## 2017-02-11 MED ORDER — IBUPROFEN 400 MG PO TABS
400.0000 mg | ORAL_TABLET | Freq: Four times a day (QID) | ORAL | Status: DC | PRN
  Administered 2017-02-11: 400 mg via ORAL
  Filled 2017-02-11: qty 1

## 2017-02-11 MED ORDER — ONDANSETRON 4 MG PO TBDP: 4.0000 mg | ORAL_TABLET | Freq: Four times a day (QID) | ORAL | Status: DC | PRN

## 2017-02-11 MED ORDER — CHLORDIAZEPOXIDE HCL 25 MG PO CAPS
25.0000 mg | ORAL_CAPSULE | ORAL | Status: AC
  Administered 2017-02-13 – 2017-02-14 (×2): 25 mg via ORAL
  Filled 2017-02-11 (×2): qty 1

## 2017-02-11 MED ORDER — CLONIDINE HCL 0.1 MG PO TABS: 0.1000 mg | ORAL_TABLET | Freq: Every day | ORAL | Status: DC

## 2017-02-11 MED ORDER — METHOCARBAMOL 500 MG PO TABS
500.0000 mg | ORAL_TABLET | Freq: Three times a day (TID) | ORAL | Status: DC | PRN
  Administered 2017-02-11 – 2017-02-13 (×6): 500 mg via ORAL
  Filled 2017-02-11 (×7): qty 1

## 2017-02-11 MED ORDER — NAPROXEN 250 MG PO TABS
500.0000 mg | ORAL_TABLET | Freq: Two times a day (BID) | ORAL | Status: DC | PRN
  Administered 2017-02-11: 500 mg via ORAL
  Filled 2017-02-11: qty 2

## 2017-02-11 NOTE — Progress Notes (Signed)
Pt.'s mother & father at bedside & were upset that nobody called them.They were asking what happened to the pt.& asked for the charge nurse .Nikki Press photographer came to see pt.& talked to them.Pt.still not awake & alert.but arousable to painful stimuli only, then back to sleep.The house coverage Donnelly Stager came also & spoke to the parents.

## 2017-02-11 NOTE — Progress Notes (Signed)
Patient ID: Sharon [name], female   DOB: 1991/01/13, 26 y.o.   MRN: [id number]  PROGRESS NOTE    Sharon [name]  SRP:[id number] DOB: 25-Feb-1991 DOA: 02/10/2017 PCP: Patient, No Pcp Per   Brief Narrative:  26 year old female with history of headaches, carpal tunnel, arthritis, anxiety and substance abuse presented with decreased level of consciousness. She had to be given Narcan and she became slightly arousable. Urine drug screen was done.  Assessment & Plan:   Active Problems:   Acute encephalopathy   Acute probable toxic encephalopathy  - Urine drug positive for opiates, benzodiazepine and tetrahydrocannabinol - Monitor mental status. Patient is more awake this morning. - Continue IV fluids. - Repeat a.m. Labs  Drug overdose - Psychiatric consultation - Use Narcan if needed if mental status worsens  Long QT - Check electrolytes and EKG in a.m.  Pyuria - No evidence of UTI at this moment. Hold off on antibiotics.  DVT prophylaxis: SCDs Code Status:  Full Family Communication: Discussed with father, mother and fianc present at bedside Disposition Plan: Pending clinical improvement  Consultants: Psychiatry  Procedures: None  Antimicrobials: None    Subjective: Recent seen and examined at bedside. She is awake and answers some questions but intermittently gets angry and agitated  Objective: Vitals:   02/10/17 1500 02/10/17 1543 02/10/17 2047 02/11/17 0522  BP: 90/63  (!) 110/50 98/71  Pulse: 86  68 79  Resp: 16  16   Temp:  98.3 F (36.8 C) 97.6 F (36.4 C) 98.4 F (36.9 C)  TempSrc:  Oral Oral Oral  SpO2: 100%  100% 100%  Weight:   70.8 kg (156 lb 1.6 oz)   Height:        Intake/Output Summary (Last 24 hours) at 02/11/17 1055 Last data filed at 02/11/17 0600  Gross per 24 hour  Intake          1186.67 ml  Output                0 ml  Net          1186.67 ml   Filed Weights   02/10/17 0207 02/10/17 2047  Weight: 69.9 kg (154 lb) 70.8 kg (156 lb  1.6 oz)    Examination:  General exam: Appears Sleepy but wakes up and answers questions with  intermittently being angry  Respiratory system: Bilateral decreased breath sound at bases Cardiovascular system: S1 & S2 heard, rate controlled.  Gastrointestinal system: Abdomen is nondistended, soft and nontender. Normal bowel sounds heard. Central nervous system: Alert and oriented. No focal neurological deficits. Moving extremities Extremities: No cyanosis, clubbing, edema    Data Reviewed: I have personally reviewed following labs and imaging studies  CBC:  Recent Labs Lab 02/10/17 0213 02/11/17 0542  WBC 5.0 6.4  NEUTROABS 2.6  --   HGB 12.1 11.8*  HCT 37.3 37.0  MCV 97.9 97.1  PLT 183 181   Basic Metabolic Panel:  Recent Labs Lab 02/10/17 0213 02/10/17 1824 02/11/17 0542  NA 140  --  143  K 3.8  --  3.6  CL 105  --  109  CO2 28  --  32  GLUCOSE 138*  --  92  BUN 9  --  9  CREATININE 0.59  --  0.57  CALCIUM 8.8*  --  8.7*  MG  --  2.1  --   PHOS  --  4.1  --    GFR: Estimated Creatinine Clearance: 105.1 mL/min (by C-G formula  based on SCr of 0.57 mg/dL). Liver Function Tests:  Recent Labs Lab 02/10/17 0213 02/11/17 0542  AST 22 16  ALT 12* 13*  ALKPHOS 47 45  BILITOT 0.5 1.1  PROT 6.9 6.3*  ALBUMIN 4.0 3.3*   No results for input(s): LIPASE, AMYLASE in the last 168 hours. No results for input(s): AMMONIA in the last 168 hours. Coagulation Profile: No results for input(s): INR, PROTIME in the last 168 hours. Cardiac Enzymes: No results for input(s): CKTOTAL, CKMB, CKMBINDEX, TROPONINI in the last 168 hours. BNP (last 3 results) No results for input(s): PROBNP in the last 8760 hours. HbA1C: No results for input(s): HGBA1C in the last 72 hours. CBG:  Recent Labs Lab 02/10/17 0211 02/10/17 1341 02/10/17 1555  GLUCAP 138* 66 79   Lipid Profile: No results for input(s): CHOL, HDL, LDLCALC, TRIG, CHOLHDL, LDLDIRECT in the last 72  hours. Thyroid Function Tests: No results for input(s): TSH, T4TOTAL, FREET4, T3FREE, THYROIDAB in the last 72 hours. Anemia Panel: No results for input(s): VITAMINB12, FOLATE, FERRITIN, TIBC, IRON, RETICCTPCT in the last 72 hours. Sepsis Labs: No results for input(s): PROCALCITON, LATICACIDVEN in the last 168 hours.  No results found for this or any previous visit (from the past 240 hour(s)).       Radiology Studies: Ct Head Wo Contrast  Result Date: 02/10/2017 CLINICAL DATA:  Patient found unconscious. EXAM: CT HEAD WITHOUT CONTRAST TECHNIQUE: Contiguous axial images were obtained from the base of the skull through the vertex without intravenous contrast. COMPARISON:  None. FINDINGS: Brain: No evidence of acute infarction, hemorrhage, hydrocephalus, extra-axial collection or mass lesion/mass effect. Vascular: No hyperdense vessel or unexpected calcification. Skull: Normal. Negative for fracture or focal lesion. Sinuses/Orbits: There is debris in the left sphenoid sinus with mucosal thickening. Paranasal sinuses, mastoid air cells, and middle ears are otherwise normal. Other: None. IMPRESSION: 1. Mild sinus disease. No acute intracranial abnormality identified. Electronically Signed   By: Gerome Sam III M.D   On: 02/10/2017 16:37        Scheduled Meds: . nicotine  21 mg Transdermal Daily   Continuous Infusions: . dextrose 5 % and 0.45 % NaCl with KCl 20 mEq/L 100 mL/hr at 02/11/17 0452     LOS: 1 day        Glade Lloyd, MD [facility] Pager [phone]  If 7PM-7AM, please contact night-coverage [email or web address] Password [facility] 02/11/2017, 10:55 AM

## 2017-02-11 NOTE — Consult Note (Signed)
[facility] Consult   Reason for Consult:  Drug overdose and polysubstance abuse (opiates. benzo's and THC) Referring Physician:  Dr. Starla Link Patient Identification: Sharon [name] MRN:  [id number] Principal Diagnosis: <principal problem not specified> Diagnosis:   Patient Active Problem List   Diagnosis Date Noted  . Acute encephalopathy [G93.40] 02/10/2017  . Previous cesarean delivery, delivered, with or without mention of antepartum condition [O34.219] 05/17/2012    Total Time spent with patient: 1 hour  Subjective:   Sharon [name] is a 26 y.o. female patient admitted with AMS.  HPI:  Sharon [name] is a 26 y.o. female, seen, chart reviewed for this face-to-face psychiatry consultation and evaluation of altered mental status secondary to unintentional drug overdose. Information for this or evaluation obtained internally with patient and additional information is provided by her stepmother who is at bedside. Reportedly patient has been suffering with substance abuse especially Xanax and Percocet as over several years especially on and on. Patient has been suffering with  past medical history of headaches, carpal tunnel, and arthritis,  Patient stated she does not remember what happened after she took her Percocets and Xanax and Friday before starting home. Patient was not found for her whole Friday night and Saturday morning. Reportedly stranger find her with altered mental status near a gas station and called for the EMS for help. Patient was found to be drowsy not able to provide information on arrival. Patient Is awake, alert oriented to place person and time. Patient reported she cannot stay in hospital because she has no pain medication or Xanax and at the same time she is willing to get detox treatment while in the hospital. Patient has supportive stepmother who raised her since was 48 years old. Patient urine toxin is positive for opiates, benzos and  tetrahydrocannabinol  Past Psychiatric History: Polysubstance abuse but no previous acute psychiatric hospitalization or rehabilitation treatment.  Risk to Self: Is patient at risk for suicide?: No Risk to Others:   Prior Inpatient Therapy:   Prior Outpatient Therapy:    Past Medical History:  Past Medical History:  Diagnosis Date  . Anxiety    no meds  . Arthritis    right lower arm tendonitis  . Carpal tunnel syndrome   . Headache(784.0)    otc med prn    Past Surgical History:  Procedure Laterality Date  . CESAREAN SECTION  05/16/2012   Procedure: CESAREAN SECTION;  Surgeon: Shelly Bombard, MD;  Location: [facility];  Service: Obstetrics;  Laterality: N/A;  Primary  . FOOT SURGERY  1997   removal foreign body (sewing needle)  . TOOTH EXTRACTION  2008   upper incisor x2   Family History:  Family History  Problem Relation Age of Onset  . Seizures Father   . Diabetes Other   . CAD Other   . Hypertension Other   . Anesthesia problems Neg Hx   . Hypotension Neg Hx   . Malignant hyperthermia Neg Hx   . Pseudochol deficiency Neg Hx    Family Psychiatric  History: Significant for the polysubstance abuse in her biological mother. Social History:  History  Alcohol Use No    Comment: ocassionaly      History  Drug Use  . Types: Marijuana, Other-see comments, Cocaine    Comment: hx - weekly vicodin use, last marijuana use 05/05/12    Social History   Social History  . Marital status: Single    Spouse name: N/A  . Number  of children: N/A  . Years of education: N/A   Social History Main Topics  . Smoking status: Current Every Day Smoker    Packs/day: 0.25    Years: 4.00    Types: Cigarettes  . Smokeless tobacco: Former Systems developer  . Alcohol use No     Comment: ocassionaly   . Drug use: Yes    Types: Marijuana, Other-see comments, Cocaine     Comment: hx - weekly vicodin use, last marijuana use 05/05/12  . Sexual activity: Yes    Partners: Male    Birth control/  protection: None, Condom   Other Topics Concern  . None   Social History Narrative  . None   Additional Social History: Portably patient staying with her boyfriend, son who is 57 years old and boyfriend's sister who has chronic neurological disorder and boyfriend's father.     Allergies:  No Known Allergies  Labs:  Results for orders placed or performed during the hospital encounter of 02/10/17 (from the past 48 hour(s))  CBG monitoring, ED     Status: Abnormal   Collection Time: 02/10/17  2:11 AM  Result Value Ref Range   Glucose-Capillary 138 (H) 65 - 99 mg/dL  CBC with Differential     Status: Abnormal   Collection Time: 02/10/17  2:13 AM  Result Value Ref Range   WBC 5.0 4.0 - 10.5 K/uL   RBC 3.81 (L) 3.87 - 5.11 MIL/uL   Hemoglobin 12.1 12.0 - 15.0 g/dL   HCT 37.3 36.0 - 46.0 %   MCV 97.9 78.0 - 100.0 fL   MCH 31.8 26.0 - 34.0 pg   MCHC 32.4 30.0 - 36.0 g/dL   RDW 13.4 11.5 - 15.5 %   Platelets 183 150 - 400 K/uL   Neutrophils Relative % 51 %   Neutro Abs 2.6 1.7 - 7.7 K/uL   Lymphocytes Relative 41 %   Lymphs Abs 2.0 0.7 - 4.0 K/uL   Monocytes Relative 7 %   Monocytes Absolute 0.3 0.1 - 1.0 K/uL   Eosinophils Relative 1 %   Eosinophils Absolute 0.1 0.0 - 0.7 K/uL   Basophils Relative 0 %   Basophils Absolute 0.0 0.0 - 0.1 K/uL  Comprehensive metabolic panel     Status: Abnormal   Collection Time: 02/10/17  2:13 AM  Result Value Ref Range   Sodium 140 135 - 145 mmol/L   Potassium 3.8 3.5 - 5.1 mmol/L   Chloride 105 101 - 111 mmol/L   CO2 28 22 - 32 mmol/L   Glucose, Bld 138 (H) 65 - 99 mg/dL   BUN 9 6 - 20 mg/dL   Creatinine, Ser 0.59 0.44 - 1.00 mg/dL   Calcium 8.8 (L) 8.9 - 10.3 mg/dL   Total Protein 6.9 6.5 - 8.1 g/dL   Albumin 4.0 3.5 - 5.0 g/dL   AST 22 15 - 41 U/L   ALT 12 (L) 14 - 54 U/L   Alkaline Phosphatase 47 38 - 126 U/L   Total Bilirubin 0.5 0.3 - 1.2 mg/dL   GFR calc non Af Amer >60 >60 mL/min   GFR calc Af Amer >60 >60 mL/min    Comment:  (NOTE) The eGFR has been calculated using the CKD EPI equation. This calculation has not been validated in all clinical situations. eGFR's persistently <60 mL/min signify possible Chronic Kidney Disease.    Anion gap 7 5 - 15  Ethanol     Status: None   Collection Time: 02/10/17  2:13  AM  Result Value Ref Range   Alcohol, Ethyl (B) <5 <5 mg/dL    Comment:        LOWEST DETECTABLE LIMIT FOR SERUM ALCOHOL IS 5 mg/dL FOR MEDICAL PURPOSES ONLY   Acetaminophen level     Status: Abnormal   Collection Time: 02/10/17  2:13 AM  Result Value Ref Range   Acetaminophen (Tylenol), Serum <10 (L) 10 - 30 ug/mL    Comment:        THERAPEUTIC CONCENTRATIONS VARY SIGNIFICANTLY. A RANGE OF 10-30 ug/mL MAY BE AN EFFECTIVE CONCENTRATION FOR MANY PATIENTS. HOWEVER, SOME ARE BEST TREATED AT CONCENTRATIONS OUTSIDE THIS RANGE. ACETAMINOPHEN CONCENTRATIONS >150 ug/mL AT 4 HOURS AFTER INGESTION AND >50 ug/mL AT 12 HOURS AFTER INGESTION ARE OFTEN ASSOCIATED WITH TOXIC REACTIONS.   Salicylate level     Status: None   Collection Time: 02/10/17  2:13 AM  Result Value Ref Range   Salicylate Lvl <1.5 2.8 - 30.0 mg/dL  I-Stat Beta hCG blood, ED ([facility], [facility], [facility] only)     Status: None   Collection Time: 02/10/17  2:22 AM  Result Value Ref Range   I-stat hCG, quantitative <5.0 <5 mIU/mL   Comment 3            Comment:   GEST. AGE      CONC.  (mIU/mL)   <=1 WEEK        5 - 50     2 WEEKS       50 - 500     3 WEEKS       100 - 10,000     4 WEEKS     1,000 - 30,000        FEMALE AND NON-PREGNANT FEMALE:     LESS THAN 5 mIU/mL   Rapid urine drug screen (hospital performed)     Status: Abnormal   Collection Time: 02/10/17  3:32 AM  Result Value Ref Range   Opiates POSITIVE (A) NONE DETECTED   Cocaine NONE DETECTED NONE DETECTED   Benzodiazepines POSITIVE (A) NONE DETECTED   Amphetamines NONE DETECTED NONE DETECTED   Tetrahydrocannabinol POSITIVE (A) NONE DETECTED   Barbiturates NONE DETECTED NONE  DETECTED    Comment:        DRUG SCREEN FOR MEDICAL PURPOSES ONLY.  IF CONFIRMATION IS NEEDED FOR ANY PURPOSE, NOTIFY LAB WITHIN 5 DAYS.        LOWEST DETECTABLE LIMITS FOR URINE DRUG SCREEN Drug Class       Cutoff (ng/mL) Amphetamine      1000 Barbiturate      200 Benzodiazepine   615 Tricyclics       379 Opiates          300 Cocaine          300 THC              50   CBG monitoring, ED     Status: None   Collection Time: 02/10/17  1:41 PM  Result Value Ref Range   Glucose-Capillary 66 65 - 99 mg/dL  CBG monitoring, ED     Status: None   Collection Time: 02/10/17  3:55 PM  Result Value Ref Range   Glucose-Capillary 79 65 - 99 mg/dL  Magnesium     Status: None   Collection Time: 02/10/17  6:24 PM  Result Value Ref Range   Magnesium 2.1 1.7 - 2.4 mg/dL  Phosphorus     Status: None   Collection Time: 02/10/17  6:24  PM  Result Value Ref Range   Phosphorus 4.1 2.5 - 4.6 mg/dL  Basic metabolic panel     Status: Abnormal   Collection Time: 02/11/17  5:42 AM  Result Value Ref Range   Sodium 143 135 - 145 mmol/L   Potassium 3.6 3.5 - 5.1 mmol/L   Chloride 109 101 - 111 mmol/L   CO2 32 22 - 32 mmol/L   Glucose, Bld 92 65 - 99 mg/dL   BUN 9 6 - 20 mg/dL   Creatinine, Ser 0.57 0.44 - 1.00 mg/dL   Calcium 8.7 (L) 8.9 - 10.3 mg/dL   GFR calc non Af Amer >60 >60 mL/min   GFR calc Af Amer >60 >60 mL/min    Comment: (NOTE) The eGFR has been calculated using the CKD EPI equation. This calculation has not been validated in all clinical situations. eGFR's persistently <60 mL/min signify possible Chronic Kidney Disease.    Anion gap 2 (L) 5 - 15  CBC     Status: Abnormal   Collection Time: 02/11/17  5:42 AM  Result Value Ref Range   WBC 6.4 4.0 - 10.5 K/uL   RBC 3.81 (L) 3.87 - 5.11 MIL/uL   Hemoglobin 11.8 (L) 12.0 - 15.0 g/dL   HCT 37.0 36.0 - 46.0 %   MCV 97.1 78.0 - 100.0 fL   MCH 31.0 26.0 - 34.0 pg   MCHC 31.9 30.0 - 36.0 g/dL   RDW 13.4 11.5 - 15.5 %   Platelets  181 150 - 400 K/uL  Hepatic function panel     Status: Abnormal   Collection Time: 02/11/17  5:42 AM  Result Value Ref Range   Total Protein 6.3 (L) 6.5 - 8.1 g/dL   Albumin 3.3 (L) 3.5 - 5.0 g/dL   AST 16 15 - 41 U/L   ALT 13 (L) 14 - 54 U/L   Alkaline Phosphatase 45 38 - 126 U/L   Total Bilirubin 1.1 0.3 - 1.2 mg/dL   Bilirubin, Direct 0.1 0.1 - 0.5 mg/dL   Indirect Bilirubin 1.0 (H) 0.3 - 0.9 mg/dL  Urinalysis, Routine w reflex microscopic     Status: Abnormal   Collection Time: 02/11/17 10:02 AM  Result Value Ref Range   Color, Urine AMBER (A) YELLOW    Comment: BIOCHEMICALS MAY BE AFFECTED BY COLOR   APPearance CLOUDY (A) CLEAR   Specific Gravity, Urine 1.026 1.005 - 1.030   pH 5.0 5.0 - 8.0   Glucose, UA NEGATIVE NEGATIVE mg/dL   Hgb urine dipstick MODERATE (A) NEGATIVE   Bilirubin Urine NEGATIVE NEGATIVE   Ketones, ur NEGATIVE NEGATIVE mg/dL   Protein, ur 30 (A) NEGATIVE mg/dL   Nitrite POSITIVE (A) NEGATIVE   Leukocytes, UA LARGE (A) NEGATIVE   RBC / HPF 6-30 0 - 5 RBC/hpf   WBC, UA TOO NUMEROUS TO COUNT 0 - 5 WBC/hpf   Bacteria, UA MANY (A) NONE SEEN   Squamous Epithelial / LPF TOO NUMEROUS TO COUNT (A) NONE SEEN   Mucous PRESENT    Ca Oxalate Crys, UA PRESENT    Non Squamous Epithelial 0-5 (A) NONE SEEN    Current Facility-Administered Medications  Medication Dose Route Frequency Provider Last Rate Last Dose  . acetaminophen (TYLENOL) tablet 650 mg  650 mg Oral Q6H PRN Elwin Mocha, MD       Or  . acetaminophen (TYLENOL) suppository 650 mg  650 mg Rectal Q6H PRN Elwin Mocha, MD      . dextrose  5 % and 0.45 % NaCl with KCl 20 mEq/L infusion   Intravenous Continuous Aline August, MD 75 mL/hr at 02/11/17 1107    . ibuprofen (ADVIL,MOTRIN) tablet 400 mg  400 mg Oral Q6H PRN Aline August, MD   400 mg at 02/11/17 0805  . naloxone [facility]) injection 0.4 mg  0.4 mg Intravenous PRN Elwin Mocha, MD      . nicotine (NICODERM CQ - dosed in mg/24 hours)  patch 21 mg  21 mg Transdermal Daily Aline August, MD   21 mg at 02/11/17 1107  . ondansetron (ZOFRAN) tablet 4 mg  4 mg Oral Q6H PRN Elwin Mocha, MD       Or  . ondansetron [facility]) injection 4 mg  4 mg Intravenous Q6H PRN Elwin Mocha, MD        Musculoskeletal: Strength & Muscle Tone: decreased Gait & Station: unable to stand Patient leans: N/A  Psychiatric Specialty Exam: Physical Exam as per history and physical   ROS complaining about opioid and benzodiazepine withdrawal, cravings, sweating, shaking, tremulous, denied  shortness of breath and chest pain. No Fever-chills, No Headache, No changes with Vision or hearing, reports vertigo No problems swallowing food or Liquids, No Chest pain, Cough or Shortness of Breath, No Abdominal pain, No Nausea or Vommitting, Bowel movements are regular, No Blood in stool or Urine, No dysuria, No new skin rashes or bruises, No new joints pains-aches,  No new weakness, tingling, numbness in any extremity, No recent weight gain or loss, No polyuria, polydypsia or polyphagia,  A full 10 point Review of Systems was done, except as stated above, all other Review of Systems were negative.  Blood pressure 98/71, pulse 79, temperature 98.4 F (36.9 C), temperature source Oral, resp. rate 16, height 5' 5" (1.651 m), weight 70.8 kg (156 lb 1.6 oz), last menstrual period 01/15/2017, SpO2 100 %.Body mass index is 25.98 kg/m.  General Appearance: Disheveled and Guarded  Eye Contact:  Fair  Speech:  Blocked and Slurred  Volume:  Decreased  Mood:  Anxious and Depressed  Affect:  Depressed and Tearful  Thought Process:  Coherent  Orientation:  Full (Time, Place, and Person)  Thought Content:  Logical  Suicidal Thoughts:  she has intentional overdose of opiates and benzo but denied suicide ideations.  Homicidal Thoughts:  No  Memory:  Immediate;   Fair Recent;   Poor Remote;   Good  Judgement:  Impaired  Insight:  Shallow  Psychomotor  Activity:  Decreased and Restlessness  Concentration:  Concentration: Fair and Attention Span: Poor  Recall:  Poor  Fund of Knowledge:  Fair  Language:  Good  Akathisia:  Negative  Handed:  Right  AIMS (if indicated):     Assets:  Communication Skills Desire for Improvement Housing Intimacy Leisure Time Resilience Social Support Transportation Vocational/Educational  ADL's:  Impaired  Cognition:  Impaired,  Mild  Sleep:        Treatment Plan Summary: 26 years old female with a history of depression, anxiety and polysubstance abuse especially opiates and benzodiazepines and also smoking tetrahydrocannabinol presented with missing from action for more than 24 hours and found with altered mental status and local gas station. Patient has an intentional drug overdose secondary to conflict with her biological mother who had a history of drug addiction. Patient reported withdrawal symptoms from opiates and benzos.  Recommendation: Will start clonidine detox treatment for opiates and Librium detox treatment for benzodiazepines. Patient's stepmother and other family members has  been supportive to her. Daily contact with patient to assess and evaluate symptoms and progress in treatment and Medication management  Disposition: Supportive therapy provided about ongoing stressors.  Ambrose Finland, MD 02/11/2017 12:12 PM

## 2017-02-12 LAB — HIV ANTIBODY (ROUTINE TESTING W REFLEX): HIV SCREEN 4TH GENERATION: NONREACTIVE

## 2017-02-12 LAB — COMPREHENSIVE METABOLIC PANEL
ALBUMIN: 3.4 g/dL — AB (ref 3.5–5.0)
ALT: 29 U/L (ref 14–54)
AST: 42 U/L — AB (ref 15–41)
Alkaline Phosphatase: 48 U/L (ref 38–126)
Anion gap: 5 (ref 5–15)
BILIRUBIN TOTAL: 0.9 mg/dL (ref 0.3–1.2)
BUN: 14 mg/dL (ref 6–20)
CO2: 27 mmol/L (ref 22–32)
CREATININE: 0.69 mg/dL (ref 0.44–1.00)
Calcium: 8.6 mg/dL — ABNORMAL LOW (ref 8.9–10.3)
Chloride: 111 mmol/L (ref 101–111)
GFR calc Af Amer: >60 mL/min (ref >60)
GFR calc non Af Amer: >60 mL/min (ref >60)
GLUCOSE: 123 mg/dL — AB (ref 65–99)
Potassium: 3.8 mmol/L (ref 3.5–5.1)
SODIUM: 143 mmol/L (ref 135–145)
TOTAL PROTEIN: 6.3 g/dL — AB (ref 6.5–8.1)

## 2017-02-12 LAB — CBC WITH DIFFERENTIAL/PLATELET
BASOS PCT: 0 %
Basophils Absolute: 0 10*3/uL (ref 0.0–0.1)
EOS ABS: 0.1 10*3/uL (ref 0.0–0.7)
Eosinophils Relative: 1 %
HEMATOCRIT: 36.9 % (ref 36.0–46.0)
HEMOGLOBIN: 11.6 g/dL — AB (ref 12.0–15.0)
Lymphocytes Relative: 37 %
Lymphs Abs: 3.1 10*3/uL (ref 0.7–4.0)
MCH: 30.4 pg (ref 26.0–34.0)
MCHC: 31.4 g/dL (ref 30.0–36.0)
MCV: 96.6 fL (ref 78.0–100.0)
Monocytes Absolute: 0.6 10*3/uL (ref 0.1–1.0)
Monocytes Relative: 7 %
NEUTROS ABS: 4.7 10*3/uL (ref 1.7–7.7)
NEUTROS PCT: 55 %
Platelets: 168 10*3/uL (ref 150–400)
RBC: 3.82 MIL/uL — AB (ref 3.87–5.11)
RDW: 13.3 % (ref 11.5–15.5)
WBC: 8.4 10*3/uL (ref 4.0–10.5)

## 2017-02-12 LAB — MAGNESIUM: Magnesium: 2 mg/dL (ref 1.7–2.4)

## 2017-02-12 MED ORDER — FLUOXETINE HCL 10 MG PO CAPS
10.0000 mg | ORAL_CAPSULE | Freq: Every day | ORAL | Status: DC
Start: 2017-02-12 — End: 2017-02-14
  Administered 2017-02-12 – 2017-02-14 (×3): 10 mg via ORAL
  Filled 2017-02-12 (×3): qty 1

## 2017-02-12 MED ORDER — DIPHENHYDRAMINE HCL 25 MG PO CAPS
25.0000 mg | ORAL_CAPSULE | Freq: Every evening | ORAL | Status: DC | PRN
  Administered 2017-02-12 – 2017-02-13 (×2): 25 mg via ORAL
  Filled 2017-02-12 (×2): qty 1

## 2017-02-12 NOTE — Progress Notes (Signed)
Pt's HR in upper 30's-low 40's sustained. MD notified.

## 2017-02-12 NOTE — Progress Notes (Signed)
Patient ID: Sharon [name], female   DOB: 05-27-1991, 26 y.o.   MRN: [id number]  PROGRESS NOTE    JONITA HIROTA  NWG:[id number] DOB: 14-May-1991 DOA: 02/10/2017 PCP: Patient, No Pcp Per   Brief Narrative:  26 year old female with history of headaches, carpal tunnel, arthritis, anxiety and substance abuse presented with decreased level of consciousness. She had to be given Narcan and she became slightly arousable. Urine drug screen was positive. Psychiatry has evaluated the patient.   Assessment & Plan:   Active Problems:   Acute encephalopathy   Acute probable toxic encephalopathy  - Urine drug positive for opiates, benzodiazepine and tetrahydrocannabinol - Mental status is much improved. Monitor mental status.  - Discontinue IV fluids. - Repeat a.m. Labs  Drug overdose - Psychiatric consultation appreciated. Patient has been started on medications for opiate and benzodiazepine withdrawal by psychiatry - await further recommend is from psychiatry. - Child psychotherapist and care management consult  Anxiety and depression - Follow psychiatric recommendations  Long QT - Improved  Pyuria - No evidence of UTI at this moment. Hold off on antibiotics.  DVT prophylaxis: SCDs Code Status:  Full Family Communication: Discussed with the patient and boyfriend present at bedside Disposition Plan: Pending clinical improvement  Consultants: Psychiatry  Procedures: None  Antimicrobials: None   Subjective: Patient seen and examined at bedside. She is more awake and answers some questions. She complains of lower extent to pain.  Objective: Vitals:   02/11/17 2222 02/12/17 0325 02/12/17 0624 02/12/17 1006  BP: (!) 108/52 111/60 (!) 103/54 (!) 109/51  Pulse: 78 72 62   Resp:  16    Temp: 98.2 F (36.8 C) 98.1 F (36.7 C) 98.4 F (36.9 C)   TempSrc: Oral Oral Oral   SpO2: 100% 100% 100%   Weight:      Height:        Intake/Output Summary (Last 24 hours) at 02/12/17  1139 Last data filed at 02/12/17 1020  Gross per 24 hour  Intake              360 ml  Output              600 ml  Net             -240 ml   Filed Weights   02/10/17 0207 02/10/17 2047  Weight: 69.9 kg (154 lb) 70.8 kg (156 lb 1.6 oz)    Examination:  General exam: Appears calm and comfortable  Respiratory system: Bilateral decreased breath sound at bases Cardiovascular system: S1 & S2 heard, Rate controlled Gastrointestinal system: Abdomen is nondistended, soft and nontender. Normal bowel sounds heard. Central nervous system: Alert and oriented. No focal neurological deficits. Moving extremities Extremities: No cyanosis, clubbing, edema    Data Reviewed: I have personally reviewed following labs and imaging studies  CBC:  Recent Labs Lab 02/10/17 0213 02/11/17 0542 02/12/17 0316  WBC 5.0 6.4 8.4  NEUTROABS 2.6  --  4.7  HGB 12.1 11.8* 11.6*  HCT 37.3 37.0 36.9  MCV 97.9 97.1 96.6  PLT 183 181 168   Basic Metabolic Panel:  Recent Labs Lab 02/10/17 0213 02/10/17 1824 02/11/17 0542 02/12/17 0316  NA 140  --  143 143  K 3.8  --  3.6 3.8  CL 105  --  109 111  CO2 28  --  32 27  GLUCOSE 138*  --  92 123*  BUN 9  --  9 14  CREATININE 0.59  --  0.57 0.69  CALCIUM 8.8*  --  8.7* 8.6*  MG  --  2.1  --  2.0  PHOS  --  4.1  --   --    GFR: Estimated Creatinine Clearance: 105.1 mL/min (by C-G formula based on SCr of 0.69 mg/dL). Liver Function Tests:  Recent Labs Lab 02/10/17 0213 02/11/17 0542 02/12/17 0316  AST 22 16 42*  ALT 12* 13* 29  ALKPHOS 47 45 48  BILITOT 0.5 1.1 0.9  PROT 6.9 6.3* 6.3*  ALBUMIN 4.0 3.3* 3.4*   No results for input(s): LIPASE, AMYLASE in the last 168 hours. No results for input(s): AMMONIA in the last 168 hours. Coagulation Profile: No results for input(s): INR, PROTIME in the last 168 hours. Cardiac Enzymes: No results for input(s): CKTOTAL, CKMB, CKMBINDEX, TROPONINI in the last 168 hours. BNP (last 3 results) No results  for input(s): PROBNP in the last 8760 hours. HbA1C: No results for input(s): HGBA1C in the last 72 hours. CBG:  Recent Labs Lab 02/10/17 0211 02/10/17 1341 02/10/17 1555  GLUCAP 138* 66 79   Lipid Profile: No results for input(s): CHOL, HDL, LDLCALC, TRIG, CHOLHDL, LDLDIRECT in the last 72 hours. Thyroid Function Tests: No results for input(s): TSH, T4TOTAL, FREET4, T3FREE, THYROIDAB in the last 72 hours. Anemia Panel: No results for input(s): VITAMINB12, FOLATE, FERRITIN, TIBC, IRON, RETICCTPCT in the last 72 hours. Sepsis Labs: No results for input(s): PROCALCITON, LATICACIDVEN in the last 168 hours.  No results found for this or any previous visit (from the past 240 hour(s)).       Radiology Studies: Ct Head Wo Contrast  Result Date: 02/10/2017 CLINICAL DATA:  Patient found unconscious. EXAM: CT HEAD WITHOUT CONTRAST TECHNIQUE: Contiguous axial images were obtained from the base of the skull through the vertex without intravenous contrast. COMPARISON:  None. FINDINGS: Brain: No evidence of acute infarction, hemorrhage, hydrocephalus, extra-axial collection or mass lesion/mass effect. Vascular: No hyperdense vessel or unexpected calcification. Skull: Normal. Negative for fracture or focal lesion. Sinuses/Orbits: There is debris in the left sphenoid sinus with mucosal thickening. Paranasal sinuses, mastoid air cells, and middle ears are otherwise normal. Other: None. IMPRESSION: 1. Mild sinus disease. No acute intracranial abnormality identified. Electronically Signed   By: Gerome Sam III M.D   On: 02/10/2017 16:37        Scheduled Meds: . chlordiazePOXIDE  25 mg Oral QID   Followed by  . chlordiazePOXIDE  25 mg Oral TID   Followed by  . [START ON 02/13/2017] chlordiazePOXIDE  25 mg Oral BH-qamhs   Followed by  . [START ON 02/14/2017] chlordiazePOXIDE  25 mg Oral Daily  . cloNIDine  0.1 mg Oral QID   Followed by  . [START ON 02/13/2017] cloNIDine  0.1 mg Oral  BH-qamhs   Followed by  . [START ON 02/16/2017] cloNIDine  0.1 mg Oral QAC breakfast  . multivitamin with minerals  1 tablet Oral Daily  . nicotine  21 mg Transdermal Daily   Continuous Infusions: . dextrose 5 % and 0.45 % NaCl with KCl 20 mEq/L 75 mL/hr at 02/12/17 0802     LOS: 2 days        Glade Lloyd, MD [facility] Pager [phone]  If 7PM-7AM, please contact night-coverage [email or web address] Password [facility] 02/12/2017, 11:39 AM

## 2017-02-12 NOTE — Consult Note (Signed)
[place] Psychiatry Consult   Reason for Consult:  Drug overdose and polysubstance abuse (opiates. benzo's and THC) Referring Physician:  Dr. Starla Link Patient Identification: Sharon [name] MRN:  [id number] Principal Diagnosis: <principal problem not specified> Diagnosis:   Patient Active Problem List   Diagnosis Date Noted  . Acute encephalopathy [G93.40] 02/10/2017  . Previous cesarean delivery, delivered, with or without mention of antepartum condition [O34.219] 05/17/2012    Total Time spent with patient: 1 hour  Subjective:   Sharon [name] is a 26 y.o. female patient admitted with AMS.  HPI:  Sharon [name] is a 26 y.o. female, seen, chart reviewed for this face-to-face psychiatry consultation and evaluation of altered mental status secondary to unintentional drug overdose. Information for this or evaluation obtained internally with patient and additional information is provided by her stepmother who is at bedside. Reportedly patient has been suffering with substance abuse especially Xanax and Percocet as over several years especially on and on. Patient has been suffering with  past medical history of headaches, carpal tunnel, and arthritis,  Patient stated she does not remember what happened after she took her Percocets and Xanax and Friday before starting home. Patient was not found for her whole Friday night and Saturday morning. Reportedly stranger find her with altered mental status near a gas station and called for the EMS for help. Patient was found to be drowsy not able to provide information on arrival. Patient Is awake, alert oriented to place person and time. Patient reported she cannot stay in hospital because she has no pain medication or Xanax and at the same time she is willing to get detox treatment while in the hospital. Patient has supportive stepmother who raised her since was 31 years old. Patient urine toxin is positive for opiates, benzos and  tetrahydrocannabinol  Past Psychiatric History: Polysubstance abuse but no previous acute psychiatric hospitalization or rehabilitation treatment.  02/12/2017 Interval history: Patient seen today for these face-to-face psychiatric consultation follow-up. Patient has been tolerating her detox protocol for opiates and benzodiazepines without having any difficulty. Patient endorses depression anxiety and stresses related to her contact with her biological mother. Patient is willing to dissipate in medication management for depression anxiety with Prozac 10 mg daily and Benadryl 25 mg at bedtime as needed for insomnia. Patient is willing to stay away from opiates and Xanax from the streets. Patient child father who is at bedside seems to be supportive for her to get the treatment she desires or needs. She'll contract for safety while in the hospital. Patient stated it is a intentional overdose to control her emotions and anxiety but not having intention to end her life. Patient is willing to follow up with outpatient counseling services.  Risk to Self: Is patient at risk for suicide?: No Risk to Others:   Prior Inpatient Therapy:   Prior Outpatient Therapy:    Past Medical History:  Past Medical History:  Diagnosis Date  . Anxiety    no meds  . Arthritis    right lower arm tendonitis  . Carpal tunnel syndrome   . Headache(784.0)    otc med prn    Past Surgical History:  Procedure Laterality Date  . CESAREAN SECTION  05/16/2012   Procedure: CESAREAN SECTION;  Surgeon: Shelly Bombard, MD;  Location: [facility];  Service: Obstetrics;  Laterality: N/A;  Primary  . FOOT SURGERY  1997   removal foreign body (sewing needle)  . TOOTH EXTRACTION  2008   upper incisor  x2   Family History:  Family History  Problem Relation Age of Onset  . Seizures Father   . Diabetes Other   . CAD Other   . Hypertension Other   . Anesthesia problems Neg Hx   . Hypotension Neg Hx   . Malignant hyperthermia Neg  Hx   . Pseudochol deficiency Neg Hx    Family Psychiatric  History: Significant for the polysubstance abuse in her biological mother. Social History:  History  Alcohol Use No    Comment: ocassionaly      History  Drug Use  . Types: Marijuana, Other-see comments, Oxycodone    Comment: hx - weekly vicodin use, last marijuana use 05/05/12    Social History   Social History  . Marital status: Single    Spouse name: N/A  . Number of children: N/A  . Years of education: N/A   Occupational History  . Waitress    Social History Main Topics  . Smoking status: Current Every Day Smoker    Packs/day: 1.00    Years: 4.00    Types: Cigarettes  . Smokeless tobacco: Former Systems developer  . Alcohol use No     Comment: ocassionaly   . Drug use: Yes    Types: Marijuana, Other-see comments, Oxycodone     Comment: hx - weekly vicodin use, last marijuana use 05/05/12  . Sexual activity: Yes    Partners: Male    Birth control/ protection: None, Condom   Other Topics Concern  . None   Social History Narrative  . None   Additional Social History: Portably patient staying with her boyfriend, son who is 62 years old and boyfriend's sister who has chronic neurological disorder and boyfriend's father.     Allergies:  No Known Allergies  Labs:  Results for orders placed or performed during the hospital encounter of 02/10/17 (from the past 48 hour(s))  CBG monitoring, ED     Status: None   Collection Time: 02/10/17  1:41 PM  Result Value Ref Range   Glucose-Capillary 66 65 - 99 mg/dL  CBG monitoring, ED     Status: None   Collection Time: 02/10/17  3:55 PM  Result Value Ref Range   Glucose-Capillary 79 65 - 99 mg/dL  Magnesium     Status: None   Collection Time: 02/10/17  6:24 PM  Result Value Ref Range   Magnesium 2.1 1.7 - 2.4 mg/dL  Phosphorus     Status: None   Collection Time: 02/10/17  6:24 PM  Result Value Ref Range   Phosphorus 4.1 2.5 - 4.6 mg/dL  Basic metabolic panel      Status: Abnormal   Collection Time: 02/11/17  5:42 AM  Result Value Ref Range   Sodium 143 135 - 145 mmol/L   Potassium 3.6 3.5 - 5.1 mmol/L   Chloride 109 101 - 111 mmol/L   CO2 32 22 - 32 mmol/L   Glucose, Bld 92 65 - 99 mg/dL   BUN 9 6 - 20 mg/dL   Creatinine, Ser 0.57 0.44 - 1.00 mg/dL   Calcium 8.7 (L) 8.9 - 10.3 mg/dL   GFR calc non Af Amer >60 >60 mL/min   GFR calc Af Amer >60 >60 mL/min    Comment: (NOTE) The eGFR has been calculated using the CKD EPI equation. This calculation has not been validated in all clinical situations. eGFR's persistently <60 mL/min signify possible Chronic Kidney Disease.    Anion gap 2 (L) 5 - 15  CBC  Status: Abnormal   Collection Time: 02/11/17  5:42 AM  Result Value Ref Range   WBC 6.4 4.0 - 10.5 K/uL   RBC 3.81 (L) 3.87 - 5.11 MIL/uL   Hemoglobin 11.8 (L) 12.0 - 15.0 g/dL   HCT 37.0 36.0 - 46.0 %   MCV 97.1 78.0 - 100.0 fL   MCH 31.0 26.0 - 34.0 pg   MCHC 31.9 30.0 - 36.0 g/dL   RDW 13.4 11.5 - 15.5 %   Platelets 181 150 - 400 K/uL  Hepatic function panel     Status: Abnormal   Collection Time: 02/11/17  5:42 AM  Result Value Ref Range   Total Protein 6.3 (L) 6.5 - 8.1 g/dL   Albumin 3.3 (L) 3.5 - 5.0 g/dL   AST 16 15 - 41 U/L   ALT 13 (L) 14 - 54 U/L   Alkaline Phosphatase 45 38 - 126 U/L   Total Bilirubin 1.1 0.3 - 1.2 mg/dL   Bilirubin, Direct 0.1 0.1 - 0.5 mg/dL   Indirect Bilirubin 1.0 (H) 0.3 - 0.9 mg/dL  Urinalysis, Routine w reflex microscopic     Status: Abnormal   Collection Time: 02/11/17 10:02 AM  Result Value Ref Range   Color, Urine AMBER (A) YELLOW    Comment: BIOCHEMICALS MAY BE AFFECTED BY COLOR   APPearance CLOUDY (A) CLEAR   Specific Gravity, Urine 1.026 1.005 - 1.030   pH 5.0 5.0 - 8.0   Glucose, UA NEGATIVE NEGATIVE mg/dL   Hgb urine dipstick MODERATE (A) NEGATIVE   Bilirubin Urine NEGATIVE NEGATIVE   Ketones, ur NEGATIVE NEGATIVE mg/dL   Protein, ur 30 (A) NEGATIVE mg/dL   Nitrite POSITIVE (A)  NEGATIVE   Leukocytes, UA LARGE (A) NEGATIVE   RBC / HPF 6-30 0 - 5 RBC/hpf   WBC, UA TOO NUMEROUS TO COUNT 0 - 5 WBC/hpf   Bacteria, UA MANY (A) NONE SEEN   Squamous Epithelial / LPF TOO NUMEROUS TO COUNT (A) NONE SEEN   Mucus PRESENT    Ca Oxalate Crys, UA PRESENT    Non Squamous Epithelial 0-5 (A) NONE SEEN  CBC with Differential/Platelet     Status: Abnormal   Collection Time: 02/12/17  3:16 AM  Result Value Ref Range   WBC 8.4 4.0 - 10.5 K/uL   RBC 3.82 (L) 3.87 - 5.11 MIL/uL   Hemoglobin 11.6 (L) 12.0 - 15.0 g/dL   HCT 36.9 36.0 - 46.0 %   MCV 96.6 78.0 - 100.0 fL   MCH 30.4 26.0 - 34.0 pg   MCHC 31.4 30.0 - 36.0 g/dL   RDW 13.3 11.5 - 15.5 %   Platelets 168 150 - 400 K/uL   Neutrophils Relative % 55 %   Neutro Abs 4.7 1.7 - 7.7 K/uL   Lymphocytes Relative 37 %   Lymphs Abs 3.1 0.7 - 4.0 K/uL   Monocytes Relative 7 %   Monocytes Absolute 0.6 0.1 - 1.0 K/uL   Eosinophils Relative 1 %   Eosinophils Absolute 0.1 0.0 - 0.7 K/uL   Basophils Relative 0 %   Basophils Absolute 0.0 0.0 - 0.1 K/uL  Comprehensive metabolic panel     Status: Abnormal   Collection Time: 02/12/17  3:16 AM  Result Value Ref Range   Sodium 143 135 - 145 mmol/L   Potassium 3.8 3.5 - 5.1 mmol/L   Chloride 111 101 - 111 mmol/L   CO2 27 22 - 32 mmol/L   Glucose, Bld 123 (H) 65 - 99  mg/dL   BUN 14 6 - 20 mg/dL   Creatinine, Ser 0.69 0.44 - 1.00 mg/dL   Calcium 8.6 (L) 8.9 - 10.3 mg/dL   Total Protein 6.3 (L) 6.5 - 8.1 g/dL   Albumin 3.4 (L) 3.5 - 5.0 g/dL   AST 42 (H) 15 - 41 U/L   ALT 29 14 - 54 U/L   Alkaline Phosphatase 48 38 - 126 U/L   Total Bilirubin 0.9 0.3 - 1.2 mg/dL   GFR calc non Af Amer >60 >60 mL/min   GFR calc Af Amer >60 >60 mL/min    Comment: (NOTE) The eGFR has been calculated using the CKD EPI equation. This calculation has not been validated in all clinical situations. eGFR's persistently <60 mL/min signify possible Chronic Kidney Disease.    Anion gap 5 5 - 15  Magnesium      Status: None   Collection Time: 02/12/17  3:16 AM  Result Value Ref Range   Magnesium 2.0 1.7 - 2.4 mg/dL    Current Facility-Administered Medications  Medication Dose Route Frequency Provider Last Rate Last Dose  . acetaminophen (TYLENOL) tablet 650 mg  650 mg Oral Q6H PRN Elwin Mocha, MD       Or  . acetaminophen (TYLENOL) suppository 650 mg  650 mg Rectal Q6H PRN Elwin Mocha, MD      . chlordiazePOXIDE (LIBRIUM) capsule 25 mg  25 mg Oral Q6H PRN Ambrose Finland, MD      . chlordiazePOXIDE (LIBRIUM) capsule 25 mg  25 mg Oral QID Ambrose Finland, MD   25 mg at 02/12/17 1002   Followed by  . chlordiazePOXIDE (LIBRIUM) capsule 25 mg  25 mg Oral TID Ambrose Finland, MD       Followed by  . [START ON 02/13/2017] chlordiazePOXIDE (LIBRIUM) capsule 25 mg  25 mg Oral BH-qamhs Ambrose Finland, MD       Followed by  . [START ON 02/14/2017] chlordiazePOXIDE (LIBRIUM) capsule 25 mg  25 mg Oral Daily Bricen Victory, Arbutus Ped, MD      . cloNIDine (CATAPRES) tablet 0.1 mg  0.1 mg Oral QID Ambrose Finland, MD   0.1 mg at 02/12/17 1006   Followed by  . [START ON 02/13/2017] cloNIDine (CATAPRES) tablet 0.1 mg  0.1 mg Oral Elicia Lamp, MD       Followed by  . [START ON 02/16/2017] cloNIDine (CATAPRES) tablet 0.1 mg  0.1 mg Oral QAC breakfast Ambrose Finland, MD      . dicyclomine (BENTYL) tablet 20 mg  20 mg Oral Q6H PRN Ambrose Finland, MD      . hydrOXYzine (ATARAX/VISTARIL) tablet 25 mg  25 mg Oral Q6H PRN Ambrose Finland, MD      . loperamide (IMODIUM) capsule 2-4 mg  2-4 mg Oral PRN Ambrose Finland, MD      . methocarbamol (ROBAXIN) tablet 500 mg  500 mg Oral Q8H PRN Ambrose Finland, MD   500 mg at 02/12/17 1002  . multivitamin with minerals tablet 1 tablet  1 tablet Oral Daily Ambrose Finland, MD   1 tablet at 02/12/17 1003  . naloxone [facility]) injection 0.4 mg  0.4 mg  Intravenous PRN Elwin Mocha, MD      . naproxen (NAPROSYN) tablet 500 mg  500 mg Oral BID PRN Ambrose Finland, MD   500 mg at 02/11/17 1523  . nicotine (NICODERM CQ - dosed in mg/24 hours) patch 21 mg  21 mg Transdermal Daily Aline August, MD   21 mg  at 02/12/17 1003  . ondansetron (ZOFRAN) tablet 4 mg  4 mg Oral Q6H PRN Elwin Mocha, MD   4 mg at 02/11/17 1932   Or  . ondansetron (ZOFRAN) injection 4 mg  4 mg Intravenous Q6H PRN Elwin Mocha, MD        Musculoskeletal: Strength & Muscle Tone: decreased Gait & Station: unable to stand Patient leans: N/A  Psychiatric Specialty Exam: Physical Exam as per history and physical   ROS complaining about opioid and benzodiazepine withdrawal, cravings, sweating, shaking, tremulous, denied  shortness of breath and chest pain. No Fever-chills, No Headache, No changes with Vision or hearing, reports vertigo No problems swallowing food or Liquids, No Chest pain, Cough or Shortness of Breath, No Abdominal pain, No Nausea or Vommitting, Bowel movements are regular, No Blood in stool or Urine, No dysuria, No new skin rashes or bruises, No new joints pains-aches,  No new weakness, tingling, numbness in any extremity, No recent weight gain or loss, No polyuria, polydypsia or polyphagia,  A full 10 point Review of Systems was done, except as stated above, all other Review of Systems were negative.  Blood pressure (!) 109/51, pulse 62, temperature 98.4 F (36.9 C), temperature source Oral, resp. rate 16, height '5\' 5"'$  (1.651 m), weight 70.8 kg (156 lb 1.6 oz), last menstrual period 01/15/2017, SpO2 100 %.Body mass index is 25.98 kg/m.  General Appearance: Disheveled and Guarded  Eye Contact:  Fair  Speech:  Blocked and Slurred  Volume:  Decreased  Mood:  Anxious and Depressed  Affect:  Depressed and Tearful  Thought Process:  Coherent  Orientation:  Full (Time, Place, and Person)  Thought Content:  Logical  Suicidal  Thoughts:  she has intentional overdose of opiates and benzo but denied suicide ideations.  Homicidal Thoughts:  No  Memory:  Immediate;   Fair Recent;   Poor Remote;   Good  Judgement:  Impaired  Insight:  Shallow  Psychomotor Activity:  Decreased and Restlessness  Concentration:  Concentration: Fair and Attention Span: Poor  Recall:  Poor  Fund of Knowledge:  Fair  Language:  Good  Akathisia:  Negative  Handed:  Right  AIMS (if indicated):     Assets:  Communication Skills Desire for Improvement Housing Intimacy Leisure Time Resilience Social Support Transportation Vocational/Educational  ADL's:  Impaired  Cognition:  Impaired,  Mild  Sleep:        Treatment Plan Summary: 26 years old female with a history of depression, anxiety and polysubstance abuse especially opiates and benzodiazepines and also smoking tetrahydrocannabinol presented with missing from action for more than 24 hours and found with altered mental status and local gas station. Patient has an intentional drug overdose secondary to conflict with her biological mother who had a history of drug addiction. Patient reported withdrawal symptoms from opiates and benzos.  Recommendation: Continue clonidine detox treatment for opiates and Librium detox treatment for benzodiazepines. We start fluoxetine 10 mg daily for depression and anxiety and Benadryl 25 mg at bedtime as needed for insomnia Patient needed PT evaluation as she has been coming off of for altered mental status Counseled to stay free from opiates and Xanax Referred to the CSW provide necessary outpatient referrals   Disposition: Patient will be referred to the [facility] [place] for outpatient counseling services and medication management and medically stable and discharged  Supportive therapy provided about ongoing stressors.  Ambrose Finland, MD 02/12/2017 11:59 AM

## 2017-02-13 ENCOUNTER — Encounter (HOSPITAL_COMMUNITY): Payer: Self-pay

## 2017-02-13 ENCOUNTER — Inpatient Hospital Stay (HOSPITAL_COMMUNITY): Payer: Self-pay

## 2017-02-13 DIAGNOSIS — R9431 Abnormal electrocardiogram [ECG] [EKG]: Secondary | ICD-10-CM

## 2017-02-13 LAB — ECHOCARDIOGRAM COMPLETE
AVLVOTPG: 3 mmHg
CHL CUP MV DEC (S): 179
E/e' ratio: 6.29
EWDT: 179 ms
FS: 29 % (ref 28–44)
Height: 65 in
IVS/LV PW RATIO, ED: 0.8
LA diam end sys: 34 mm
LA diam index: 1.91 cm/m2
LA vol: 45.9 mL
LASIZE: 34 mm
LAVOLA4C: 45.1 mL
LAVOLIN: 25.8 mL/m2
LV E/e' medial: 6.29
LV E/e'average: 6.29
LV TDI E'LATERAL: 17.8
LV e' LATERAL: 17.8 cm/s
LVOT SV: 79 mL
LVOT VTI: 22.9 cm
LVOT area: 3.46 cm2
LVOT diameter: 21 mm
LVOT peak vel: 80.5 cm/s
Lateral S' vel: 17.2 cm/s
MV Peak grad: 5 mmHg
MV pk A vel: 48.5 m/s
MVPKEVEL: 112 m/s
PW: 7.91 mm — AB (ref 0.6–1.1)
RV sys press: 18 mmHg
Reg peak vel: 195 cm/s
TAPSE: 26.8 mm
TDI e' medial: 11.5
TRMAXVEL: 195 cm/s
Weight: 2486.79 oz

## 2017-02-13 LAB — BASIC METABOLIC PANEL
ANION GAP: 8 (ref 5–15)
BUN: 15 mg/dL (ref 6–20)
CALCIUM: 8.8 mg/dL — AB (ref 8.9–10.3)
CO2: 22 mmol/L (ref 22–32)
Chloride: 113 mmol/L — ABNORMAL HIGH (ref 101–111)
Creatinine, Ser: 0.65 mg/dL (ref 0.44–1.00)
Glucose, Bld: 108 mg/dL — ABNORMAL HIGH (ref 65–99)
POTASSIUM: 3.7 mmol/L (ref 3.5–5.1)
Sodium: 143 mmol/L (ref 135–145)

## 2017-02-13 LAB — CBC WITH DIFFERENTIAL/PLATELET
BASOS ABS: 0 10*3/uL (ref 0.0–0.1)
BASOS PCT: 1 %
Eosinophils Absolute: 0.1 10*3/uL (ref 0.0–0.7)
Eosinophils Relative: 1 %
HEMATOCRIT: 37.8 % (ref 36.0–46.0)
Hemoglobin: 12.3 g/dL (ref 12.0–15.0)
LYMPHS PCT: 33 %
Lymphs Abs: 2.7 10*3/uL (ref 0.7–4.0)
MCH: 31.1 pg (ref 26.0–34.0)
MCHC: 32.5 g/dL (ref 30.0–36.0)
MCV: 95.5 fL (ref 78.0–100.0)
MONO ABS: 0.6 10*3/uL (ref 0.1–1.0)
Monocytes Relative: 7 %
NEUTROS ABS: 4.9 10*3/uL (ref 1.7–7.7)
NEUTROS PCT: 58 %
Platelets: 218 10*3/uL (ref 150–400)
RBC: 3.96 MIL/uL (ref 3.87–5.11)
RDW: 13.3 % (ref 11.5–15.5)
WBC: 8.2 10*3/uL (ref 4.0–10.5)

## 2017-02-13 LAB — MAGNESIUM: Magnesium: 2.1 mg/dL (ref 1.7–2.4)

## 2017-02-13 NOTE — Evaluation (Signed)
Physical Therapy Evaluation Patient Details Name: Sharon [name] MRN: [id number] DOB: 26-Aug-1990 Today's Date: 02/13/2017   History of Present Illness  26 yo female with onset of syncopal epsode with positive tox screen, no PMHx other than substance abuse.  Clinical Impression  Pt is not demonstrating any instability related to gait or transfers that requires PT intervention.  Additionally had normal pulse ranges with gait and no LOB or dizziness or drops in O2 sats.  PT will sign off as she is walking with no assist on her own in the room and has not sustained a fall here.  No LOB observed, and will be glad to reconsult PT if anything changes.  Thank you for the PT referral.      Follow Up Recommendations No PT follow up    Equipment Recommendations  None recommended by PT    Recommendations for Other Services       Precautions / Restrictions Precautions Precautions: Other (comment) (has a Comptroller) Restrictions Weight Bearing Restrictions: No      Mobility  Bed Mobility Overal bed mobility: Independent                Transfers Overall transfer level: Independent                  Ambulation/Gait Ambulation/Gait assistance: Supervision (for safety) Ambulation Distance (Feet): 200 Feet Assistive device: None Gait Pattern/deviations: Step-through pattern;Drifts right/left;Narrow base of support Gait velocity: controlled Gait velocity interpretation: at or above normal speed for age/gender General Gait Details: pt has taken some steps laterally for a very minor shift but no LOB observed, pulses were 60-73 during gait  Stairs            Wheelchair Mobility    Modified Rankin (Stroke Patients Only)       Balance Overall balance assessment: No apparent balance deficits (not formally assessed);Independent                                           Pertinent Vitals/Pain Pain Assessment: No/denies pain    Home Living  Family/patient expects to be discharged to:: Private residence Living Arrangements: Spouse/significant other Available Help at Discharge: Family;Available PRN/intermittently Type of Home: House       Home Layout: One level Home Equipment: None      Prior Function Level of Independence: Independent               Hand Dominance        Extremity/Trunk Assessment   Upper Extremity Assessment Upper Extremity Assessment: Overall WFL for tasks assessed    Lower Extremity Assessment Lower Extremity Assessment: Overall WFL for tasks assessed    Cervical / Trunk Assessment Cervical / Trunk Assessment: Normal  Communication   Communication: No difficulties  Cognition Arousal/Alertness: Awake/alert Behavior During Therapy: WFL for tasks assessed/performed Overall Cognitive Status: Within Functional Limits for tasks assessed                                 General Comments: agreed to walk with PT but has been having some low pulses      General Comments General comments (skin integrity, edema, etc.): pt is walking on hallway with no LOB and pulses were in a normal range, no concerns for PT    Exercises     Assessment/Plan  PT Assessment Patent does not need any further PT services  PT Problem List         PT Treatment Interventions      PT Goals (Current goals can be found in the Care Plan section)       Frequency     Barriers to discharge        Co-evaluation               AM-PAC PT "6 Clicks" Daily Activity  Outcome Measure Difficulty turning over in bed (including adjusting bedclothes, sheets and blankets)?: None Difficulty moving from lying on back to sitting on the side of the bed? : None Difficulty sitting down on and standing up from a chair with arms (e.g., wheelchair, bedside commode, etc,.)?: None Help needed moving to and from a bed to chair (including a wheelchair)?: None Help needed walking in hospital room?:  None Help needed climbing 3-5 steps with a railing? : None 6 Click Score: 24    End of Session Equipment Utilized During Treatment: Other (comment) (no gait belt used as pt was able to walk in her room prir) Activity Tolerance: Patient tolerated treatment well Patient left: in bed;with call bell/phone within reach;with nursing/sitter in room;with family/visitor present Nurse Communication: Mobility status (pulses in normal range, returned to bed by preference) PT Visit Diagnosis: Other abnormalities of gait and mobility (R26.89)    Time: 3354-5625 PT Time Calculation (min) (ACUTE ONLY): 15 min   Charges:   PT Evaluation $PT Eval Low Complexity: 1 Low     PT G Codes:   PT G-Codes **NOT FOR INPATIENT CLASS** Functional Assessment Tool Used: AM-PAC 6 Clicks Basic Mobility    Ivar Drape 02/13/2017, 11:03 AM   Samul Dada, PT MS Acute Rehab Dept. Number: [facility] [id number] and [facility] [phone]

## 2017-02-13 NOTE — Progress Notes (Signed)
  Echocardiogram 2D Echocardiogram has been performed.  Delcie Roch 02/13/2017, 5:19 PM

## 2017-02-13 NOTE — Progress Notes (Signed)
Patient ID: [name], female   DOB: 1990-12-18, 26 y.o.   MRN: [id number]  PROGRESS NOTE    Sharon [name]  NWG:[id number] DOB: 12-08-1990 DOA: 02/10/2017 PCP: Patient, No Pcp Per   Brief Narrative: 26 year old female with history of headaches, carpal tunnel, arthritis, anxiety and substance abuse presented with decreased level of consciousness. She had to be given Narcan and she became slightly arousable. Urine drug screen was positive. Psychiatry has evaluated the patient.   Assessment & Plan:   Active Problems:   Acute encephalopathy   Acute probable toxic encephalopathy - Urine drug positive for opiates, benzodiazepine and tetrahydrocannabinol - Mental status is much improved. Monitor mental status.   Drug overdose - Patient currently on opiate and benzodiazepine withdrawal protocol with Clonidine and Librium respectively.  - Discontinue clonidine because of bradycardia. - Follow further recommendations from psychiatry - Social worker and care management consult  Bradycardia - Probably from clonidine. Discontinue clonidine and monitor. 2-D echo  Anxiety and depression - Prozac has been started by psychiatry. Follow further psychiatry recommendations  Long QT - Improved  Pyuria - No evidence of UTI at this moment. Hold off on antibiotics.  DVT prophylaxis:SCDs Code Status:Full Family Communication:  none at bedside Disposition Plan:Discharge once cleared by psychiatry  Consultants:Psychiatry  Procedures:None  Antimicrobials: None  Subjective: Patient seen and examined at bedside. She feels better today. No overnight fever, nausea or vomiting.  Objective: Vitals:   02/12/17 1745 02/12/17 2042 02/13/17 0424 02/13/17 0930  BP: (!) 101/56 (!) 95/48 (!) 100/52 (!) 101/43  Pulse:  (!) 43 (!) 43 (!) 43  Resp:  17 17   Temp:  98.3 F (36.8 C) 98.2 F (36.8 C)   TempSrc:  Oral Oral   SpO2:  100%    Weight:   70.5 kg (155 lb 6.8 oz)     Height:        Intake/Output Summary (Last 24 hours) at 02/13/17 1300 Last data filed at 02/12/17 1500  Gross per 24 hour  Intake              220 ml  Output                0 ml  Net              220 ml   Filed Weights   02/10/17 0207 02/10/17 2047 02/13/17 0424  Weight: 69.9 kg (154 lb) 70.8 kg (156 lb 1.6 oz) 70.5 kg (155 lb 6.8 oz)    Examination:  General exam: Appears calm and comfortable  Respiratory system: Bilateral decreased breath sound at bases Cardiovascular system: S1 & S2 heard, Bradycardic Gastrointestinal system: Abdomen is nondistended, soft and nontender. Normal bowel sounds heard. Central nervous system: Alert and oriented. No focal neurological deficits. Moving extremities Extremities: No cyanosis, clubbing, edema    Data Reviewed: I have personally reviewed following labs and imaging studies  CBC:  Recent Labs Lab 02/10/17 0213 02/11/17 0542 02/12/17 0316 02/13/17 0554  WBC 5.0 6.4 8.4 8.2  NEUTROABS 2.6  --  4.7 4.9  HGB 12.1 11.8* 11.6* 12.3  HCT 37.3 37.0 36.9 37.8  MCV 97.9 97.1 96.6 95.5  PLT 183 181 168 218   Basic Metabolic Panel:  Recent Labs Lab 02/10/17 0213 02/10/17 1824 02/11/17 0542 02/12/17 0316 02/13/17 0554  NA 140  --  143 143 143  K 3.8  --  3.6 3.8 3.7  CL 105  --  109 111 113*  CO2 28  --  32 27 22  GLUCOSE 138*  --  92 123* 108*  BUN 9  --  9 14 15   CREATININE 0.59  --  0.57 0.69 0.65  CALCIUM 8.8*  --  8.7* 8.6* 8.8*  MG  --  2.1  --  2.0 2.1  PHOS  --  4.1  --   --   --    GFR: Estimated Creatinine Clearance: 105 mL/min (by C-G formula based on SCr of 0.65 mg/dL). Liver Function Tests:  Recent Labs Lab 02/10/17 0213 02/11/17 0542 02/12/17 0316  AST 22 16 42*  ALT 12* 13* 29  ALKPHOS 47 45 48  BILITOT 0.5 1.1 0.9  PROT 6.9 6.3* 6.3*  ALBUMIN 4.0 3.3* 3.4*   No results for input(s): LIPASE, AMYLASE in the last 168 hours. No results for input(s): AMMONIA in the last 168 hours. Coagulation  Profile: No results for input(s): INR, PROTIME in the last 168 hours. Cardiac Enzymes: No results for input(s): CKTOTAL, CKMB, CKMBINDEX, TROPONINI in the last 168 hours. BNP (last 3 results) No results for input(s): PROBNP in the last 8760 hours. HbA1C: No results for input(s): HGBA1C in the last 72 hours. CBG:  Recent Labs Lab 02/10/17 0211 02/10/17 1341 02/10/17 1555  GLUCAP 138* 66 79   Lipid Profile: No results for input(s): CHOL, HDL, LDLCALC, TRIG, CHOLHDL, LDLDIRECT in the last 72 hours. Thyroid Function Tests: No results for input(s): TSH, T4TOTAL, FREET4, T3FREE, THYROIDAB in the last 72 hours. Anemia Panel: No results for input(s): VITAMINB12, FOLATE, FERRITIN, TIBC, IRON, RETICCTPCT in the last 72 hours. Sepsis Labs: No results for input(s): PROCALCITON, LATICACIDVEN in the last 168 hours.  No results found for this or any previous visit (from the past 240 hour(s)).       Radiology Studies: No results found.      Scheduled Meds: . chlordiazePOXIDE  25 mg Oral TID   Followed by  . chlordiazePOXIDE  25 mg Oral BH-qamhs   Followed by  . [START ON 02/14/2017] chlordiazePOXIDE  25 mg Oral Daily  . FLUoxetine  10 mg Oral Daily  . multivitamin with minerals  1 tablet Oral Daily  . nicotine  21 mg Transdermal Daily   Continuous Infusions:   LOS: 3 days        Glade Lloyd, MD [facility] Pager [phone]  If 7PM-7AM, please contact night-coverage [email or web address] Password [email or web address] 02/13/2017, 1:00 PM

## 2017-02-13 NOTE — Consult Note (Signed)
Sharon [name]   Reason for [name]:  Drug overdose and polysubstance abuse (opiates. benzo's and THC) Referring Physician:  Dr. Starla Link Patient Identification: Sharon [name] MRN:  [id number] Principal Diagnosis: <principal problem not specified> Diagnosis:   Patient Active Problem List   Diagnosis Date Noted  . Acute encephalopathy [G93.40] 02/10/2017  . Previous cesarean delivery, delivered, with or without mention of antepartum condition [O34.219] 05/17/2012    Total Time spent with patient: 1 hour  Subjective:   Sharon [name] is a 26 y.o. female patient admitted with AMS.  HPI:  Sharon [name] is a 26 y.o. female, seen, chart reviewed for this face-to-face psychiatry consultation and evaluation of altered mental status secondary to unintentional drug overdose. Information for this or evaluation obtained internally with patient and additional information is provided by her stepmother who is at bedside. Reportedly patient has been suffering with substance abuse especially Xanax and Percocet as over several years especially on and on. Patient has been suffering with  past medical history of headaches, carpal tunnel, and arthritis,  Patient stated she does not remember what happened after she took her Percocets and Xanax and Friday before starting home. Patient was not found for her whole Friday night and Saturday morning. Reportedly stranger find her with altered mental status near a gas station and called for the EMS for help. Patient was found to be drowsy not able to provide information on arrival. Patient Is awake, alert oriented to place person and time. Patient reported she cannot stay in hospital because she has no pain medication or Xanax and at the same time she is willing to get detox treatment while in the hospital. Patient has supportive stepmother who raised her since was 33 years old. Patient urine toxin is positive for opiates, benzos and  tetrahydrocannabinol  Past Psychiatric History: Polysubstance abuse but no previous acute psychiatric hospitalization or rehabilitation treatment.  02/13/2017 Interval history: Patient seen today for these face-to-face psychiatric consultation follow-up. Patient has no current symptoms of opioid withdrawal or benzodiazepine withdrawal and has been tolerating her medication for anxiety and depression which is fluoxetine and Benadryl. Patient contract for safety and willing to participate in outpatient psychiatric medication management and counseling at [facility] [place]. Patient is also considering possible participation in substance abuse rehabilitation at [facility] recovery services. Patient has a good family support and has a job and 44 years old son who she cares for. Patient described psychiatrically cleared at this time for the outpatient psychiatric medication management.  Risk to Self: Is patient at risk for suicide?: No Risk to Others:   Prior Inpatient Therapy:   Prior Outpatient Therapy:    Past Medical History:  Past Medical History:  Diagnosis Date  . Anxiety    no meds  . Arthritis    right lower arm tendonitis  . Carpal tunnel syndrome   . Headache(784.0)    otc med prn    Past Surgical History:  Procedure Laterality Date  . CESAREAN SECTION  05/16/2012   Procedure: CESAREAN SECTION;  Surgeon: Shelly Bombard, MD;  Location: [facility];  Service: Obstetrics;  Laterality: N/A;  Primary  . FOOT SURGERY  1997   removal foreign body (sewing needle)  . TOOTH EXTRACTION  2008   upper incisor x2   Family History:  Family History  Problem Relation Age of Onset  . Seizures Father   . Diabetes Other   . CAD Other   . Hypertension Other   .  Anesthesia problems Neg Hx   . Hypotension Neg Hx   . Malignant hyperthermia Neg Hx   . Pseudochol deficiency Neg Hx    Family Psychiatric  History: Significant for the polysubstance abuse in her biological mother. Social  History:  History  Alcohol Use No    Comment: ocassionaly      History  Drug Use  . Types: Marijuana, Other-see comments, Oxycodone    Comment: hx - weekly vicodin use, last marijuana use 05/05/12    Social History   Social History  . Marital status: Single    Spouse name: N/A  . Number of children: N/A  . Years of education: N/A   Occupational History  . Waitress    Social History Main Topics  . Smoking status: Current Every Day Smoker    Packs/day: 1.00    Years: 4.00    Types: Cigarettes  . Smokeless tobacco: Former Systems developer  . Alcohol use No     Comment: ocassionaly   . Drug use: Yes    Types: Marijuana, Other-see comments, Oxycodone     Comment: hx - weekly vicodin use, last marijuana use 05/05/12  . Sexual activity: Yes    Partners: Male    Birth control/ protection: None, Condom   Other Topics Concern  . None   Social History Narrative  . None   Additional Social History: Portably patient staying with her boyfriend, son who is 2 years old and boyfriend's sister who has chronic neurological disorder and boyfriend's father.     Allergies:  No Known Allergies  Labs:  Results for orders placed or performed during the hospital encounter of 02/10/17 (from the past 48 hour(s))  CBC with Differential/Platelet     Status: Abnormal   Collection Time: 02/12/17  3:16 AM  Result Value Ref Range   WBC 8.4 4.0 - 10.5 K/uL   RBC 3.82 (L) 3.87 - 5.11 MIL/uL   Hemoglobin 11.6 (L) 12.0 - 15.0 g/dL   HCT 36.9 36.0 - 46.0 %   MCV 96.6 78.0 - 100.0 fL   MCH 30.4 26.0 - 34.0 pg   MCHC 31.4 30.0 - 36.0 g/dL   RDW 13.3 11.5 - 15.5 %   Platelets 168 150 - 400 K/uL   Neutrophils Relative % 55 %   Neutro Abs 4.7 1.7 - 7.7 K/uL   Lymphocytes Relative 37 %   Lymphs Abs 3.1 0.7 - 4.0 K/uL   Monocytes Relative 7 %   Monocytes Absolute 0.6 0.1 - 1.0 K/uL   Eosinophils Relative 1 %   Eosinophils Absolute 0.1 0.0 - 0.7 K/uL   Basophils Relative 0 %   Basophils Absolute 0.0 0.0 -  0.1 K/uL  Comprehensive metabolic panel     Status: Abnormal   Collection Time: 02/12/17  3:16 AM  Result Value Ref Range   Sodium 143 135 - 145 mmol/L   Potassium 3.8 3.5 - 5.1 mmol/L   Chloride 111 101 - 111 mmol/L   CO2 27 22 - 32 mmol/L   Glucose, Bld 123 (H) 65 - 99 mg/dL   BUN 14 6 - 20 mg/dL   Creatinine, Ser 0.69 0.44 - 1.00 mg/dL   Calcium 8.6 (L) 8.9 - 10.3 mg/dL   Total Protein 6.3 (L) 6.5 - 8.1 g/dL   Albumin 3.4 (L) 3.5 - 5.0 g/dL   AST 42 (H) 15 - 41 U/L   ALT 29 14 - 54 U/L   Alkaline Phosphatase 48 38 - 126 U/L  Total Bilirubin 0.9 0.3 - 1.2 mg/dL   GFR calc non Af Amer >60 >60 mL/min   GFR calc Af Amer >60 >60 mL/min    Comment: (NOTE) The eGFR has been calculated using the CKD EPI equation. This calculation has not been validated in all clinical situations. eGFR's persistently <60 mL/min signify possible Chronic Kidney Disease.    Anion gap 5 5 - 15  Magnesium     Status: None   Collection Time: 02/12/17  3:16 AM  Result Value Ref Range   Magnesium 2.0 1.7 - 2.4 mg/dL  CBC with Differential/Platelet     Status: None   Collection Time: 02/13/17  5:54 AM  Result Value Ref Range   WBC 8.2 4.0 - 10.5 K/uL   RBC 3.96 3.87 - 5.11 MIL/uL   Hemoglobin 12.3 12.0 - 15.0 g/dL   HCT 37.8 36.0 - 46.0 %   MCV 95.5 78.0 - 100.0 fL   MCH 31.1 26.0 - 34.0 pg   MCHC 32.5 30.0 - 36.0 g/dL   RDW 13.3 11.5 - 15.5 %   Platelets 218 150 - 400 K/uL   Neutrophils Relative % 58 %   Neutro Abs 4.9 1.7 - 7.7 K/uL   Lymphocytes Relative 33 %   Lymphs Abs 2.7 0.7 - 4.0 K/uL   Monocytes Relative 7 %   Monocytes Absolute 0.6 0.1 - 1.0 K/uL   Eosinophils Relative 1 %   Eosinophils Absolute 0.1 0.0 - 0.7 K/uL   Basophils Relative 1 %   Basophils Absolute 0.0 0.0 - 0.1 K/uL  Basic metabolic panel     Status: Abnormal   Collection Time: 02/13/17  5:54 AM  Result Value Ref Range   Sodium 143 135 - 145 mmol/L   Potassium 3.7 3.5 - 5.1 mmol/L   Chloride 113 (H) 101 - 111 mmol/L    CO2 22 22 - 32 mmol/L   Glucose, Bld 108 (H) 65 - 99 mg/dL   BUN 15 6 - 20 mg/dL   Creatinine, Ser 0.65 0.44 - 1.00 mg/dL   Calcium 8.8 (L) 8.9 - 10.3 mg/dL   GFR calc non Af Amer >60 >60 mL/min   GFR calc Af Amer >60 >60 mL/min    Comment: (NOTE) The eGFR has been calculated using the CKD EPI equation. This calculation has not been validated in all clinical situations. eGFR's persistently <60 mL/min signify possible Chronic Kidney Disease.    Anion gap 8 5 - 15  Magnesium     Status: None   Collection Time: 02/13/17  5:54 AM  Result Value Ref Range   Magnesium 2.1 1.7 - 2.4 mg/dL    Current Facility-Administered Medications  Medication Dose Route Frequency Provider Last Rate Last Dose  . acetaminophen (TYLENOL) tablet 650 mg  650 mg Oral Q6H PRN Elwin Mocha, MD   650 mg at 02/12/17 2056   Or  . acetaminophen (TYLENOL) suppository 650 mg  650 mg Rectal Q6H PRN Elwin Mocha, MD      . chlordiazePOXIDE (LIBRIUM) capsule 25 mg  25 mg Oral Q6H PRN Ambrose Finland, MD      . chlordiazePOXIDE (LIBRIUM) capsule 25 mg  25 mg Oral TID Ambrose Finland, MD   25 mg at 02/13/17 6440   Followed by  . chlordiazePOXIDE (LIBRIUM) capsule 25 mg  25 mg Oral Elicia Lamp, MD       Followed by  . [START ON 02/14/2017] chlordiazePOXIDE (LIBRIUM) capsule 25 mg  25 mg Oral  Daily Ambrose Finland, MD      . dicyclomine (BENTYL) tablet 20 mg  20 mg Oral Q6H PRN Ambrose Finland, MD      . diphenhydrAMINE (BENADRYL) capsule 25 mg  25 mg Oral QHS PRN Ambrose Finland, MD   25 mg at 02/12/17 2104  . FLUoxetine (PROZAC) capsule 10 mg  10 mg Oral Daily Ambrose Finland, MD   10 mg at 02/13/17 9678  . loperamide (IMODIUM) capsule 2-4 mg  2-4 mg Oral PRN Ambrose Finland, MD      . methocarbamol (ROBAXIN) tablet 500 mg  500 mg Oral Q8H PRN Ambrose Finland, MD   500 mg at 02/13/17 1103  . multivitamin with minerals  tablet 1 tablet  1 tablet Oral Daily Ambrose Finland, MD   1 tablet at 02/13/17 0936  . naloxone [facility]) injection 0.4 mg  0.4 mg Intravenous PRN Elwin Mocha, MD      . naproxen (NAPROSYN) tablet 500 mg  500 mg Oral BID PRN Ambrose Finland, MD   500 mg at 02/11/17 1523  . nicotine (NICODERM CQ - dosed in mg/24 hours) patch 21 mg  21 mg Transdermal Daily Aline August, MD   21 mg at 02/13/17 0937  . ondansetron (ZOFRAN) tablet 4 mg  4 mg Oral Q6H PRN Elwin Mocha, MD   4 mg at 02/11/17 1932   Or  . ondansetron (ZOFRAN) injection 4 mg  4 mg Intravenous Q6H PRN Elwin Mocha, MD        Musculoskeletal: Strength & Muscle Tone: decreased Gait & Station: unable to stand Patient leans: N/A  Psychiatric Specialty Exam: Physical Exam as per history and physical   ROS complaining about opioid and benzodiazepine withdrawal, cravings, sweating, shaking, tremulous, denied  shortness of breath and chest pain. No Fever-chills, No Headache, No changes with Vision or hearing, reports vertigo No problems swallowing food or Liquids, No Chest pain, Cough or Shortness of Breath, No Abdominal pain, No Nausea or Vommitting, Bowel movements are regular, No Blood in stool or Urine, No dysuria, No new skin rashes or bruises, No new joints pains-aches,  No new weakness, tingling, numbness in any extremity, No recent weight gain or loss, No polyuria, polydypsia or polyphagia,  A full 10 point Review of Systems was done, except as stated above, all other Review of Systems were negative.  Blood pressure (!) 100/48, pulse (!) 45, temperature 98.4 F (36.9 C), temperature source Oral, resp. rate 18, height 5' 5" (1.651 m), weight 70.5 kg (155 lb 6.8 oz), last menstrual period 01/15/2017, SpO2 100 %.Body mass index is 25.86 kg/m.  General Appearance: Disheveled and Guarded  Eye Contact:  Fair  Speech:  Blocked and Slurred  Volume:  Decreased  Mood:  Anxious and Depressed   Affect:  Depressed and Tearful  Thought Process:  Coherent  Orientation:  Full (Time, Place, and Person)  Thought Content:  Logical  Suicidal Thoughts:  she has intentional overdose of opiates and benzo but denied suicide ideations.  Homicidal Thoughts:  No  Memory:  Immediate;   Fair Recent;   Poor Remote;   Good  Judgement:  Impaired  Insight:  Shallow  Psychomotor Activity:  Decreased and Restlessness  Concentration:  Concentration: Fair and Attention Span: Poor  Recall:  Poor  Fund of Knowledge:  Fair  Language:  Good  Akathisia:  Negative  Handed:  Right  AIMS (if indicated):     Assets:  Communication Skills Desire for Improvement Housing Intimacy Leisure Time  Resilience Social Arts administrator  ADL's:  Impaired  Cognition:  Impaired,  Mild  Sleep:        Treatment Plan Summary: 26 years old female with a history of depression, anxiety and polysubstance abuse especially opiates and benzodiazepines and also smoking tetrahydrocannabinol presented with missing from action for more than 24 hours and found with altered mental status and local gas station. Patient has an intentional drug overdose secondary to conflict with her biological mother who had a history of drug addiction. Patient reported withdrawal symptoms from opiates and benzos.  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  Discontinue clonidine detox treatment as patient is able to do well without withdrawal symptoms and clonidine is causing decreased heart rate  Continue Librium detox treatment for benzodiazepines. Continue Fluoxetine 10 mg daily for depression and anxiety  Continue Benadryl 25 mg at bedtime as needed for insomnia  Referred to the CSW provide necessary outpatient referrals   Disposition: Patient will be referred to the [facility] [place] for  outpatient counseling services and medication management and medically stable and discharged Supportive therapy provided about ongoing stressors.  Ambrose Finland, MD 02/13/2017 3:13 PM

## 2017-02-14 DIAGNOSIS — T50904A Poisoning by unspecified drugs, medicaments and biological substances, undetermined, initial encounter: Secondary | ICD-10-CM

## 2017-02-14 DIAGNOSIS — Z720 Tobacco use: Secondary | ICD-10-CM

## 2017-02-14 DIAGNOSIS — R9431 Abnormal electrocardiogram [ECG] [EKG]: Secondary | ICD-10-CM

## 2017-02-14 DIAGNOSIS — F418 Other specified anxiety disorders: Secondary | ICD-10-CM

## 2017-02-14 DIAGNOSIS — R001 Bradycardia, unspecified: Secondary | ICD-10-CM

## 2017-02-14 DIAGNOSIS — G9340 Encephalopathy, unspecified: Secondary | ICD-10-CM

## 2017-02-14 LAB — BASIC METABOLIC PANEL
ANION GAP: 9 (ref 5–15)
BUN: 18 mg/dL (ref 6–20)
CALCIUM: 9.2 mg/dL (ref 8.9–10.3)
CHLORIDE: 112 mmol/L — AB (ref 101–111)
CO2: 22 mmol/L (ref 22–32)
CREATININE: 0.71 mg/dL (ref 0.44–1.00)
GFR calc non Af Amer: >60 mL/min (ref >60)
Glucose, Bld: 109 mg/dL — ABNORMAL HIGH (ref 65–99)
POTASSIUM: 3.2 mmol/L — AB (ref 3.5–5.1)
SODIUM: 143 mmol/L (ref 135–145)

## 2017-02-14 LAB — CBC WITH DIFFERENTIAL/PLATELET
BASOS PCT: 0 %
Basophils Absolute: 0 10*3/uL (ref 0.0–0.1)
EOS ABS: 0.1 10*3/uL (ref 0.0–0.7)
Eosinophils Relative: 1 %
HEMATOCRIT: 39.7 % (ref 36.0–46.0)
HEMOGLOBIN: 13.4 g/dL (ref 12.0–15.0)
Lymphocytes Relative: 27 %
Lymphs Abs: 2.9 10*3/uL (ref 0.7–4.0)
MCH: 32 pg (ref 26.0–34.0)
MCHC: 33.8 g/dL (ref 30.0–36.0)
MCV: 94.7 fL (ref 78.0–100.0)
Monocytes Absolute: 0.7 10*3/uL (ref 0.1–1.0)
Monocytes Relative: 7 %
NEUTROS ABS: 7.4 10*3/uL (ref 1.7–7.7)
NEUTROS PCT: 65 %
Platelets: 226 10*3/uL (ref 150–400)
RBC: 4.19 MIL/uL (ref 3.87–5.11)
RDW: 13 % (ref 11.5–15.5)
WBC: 11.1 10*3/uL — AB (ref 4.0–10.5)

## 2017-02-14 LAB — MAGNESIUM: MAGNESIUM: 1.9 mg/dL (ref 1.7–2.4)

## 2017-02-14 MED ORDER — FLUOXETINE HCL 10 MG PO CAPS: 10.0000 mg | ORAL_CAPSULE | Freq: Every day | ORAL | 0 refills | Status: DC

## 2017-02-14 MED ORDER — DIPHENHYDRAMINE HCL 25 MG PO CAPS: 25.0000 mg | ORAL_CAPSULE | Freq: Every evening | ORAL | 0 refills | Status: DC | PRN

## 2017-02-14 MED ORDER — CHLORDIAZEPOXIDE HCL 25 MG PO CAPS: 25.0000 mg | ORAL_CAPSULE | Freq: Every day | ORAL | 0 refills | Status: DC

## 2017-02-14 MED ORDER — POTASSIUM CHLORIDE CRYS ER 20 MEQ PO TBCR
40.0000 meq | EXTENDED_RELEASE_TABLET | Freq: Once | ORAL | Status: AC
  Administered 2017-02-14: 40 meq via ORAL
  Filled 2017-02-14: qty 2

## 2017-02-14 MED ORDER — NICOTINE 21 MG/24HR TD PT24: 21.0000 mg | MEDICATED_PATCH | Freq: Every day | TRANSDERMAL | 0 refills | Status: DC

## 2017-02-14 MED ORDER — ADULT MULTIVITAMIN W/MINERALS CH: 1.0000 | ORAL_TABLET | Freq: Every day | ORAL | 0 refills | Status: DC

## 2017-02-14 NOTE — Care Management Note (Signed)
Case Management Note  Patient Details  Name: Sharon [name] MRN: [id number] Date of Birth: 03-12-1991  Subjective/Objective:   Acute probable toxic encephalopathy, drug overdose                 Action/Plan: Discharge Planning: Spoke to pt and provided her with brochure to follow up at [facility] on 02/21/2017 at 1:30 pm. Consult to CSW  for follow up on outpatient drug abuse counseling and Financial Counselor referral for assistance with completing Medicaid application. Pt can utilize pharmacy to get meds at a discount rate. Pt lives at home with her parents. 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 Has transportation to her appts.    Expected Discharge Date:  02/14/17               Expected Discharge Plan:  Home/Self Care  In-House Referral:  NA, Financial Counselor  Discharge planning Services  CM Consult, Follow-up appt scheduled  Post Acute Care Choice:  NA Choice offered to:  NA  DME Arranged:  N/A DME Agency:  NA  HH Arranged:  NA HH Agency:  NA  Status of Service:  Completed, signed off  If discussed at Long Length of Stay Meetings, dates discussed:    Additional Comments:  Elliot Cousin, RN 02/14/2017, 11:11 AM

## 2017-02-14 NOTE — Progress Notes (Signed)
Pt given discharge instructions, prescriptions, and care notes. Pt verbalized understanding AEB no further questions or concerns at this time. IV was discontinued, no redness, pain, or swelling noted at this time. Telemetry discontinued and Centralized Telemetry was notified. Pt left the floor via wheelchair with staff in stable condition. 

## 2017-02-14 NOTE — Discharge Summary (Signed)
Physician Discharge Summary  Sharon [name] RUE:[id number] DOB: 27-Nov-1990 DOA: 02/10/2017  PCP: Patient, No Pcp Per  Admit date: 02/10/2017 Discharge date: 02/14/2017  Time spent: 45 minutes  Recommendations for Outpatient Follow-up:  Patient will be discharged to home.  Patient will need to follow up with primary care provider within one week of discharge. Follow up with [facility]. Patient should continue medications as prescribed.  Patient should follow a regular diet.   Discharge Diagnoses:  Acute encephalopathy, toxic Drug overdose Sinus bradycardia/prolonged QT Anxiety/depression Pyuria  Discharge Condition: Stable   Diet recommendation: Regular  Filed Weights   02/10/17 2047 02/13/17 0424 02/14/17 0203  Weight: 70.8 kg (156 lb 1.6 oz) 70.5 kg (155 lb 6.8 oz) 68.8 kg (151 lb 11.2 oz)    History of present illness:  On 02/10/2017 by Dr. Eliezer Bottom [name] is a 26 y.o. female  past medical history of headaches, carpal tunnel, arthritis, anxiety and substance abuse presents emergency room with decreased level of consciousness. Patient was found by police in her car with the number of pills at her side. She was difficult to arouse. EMS activated. Patient to the emergency room very drowsy. Maintaining her O2 saturation. UDS was positive for illegal substances.  Hospital Course:  Acute encephalopathy, toxic -Urine drug screen +opidates, benzodiazepine, THC -Resolved  Drug overdose -Per patient, unintentional -Was placed on withdrawal protocol with clonidine and librium  -Clonidine discontinued due to bradycardia -Psychiatry consulted and appreciated: recommended continue librium for detox, fluoxetine 10mg  daily for depression/anxiety, benadryl 25mg  QHS PRN insomnia  -Social work and care management consulted to provide information -patient to follow up with [place] [facility] -Will discharge patient with 2 doses of librium to complete  course  Sinus bradycardia/prolonged QT -Currently asymptomatic -Suspect secondary to clonidine -Echocardiogram showed an EF of 55-60% -Potassium 3.2, replaced -Magnesium WNL  Anxiety/depression -Continue Prozac -Patient to follow-up with [place] [facility]  Pyuria -UA unremarkable for infection -no antibiotics   Tobacco abuse -Continue nicotine patch  Procedures: None  Consultations: Psychiatry   Discharge Exam: Vitals:   02/13/17 2159 02/14/17 0511  BP: 110/60 110/62  Pulse: (!) 48 (!) 51  Resp: 17 17  Temp: 98.1 F (36.7 C) 98.4 F (36.9 C)  SpO2: 100% 100%   Patient feeling better today and would like to go home. Denies chest pain, shortness of breath, abdominal pain, nausea vomiting, diarrhea constipation, dizziness or headache, sweating. Denies hallucinations.   General: Well developed, well nourished, NAD, appears stated age  HEENT: NCAT, mucous membranes moist.  Cardiovascular: S1 S2 auscultated, bradycardic, no murmurs  Respiratory: Clear to auscultation bilaterally with equal chest rise  Abdomen: Soft, nontender, nondistended, + bowel sounds  Extremities: warm dry without cyanosis clubbing or edema  Neuro: AAOx3, nonfocal   Psych: Appropriate mood and affect, pleasant  Discharge Instructions Discharge Instructions    Discharge instructions    Complete by:  As directed    Patient will be discharged to home.  Patient will need to follow up with primary care provider within one week of discharge. Follow up with [facility]. Patient should continue medications as prescribed.  Patient should follow a regular diet.     Current Discharge Medication List    START taking these medications   Details  chlordiazePOXIDE (LIBRIUM) 25 MG capsule Take 1 capsule (25 mg total) by mouth daily. Qty: 2 capsule, Refills: 0    diphenhydrAMINE (BENADRYL) 25 mg capsule Take 1 capsule (25 mg total) by  mouth at bedtime as needed for sleep. Qty:  30 capsule, Refills: 0    FLUoxetine (PROZAC) 10 MG capsule Take 1 capsule (10 mg total) by mouth daily. Qty: 30 capsule, Refills: 0    Multiple Vitamin (MULTIVITAMIN WITH MINERALS) TABS tablet Take 1 tablet by mouth daily. Qty: 30 tablet, Refills: 0    nicotine (NICODERM CQ - DOSED IN MG/24 HOURS) 21 mg/24hr patch Place 1 patch (21 mg total) onto the skin daily. Qty: 28 patch, Refills: 0      STOP taking these medications     ibuprofen (ADVIL,MOTRIN) 800 MG tablet      Norethindrone Acetate-Ethinyl Estrad-FE (LOESTRIN 24 FE) 1-20 MG-MCG(24) tablet      varenicline (CHANTIX CONTINUING MONTH PAK) 1 MG tablet      varenicline (CHANTIX STARTING MONTH PAK) 0.5 MG X 11 & 1 MG X 42 tablet        No Known Allergies Follow-up Information    [facility] Follow up.   Why:  appointment scheduled for 02/21/2017 at 1:30 pm- please call if you cannot keep this appointment to reschedule.  Contact information: [address] [place] [address] [phone]       [facility] Schedule an appointment as soon as possible for a visit in 1 week(s).   Specialty:  Professional Counselor Why:  Hospital follow up Contact information: [address] [place] [address] [phone]            The results of significant diagnostics from this hospitalization (including imaging, microbiology, ancillary and laboratory) are listed below for reference.    Significant Diagnostic Studies: Ct Head Wo Contrast  Result Date: 02/10/2017 CLINICAL DATA:  Patient found unconscious. EXAM: CT HEAD WITHOUT CONTRAST TECHNIQUE: Contiguous axial images were obtained from the base of the skull through the vertex without intravenous contrast. COMPARISON:  None. FINDINGS: Brain: No evidence of acute infarction, hemorrhage, hydrocephalus, extra-axial collection or mass lesion/mass effect. Vascular: No hyperdense vessel or  unexpected calcification. Skull: Normal. Negative for fracture or focal lesion. Sinuses/Orbits: There is debris in the left sphenoid sinus with mucosal thickening. Paranasal sinuses, mastoid air cells, and middle ears are otherwise normal. Other: None. IMPRESSION: 1. Mild sinus disease. No acute intracranial abnormality identified. Electronically Signed   By: Gerome Sam III M.D   On: 02/10/2017 16:37    Microbiology: No results found for this or any previous visit (from the past 240 hour(s)).   Labs: Basic Metabolic Panel:  Recent Labs Lab 02/10/17 0213 02/10/17 1824 02/11/17 0542 02/12/17 0316 02/13/17 0554 02/14/17 0456  NA 140  --  143 143 143 143  K 3.8  --  3.6 3.8 3.7 3.2*  CL 105  --  109 111 113* 112*  CO2 28  --  32 27 22 22   GLUCOSE 138*  --  92 123* 108* 109*  BUN 9  --  9 14 15 18   CREATININE 0.59  --  0.57 0.69 0.65 0.71  CALCIUM 8.8*  --  8.7* 8.6* 8.8* 9.2  MG  --  2.1  --  2.0 2.1 1.9  PHOS  --  4.1  --   --   --   --    Liver Function Tests:  Recent Labs Lab 02/10/17 0213 02/11/17 0542 02/12/17 0316  AST 22 16 42*  ALT 12* 13* 29  ALKPHOS 47 45 48  BILITOT 0.5 1.1 0.9  PROT 6.9 6.3* 6.3*  ALBUMIN  4.0 3.3* 3.4*   No results for input(s): LIPASE, AMYLASE in the last 168 hours. No results for input(s): AMMONIA in the last 168 hours. CBC:  Recent Labs Lab 02/10/17 0213 02/11/17 0542 02/12/17 0316 02/13/17 0554 02/14/17 0456  WBC 5.0 6.4 8.4 8.2 11.1*  NEUTROABS 2.6  --  4.7 4.9 7.4  HGB 12.1 11.8* 11.6* 12.3 13.4  HCT 37.3 37.0 36.9 37.8 39.7  MCV 97.9 97.1 96.6 95.5 94.7  PLT 183 181 168 218 226   Cardiac Enzymes: No results for input(s): CKTOTAL, CKMB, CKMBINDEX, TROPONINI in the last 168 hours. BNP: BNP (last 3 results) No results for input(s): BNP in the last 8760 hours.  ProBNP (last 3 results) No results for input(s): PROBNP in the last 8760 hours.  CBG:  Recent Labs Lab 02/10/17 0211 02/10/17 1341 02/10/17 1555    GLUCAP 138* 66 79       Signed:  Merriam Brandner  [facility] 02/14/2017, 11:15 AM

## 2017-02-14 NOTE — Progress Notes (Signed)
CSW received consult regarding SA/psych resources. Patient stated that she will probably follow up with [facility] but accepted a list of other facilities as well. She refused SA resources but did ask about help with insurance. She stated she has family medicaid for her son but was told she made too much money to get medicaid for herself. CSW advised her to contact DSS again. Patient also stated that hospital lost her jeans and work apron which had her tip money in it. Her mother has contacted security to see if they can assist.  CSW signing off.   Osborne Casco Ganon Demasi LCSWA [phone]

## 2017-02-14 NOTE — Discharge Instructions (Signed)
Drug Overdose °A drug overdose happens when you take too much of a drug. An overdose can occur with illegal drugs, prescription drugs, or over-the-counter (OTC) drugs. °The effects of a drug overdose can be mild, dangerous, or even deadly. °What are the causes? °This condition may be caused by: °· Taking too much of a drug by accident. °· Taking too much of a drug on purpose. °· An error made by a health care provider who prescribes a drug. °· An error made by the pharmacist who fills the prescription order. ° °Drugs that commonly cause overdose include: °· Mental health drugs. °· Pain medicines. °· Illegal drugs. °· OTC cough and cold medicines. °· Heart medicines. °· Seizure medicines. ° °What increases the risk? °A drug overdose is more likely in: °· Children. They may be attracted to colorful pills. Because of a child's small size, even a small amount of a drug can be dangerous. °· Elderly people. They may be taking many different drugs. Elderly people may have difficulty reading labels or remembering when they last took their medicine. ° °The risk of a drug overdose is also higher for someone who: °· Takes illegal drugs. °· Takes a drug and drinks alcohol. °· Has a mental health condition. ° °What are the signs or symptoms? °Symptoms of a drug overdose depend on the drug and the amount that was taken. Common danger signs include: °· Behavior changes. °· Sleepiness. °· Slowed breathing. °· Nausea and vomiting. °· Seizures. °· Changes in eye pupil size. The pupil may be very large or very small. ° °If there are signs and symptoms of very low blood pressure (shock) from an overdose, emergency treatment is required. These include: °· Cold and clammy skin. °· Pale skin. °· Blue lips. °· Very slow breathing. °· Extreme sleepiness. °· Loss of consciousness. ° °How is this diagnosed? °This condition may be diagnosed based on your symptoms. It is important to tell your health care provider: °· All of the drugs that you  took. °· When you took the drugs. °· Whether you were drinking alcohol. ° °Your health care provider will do a physical exam. This exam may include: °· Checking and monitoring your heart rate and rhythm, your temperature, and your blood pressure (vital signs). °· Checking your breathing and oxygen level. ° °You may also have tests, including: °· Urine tests to check for drugs in your system. °· Blood tests to check for: °? Drugs in your system. °? Signs of an imbalance of your blood minerals (electrolytes). °? Liver damage. °? Kidney damage. ° °How is this treated? °Supporting your vital signs and your breathing is the first step in treating a drug overdose. Treatment may also include: °· Giving fluids and electrolytes through an IV tube. °· Inserting a breathing tube (endotracheal tube) in your airway to help you breathe. °· Passing a tube through your nose and into your stomach (NG tube, or nasogastric tube) to wash out your stomach. °· Giving medicines that: °? Make you vomit. °? Absorb any medicine that is left in your digestive system. °? Block or reverse the effect of the drug that caused the overdose. °· Filtering your blood through an artificial kidney machine (hemodialysis). You may need this if your overdose is severe or if you have kidney failure. °· Ongoing counseling and mental health support if you intentionally overdosed or used an illegal drug. ° °Follow these instructions at home: °· Take medicines only as directed by your health care provider. Always ask   your health care provider about possible side effects of any new drug that you start taking. °· Keep a list of all of the drugs that you take, including over-the-counter medicines. Bring this list with you to all of your medical visits. °· Drink enough fluid to keep your urine clear or pale yellow. °· Keep all follow-up visits as directed by your health care provider. This is important. °How is this prevented? °· Get help if you are struggling  with: °? Alcohol or drug use. °? Depression or another mental health problem. °· Keep the phone number of your local poison control center near your phone or on your cell phone. °· Store all medicines in safety containers that are out of the reach of children. °· Read the drug inserts that come with your medicines. °· Do not use illegal drugs. °· Do not drink alcohol when taking drugs. °· Do not take medicines that are not prescribed for you. °Contact a health care provider if: °· Your symptoms return. °· You develop new symptoms or side effects when you take medicines. °Get help right away if: °· You think that you or someone else may have taken too much of a drug. The hotline of the National Poison Control Center is (800) 222-1222. °· You or someone else is having symptoms of a drug overdose. °· You have serious thoughts about hurting yourself or others. °· You become confused. °· You have: °? Chest pain. °? Difficulty breathing. °? A loss of consciousness. °Drug overdose is an emergency. Do not wait to see if the symptoms will go away. Get medical help right away. Call your local emergency services (911 in the [place] Do not drive yourself to the hospital. °This information is not intended to replace advice given to you by your health care provider. Make sure you discuss any questions you have with your health care provider. °Document Released: 10/27/2014 Document Revised: 10/22/2015 Document Reviewed: 06/17/2014 °Elsevier Interactive Patient Education © 2018 [employer] ° °

## 2017-02-21 ENCOUNTER — Inpatient Hospital Stay: Payer: Medicaid Other

## 2017-02-21 NOTE — Progress Notes (Deleted)
Patient ID: Sharon [name], female   DOB: 08/06/1990, 26 y.o.   MRN: [id number] After being hospitalized 8/18-8/22/2018 for acute encephalopathy with drug overdose.    Discharge summary: History of present illness:  On 02/10/2017 by Dr. Casimer LaniusPhillip Hobbs Raphael A Bottsis a 26 y.o.femalepast medical history of headaches, carpal tunnel, arthritis, anxiety and substance abuse presents emergency room with decreased level of consciousness. Patient was found by police in her car with the number of pills at her side. She was difficult to arouse. EMS activated. Patient to the emergency room very drowsy. Maintaining her O2 saturation. UDS was positive for illegal substances.  Hospital Course:  Acute encephalopathy, toxic -Urine drug screen +opidates, benzodiazepine, THC -Resolved  Drug overdose -Per patient, unintentional -Was placed on withdrawal protocol with clonidine and librium  -Clonidine discontinued due to bradycardia -Psychiatry consulted and appreciated: recommended continue librium for detox, fluoxetine 10mg  daily for depression/anxiety, benadryl 25mg  QHS PRN insomnia  -Social work and care management consulted to provide information -patient to [facility] -Will discharge patient with 2 doses of librium to complete course  Sinus bradycardia/prolonged QT -Currently asymptomatic -Suspect secondary to clonidine -Echocardiogram showed an EF of 55-60% -Potassium 3.2, replaced -Magnesium WNL  Anxiety/depression -Continue Prozac -Patient to [facility]  Pyuria -UA unremarkable for infection -no antibiotics   Tobacco abuse -Continue nicotine patch

## 2017-08-30 ENCOUNTER — Emergency Department (HOSPITAL_COMMUNITY): Payer: Managed Care, Other (non HMO)

## 2017-08-30 ENCOUNTER — Other Ambulatory Visit: Payer: Self-pay

## 2017-08-30 ENCOUNTER — Encounter (HOSPITAL_COMMUNITY): Payer: Self-pay

## 2017-08-30 ENCOUNTER — Emergency Department (HOSPITAL_COMMUNITY)
Admission: EM | Admit: 2017-08-30 | Discharge: 2017-08-31 | Disposition: A | Discharge status: Home / Self Care | Payer: Managed Care, Other (non HMO) | Attending: Emergency Medicine | Admitting: Emergency Medicine

## 2017-08-30 DIAGNOSIS — R569 Unspecified convulsions: Secondary | ICD-10-CM

## 2017-08-30 DIAGNOSIS — R112 Nausea with vomiting, unspecified: Secondary | ICD-10-CM | POA: Insufficient documentation

## 2017-08-30 DIAGNOSIS — R2231 Localized swelling, mass and lump, right upper limb: Secondary | ICD-10-CM | POA: Insufficient documentation

## 2017-08-30 DIAGNOSIS — F17210 Nicotine dependence, cigarettes, uncomplicated: Secondary | ICD-10-CM | POA: Insufficient documentation

## 2017-08-30 DIAGNOSIS — Z79899 Other long term (current) drug therapy: Secondary | ICD-10-CM | POA: Insufficient documentation

## 2017-08-30 DIAGNOSIS — M25521 Pain in right elbow: Secondary | ICD-10-CM | POA: Insufficient documentation

## 2017-08-30 LAB — BASIC METABOLIC PANEL
Anion gap: 12 (ref 5–15)
BUN: 10 mg/dL (ref 6–20)
CALCIUM: 9.5 mg/dL (ref 8.9–10.3)
CHLORIDE: 99 mmol/L — AB (ref 101–111)
CO2: 24 mmol/L (ref 22–32)
CREATININE: 0.7 mg/dL (ref 0.44–1.00)
GFR calc Af Amer: >60 mL/min (ref >60)
GFR calc non Af Amer: >60 mL/min (ref >60)
Glucose, Bld: 102 mg/dL — ABNORMAL HIGH (ref 65–99)
Potassium: 3.9 mmol/L (ref 3.5–5.1)
Sodium: 135 mmol/L (ref 135–145)

## 2017-08-30 LAB — CBC
HCT: 41 % (ref 36.0–46.0)
Hemoglobin: 13.9 g/dL (ref 12.0–15.0)
MCH: 31.7 pg (ref 26.0–34.0)
MCHC: 33.9 g/dL (ref 30.0–36.0)
MCV: 93.6 fL (ref 78.0–100.0)
Platelets: 228 10*3/uL (ref 150–400)
RBC: 4.38 MIL/uL (ref 3.87–5.11)
RDW: 13.4 % (ref 11.5–15.5)
WBC: 10.9 10*3/uL — ABNORMAL HIGH (ref 4.0–10.5)

## 2017-08-30 LAB — I-STAT BETA HCG BLOOD, ED (MC, WL, AP ONLY)

## 2017-08-30 LAB — ETHANOL

## 2017-08-30 IMAGING — CT CT HEAD W/O CM
3 of 4 series · 13 of 47 positions shown, 15 images · non-contrast
Comparison: Head CT [DATE]

CLINICAL DATA: Seizure, new, nontraumatic, 18-40 yrs. Seizure or
today.

EXAM:
CT HEAD WITHOUT CONTRAST
TECHNIQUE: Contiguous axial images were obtained from the base of the skull
through the vertex without intravenous contrast.

[Series 3: head wo · axial · 0.39mm/px · z∈[-92,+28]mm · 7 of 33 slices shown, 9 images]
[im 5/33  brain]
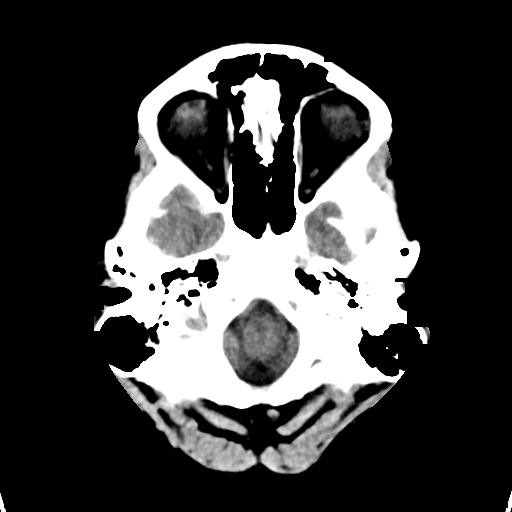
[im 5/33  bone]
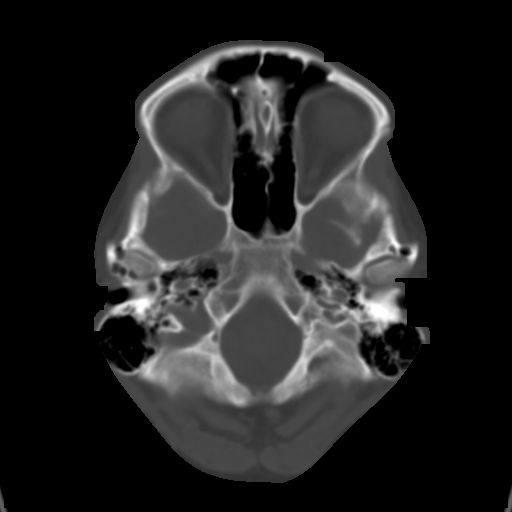
[im 9/33  brain]
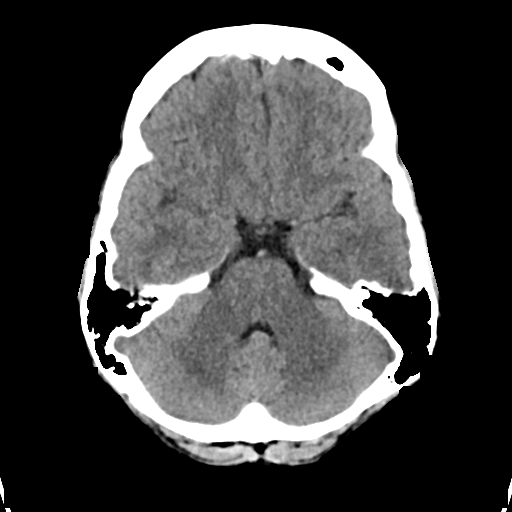
[im 13/33  brain]
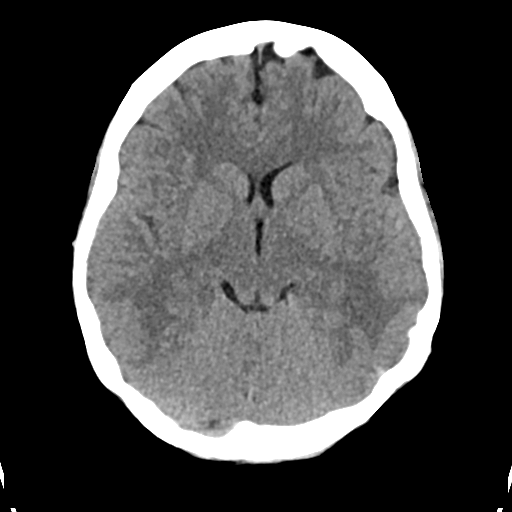
[im 17/33  brain]
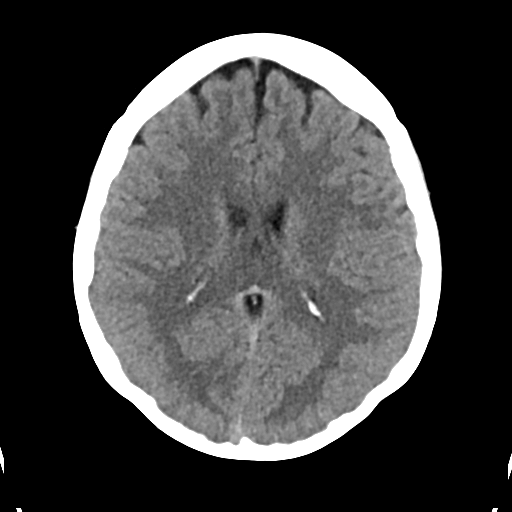
[im 21/33  brain]
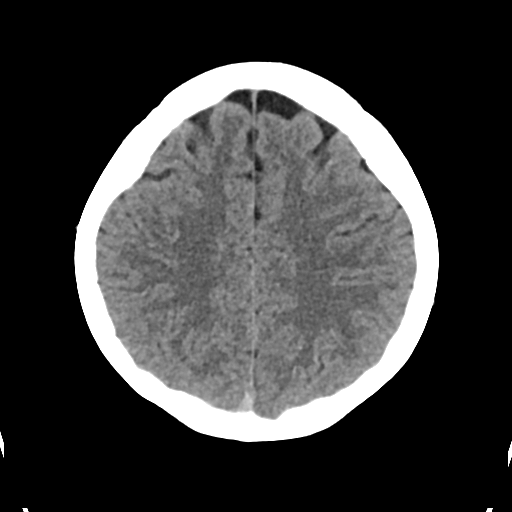
[im 21/33  bone]
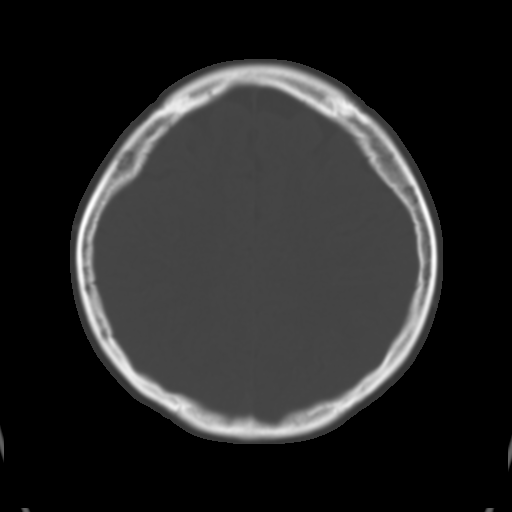
[im 25/33  brain]
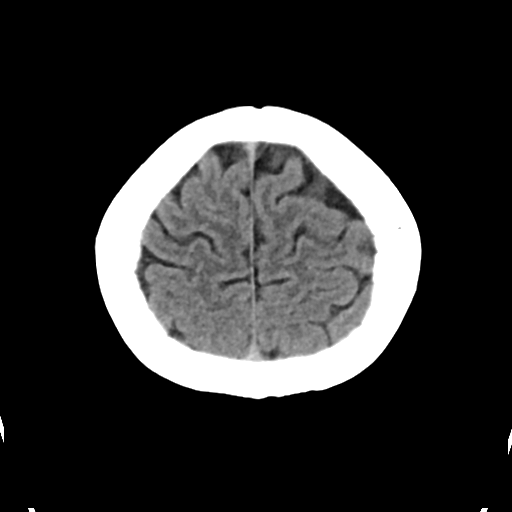
[im 29/33  brain]
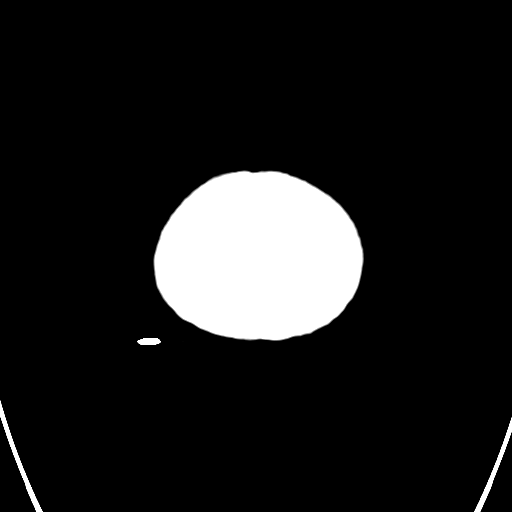

[Series 5: cor soft · coronal · 0.31mm/px · 3 of 67 slices shown]
[im 23/67  brain]
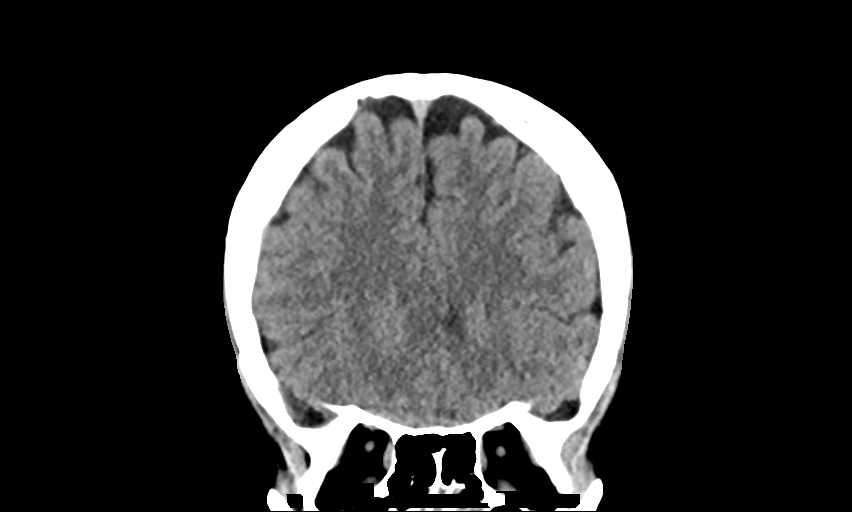
[im 30/67  brain]
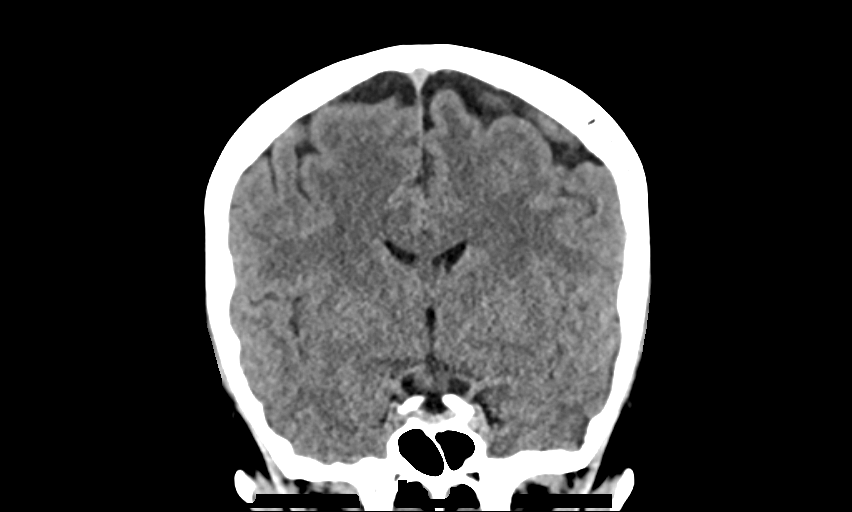
[im 37/67  brain]
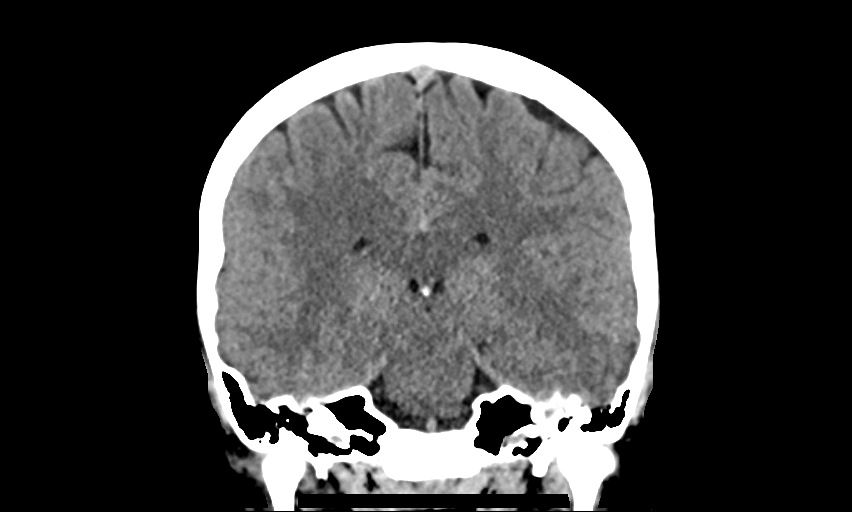

[Series 6: sag soft · sagittal · 0.31mm/px · 3 of 67 slices shown]
[im 23/67  brain]
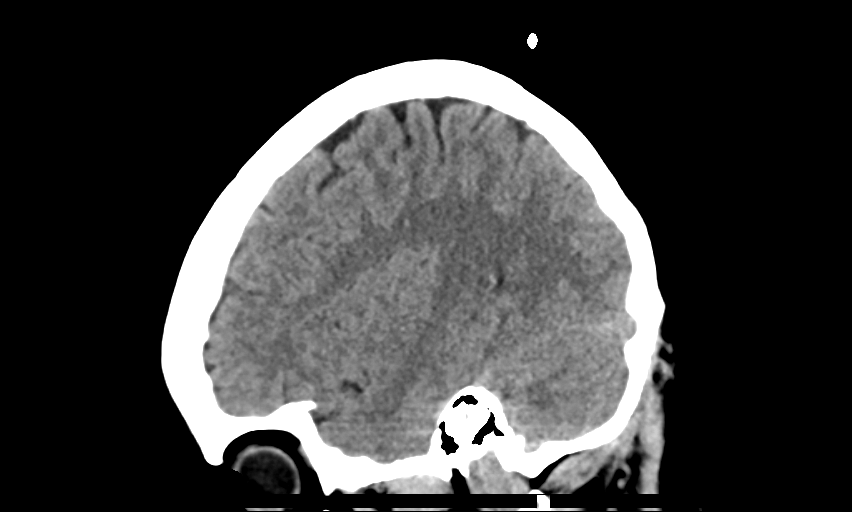
[im 34/67  brain]
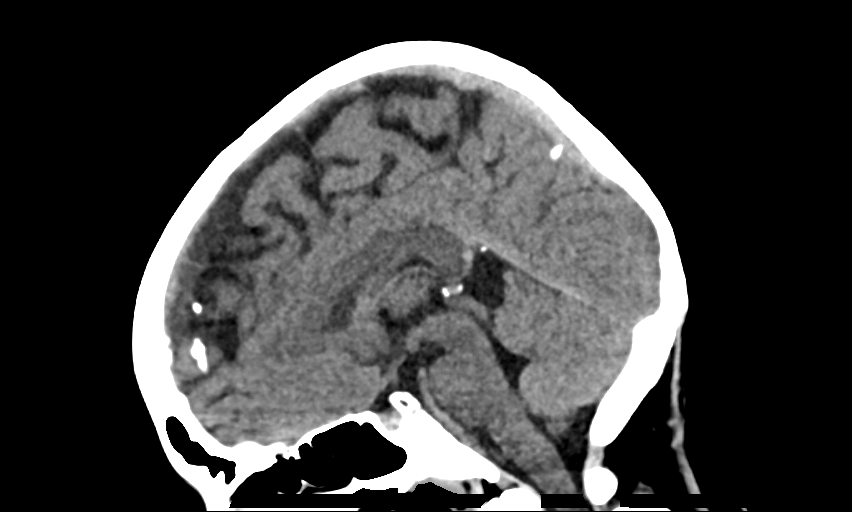
[im 45/67  brain]
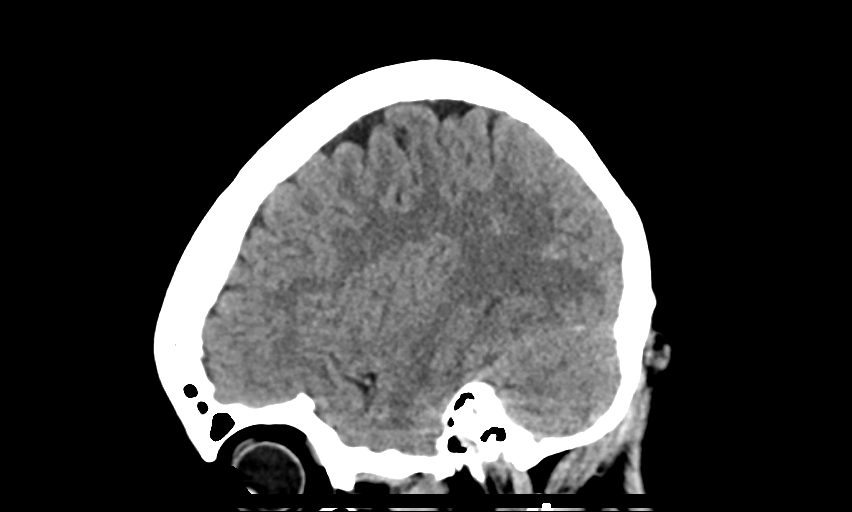

[13 of 47 positions shown; findings below may reference images not displayed]

FINDINGS: Brain: No intracranial hemorrhage, mass effect, or midline shift. No
hydrocephalus. The basilar cisterns are patent. No evidence of
territorial infarct or acute ischemia. No extra-axial or
intracranial fluid collection.

Vascular: No hyperdense vessel or unexpected calcification.

Skull: No fracture or focal lesion.

Sinuses/Orbits: Chronic mucosal thickening of the ethmoid air cells
and left sigmoid sinus, with slight improvement from prior exam.
Mastoid air cells are clear. Visualized orbits are unremarkable.

Other: None.
IMPRESSION: 1.  No acute intracranial abnormality.
2. Chronic paranasal sinus disease with mild improvement from prior
exam.

## 2017-08-30 IMAGING — CR DG ELBOW COMPLETE 3+V*R*
4 series · 4 of 4 positions shown · non-contrast
Comparison: None.

CLINICAL DATA: Right elbow pain after fall, seizure.

EXAM:
RIGHT ELBOW - COMPLETE 3+ VIEW

[elbow ap]
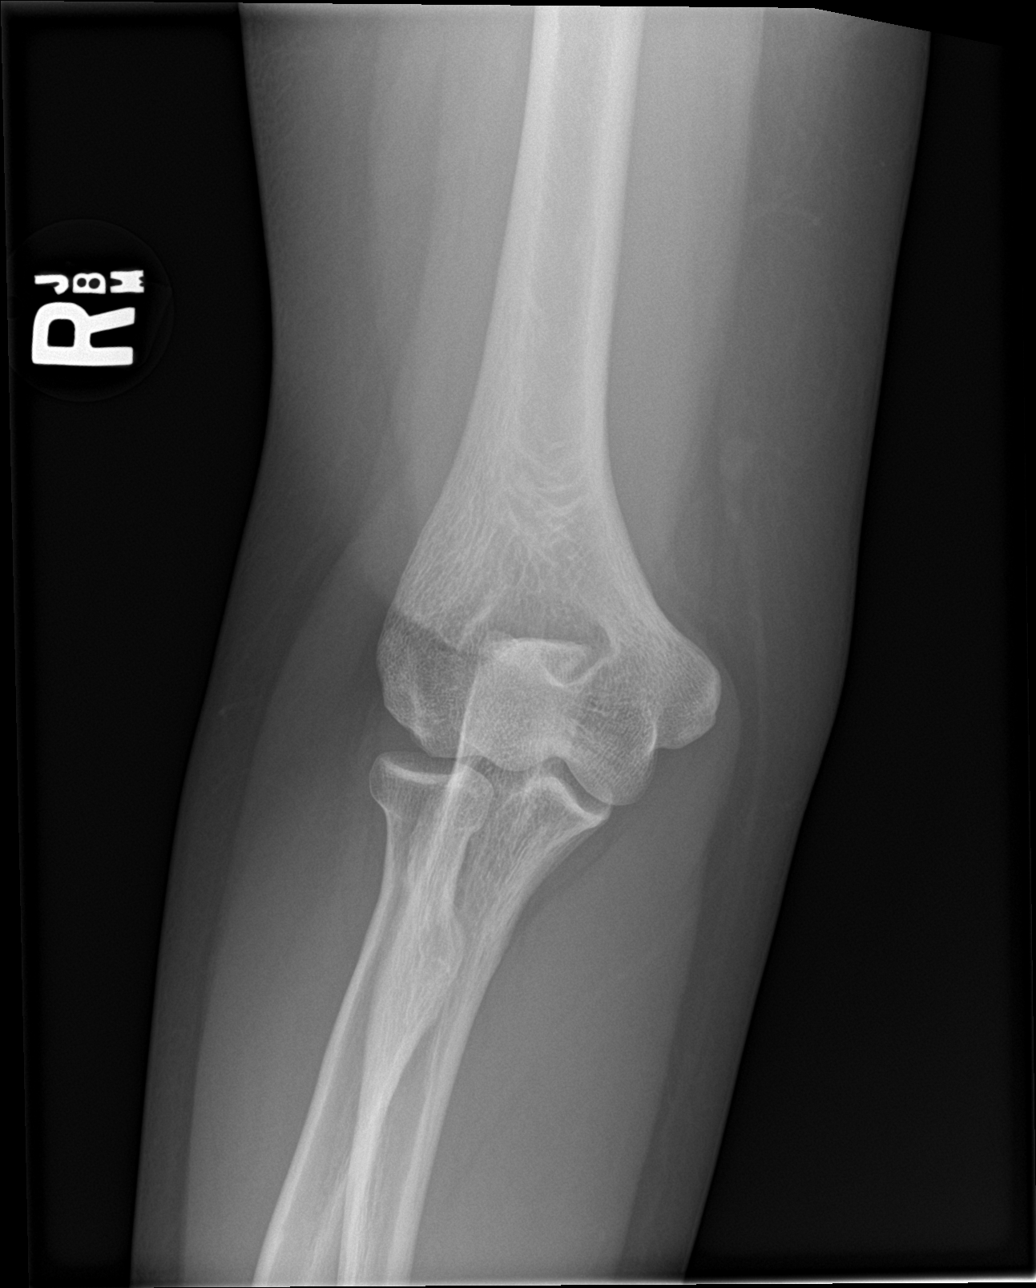

[elbow obl (1 of 2)]
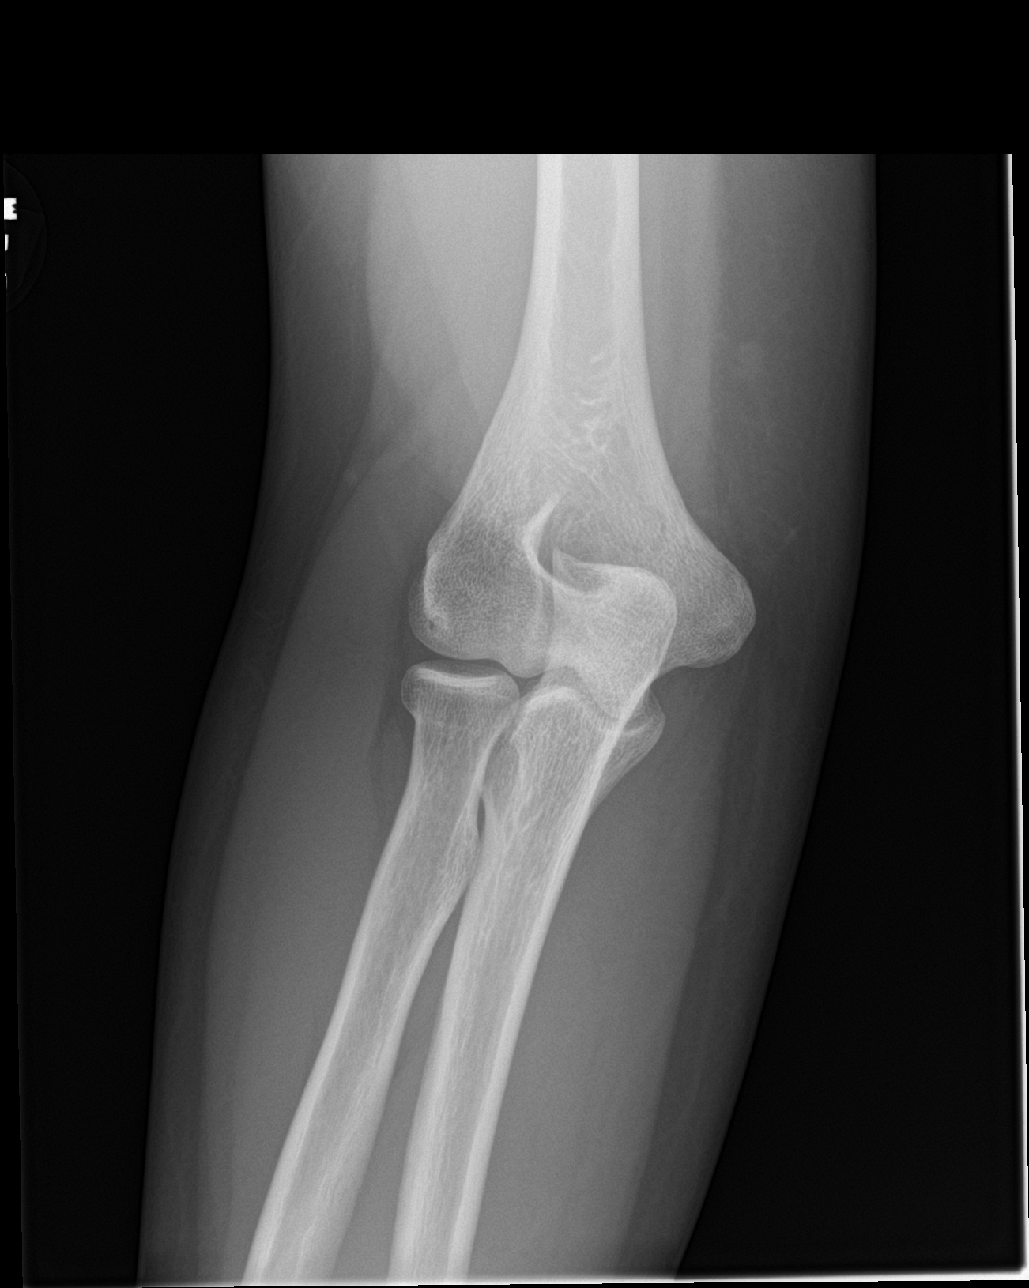

[elbow obl (2 of 2)]
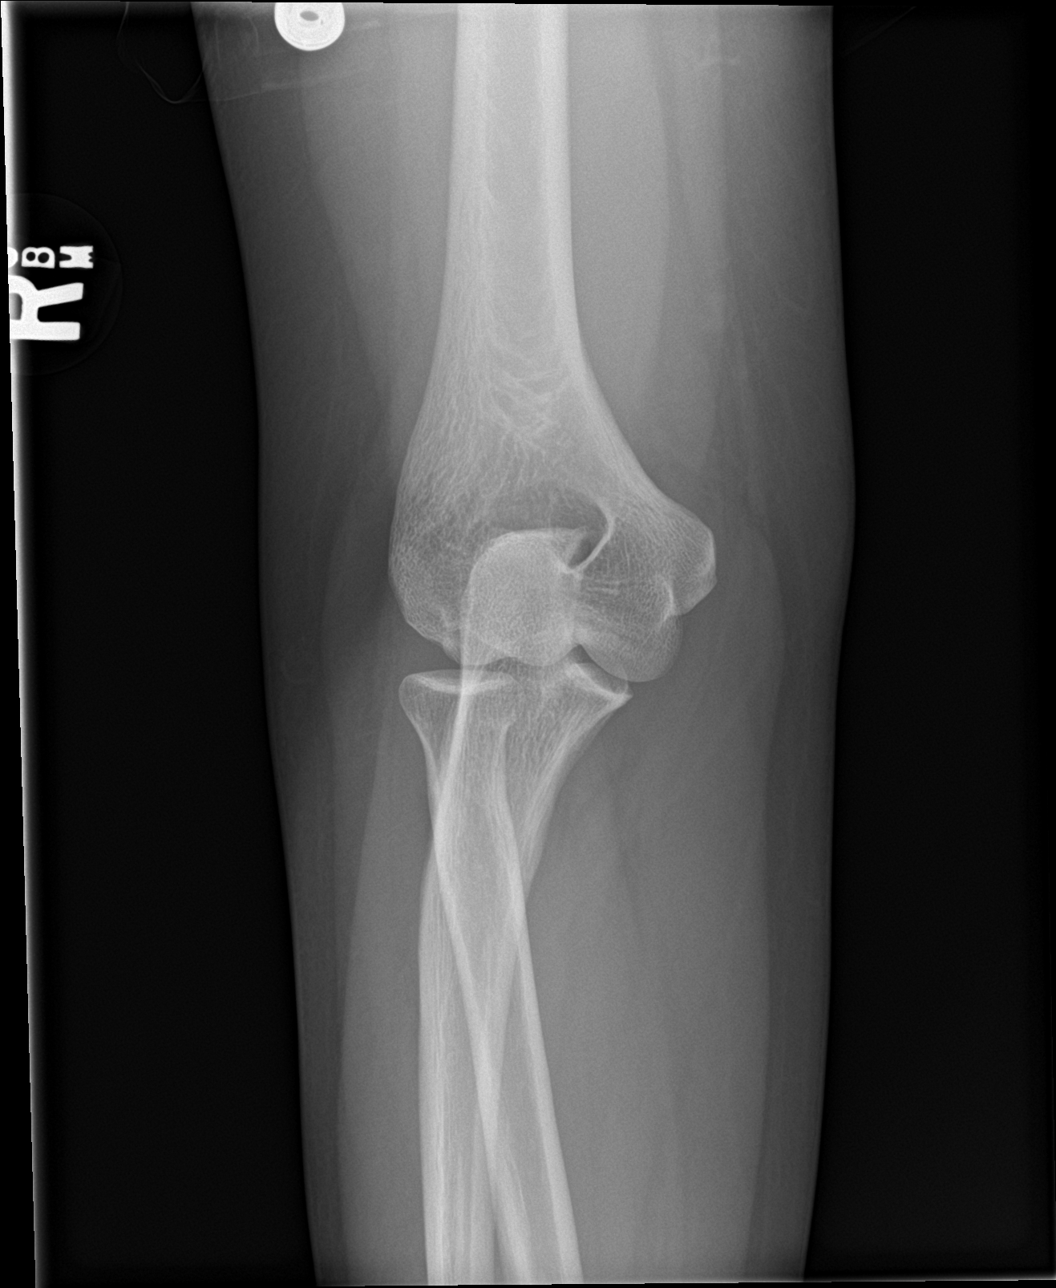

[elbow lat]
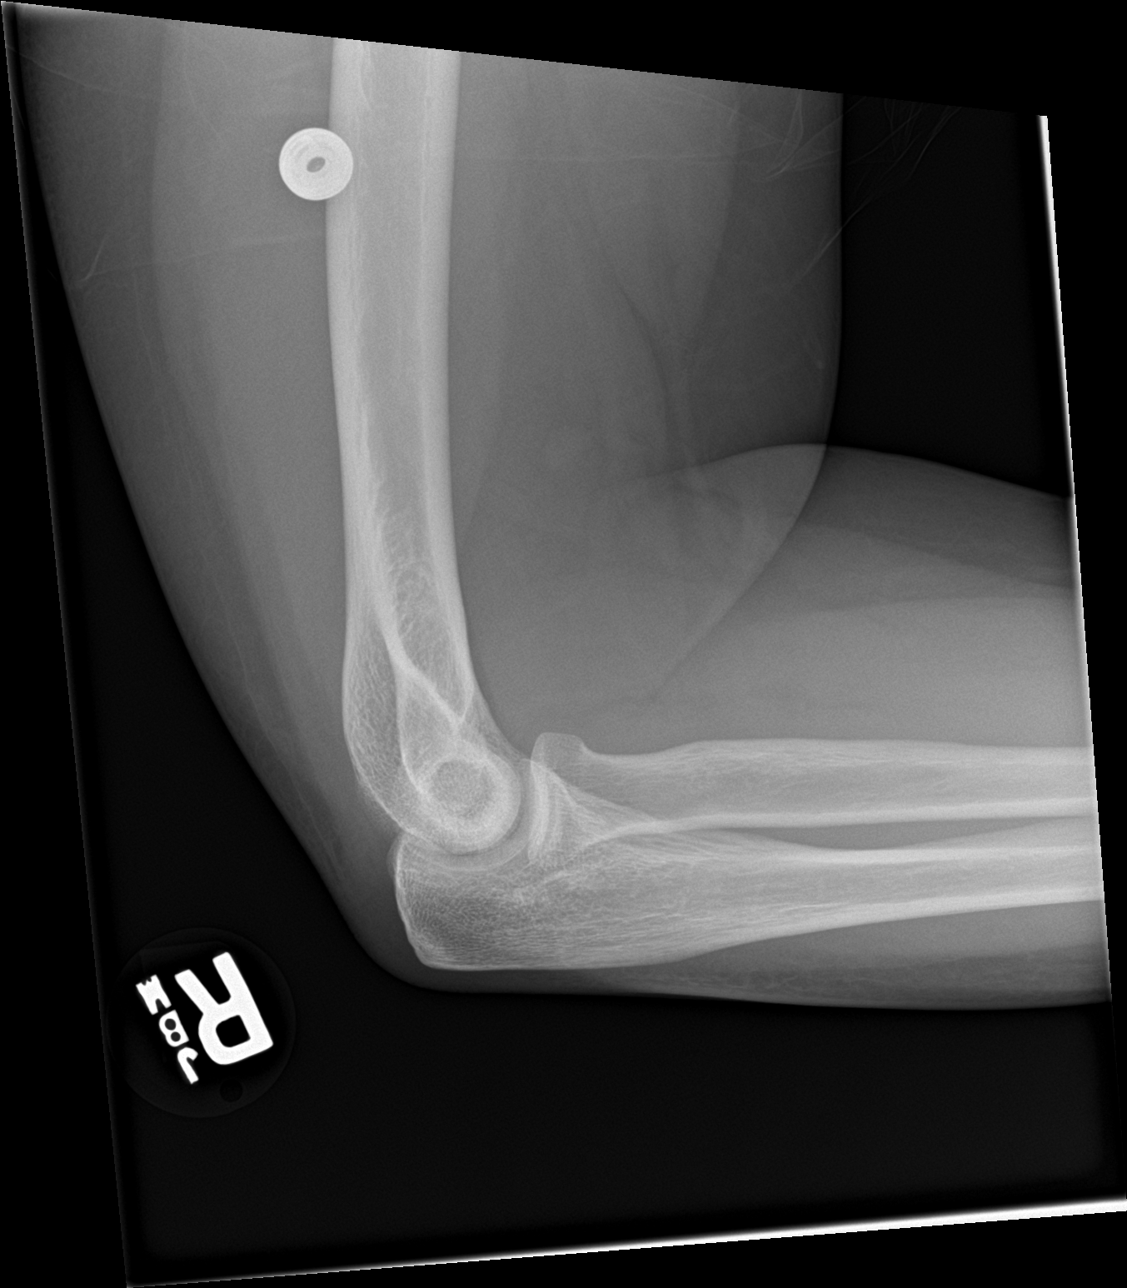

[4 of 4 positions shown; findings below may reference images not displayed]

FINDINGS: There is no evidence of fracture, dislocation, or joint effusion.
There is no evidence of arthropathy or other focal bone abnormality.
Soft tissues are unremarkable.
IMPRESSION: Negative radiographs of the right elbow.

## 2017-08-30 NOTE — ED Provider Notes (Signed)
[facility] EMERGENCY DEPARTMENT Provider Note   CSN: [id number] Arrival date & time: 08/30/17  2052     History   Chief Complaint No chief complaint on file.   HPI Sharon [name] is a 27 y.o. female.  Patient with history of substance abuse including opioids and benzos, prolonged qtc --presents with new onset seizure.  Patient was at her job tonight when she started seeing a white light and then woke up in an ambulance.  Per coworkers, she was reported to have 5-minute tonic-clonic seizure.  EMS was called.  No tongue biting or incontinence.  Mother arrived while patient was in the ambulance and she was noted to be very confused.  Patient was transported to the hospital.  Patient does report having 3-4 days of nausea and vomiting without diarrhea.  She states that we she will occasionally take a Percocet for benzodiazepine, but denies recent heavy use with abrupt withdrawal.  She denies alcohol use.  She denies use of tramadol.  No recent head injury.  No fevers or neck pain.  No weakness, numbness, or tingling in her arms or legs.  The onset of this condition was acute. The course is resolving. Aggravating factors: none. Alleviating factors: none.        Past Medical History:  Diagnosis Date  . Anxiety    no meds  . Arthritis    right lower arm tendonitis  . Carpal tunnel syndrome   . Headache(784.0)    otc med prn    Patient Active Problem List   Diagnosis Date Noted  . Acute encephalopathy 02/10/2017  . Previous cesarean delivery, delivered, with or without mention of antepartum condition 05/17/2012    Past Surgical History:  Procedure Laterality Date  . CESAREAN SECTION  05/16/2012   Procedure: CESAREAN SECTION;  Surgeon: Brock Bad, MD;  Location: [facility];  Service: Obstetrics;  Laterality: N/A;  Primary  . FOOT SURGERY  1997   removal foreign body (sewing needle)  . TOOTH EXTRACTION  2008   upper incisor x2    OB History    Gravida  Para Term Preterm AB Living   1 1 1     1    SAB TAB Ectopic Multiple Live Births           1      Obstetric Comments   1 st pregnancy , unplanned       Home Medications    Prior to Admission medications   Medication Sig Start Date End Date Taking? Authorizing Provider  chlordiazePOXIDE (LIBRIUM) 25 MG capsule Take 1 capsule (25 mg total) by mouth daily. 02/14/17   Edsel Petrin, DO  diphenhydrAMINE (BENADRYL) 25 mg capsule Take 1 capsule (25 mg total) by mouth at bedtime as needed for sleep. 02/14/17   Edsel Petrin, DO  FLUoxetine (PROZAC) 10 MG capsule Take 1 capsule (10 mg total) by mouth daily. 02/15/17   Edsel Petrin, DO  Multiple Vitamin (MULTIVITAMIN WITH MINERALS) TABS tablet Take 1 tablet by mouth daily. 02/15/17   Mikhail, Nita Sells, DO  nicotine (NICODERM CQ - DOSED IN MG/24 HOURS) 21 mg/24hr patch Place 1 patch (21 mg total) onto the skin daily. 02/15/17   Edsel Petrin, DO    Family History Family History  Problem Relation Age of Onset  . Seizures Father   . Diabetes Other   . CAD Other   . Hypertension Other   . Anesthesia problems Neg Hx   . Hypotension Neg Hx   .  Malignant hyperthermia Neg Hx   . Pseudochol deficiency Neg Hx     Social History Social History   Tobacco Use  . Smoking status: Current Every Day Smoker    Packs/day: 1.00    Years: 4.00    Pack years: 4.00    Types: Cigarettes  . Smokeless tobacco: Former Engineer, water Use Topics  . Alcohol use: No    Comment: ocassionaly   . Drug use: Yes    Types: Marijuana, Other-see comments, Oxycodone    Comment: hx - weekly vicodin use, last marijuana use 05/05/12     Allergies   Patient has no known allergies.   Review of Systems Review of Systems  Constitutional: Negative for fever.  HENT: Negative for congestion, dental problem, rhinorrhea, sinus pressure and sore throat.   Eyes: Negative for photophobia, discharge, redness and visual disturbance.  Respiratory: Negative for  cough and shortness of breath.   Cardiovascular: Negative for chest pain.  Gastrointestinal: Positive for nausea and vomiting. Negative for abdominal pain and diarrhea.  Genitourinary: Negative for dysuria.  Musculoskeletal: Negative for gait problem, myalgias, neck pain and neck stiffness.  Skin: Negative for rash.  Neurological: Positive for seizures. Negative for syncope, speech difficulty, weakness, light-headedness, numbness and headaches.  Psychiatric/Behavioral: Negative for confusion.     Physical Exam Updated Vital Signs BP 118/73   Pulse 83   Temp 98.2 F (36.8 C) (Oral)   Resp 20   Ht 5\' 2"  (1.575 m)   Wt 68 kg (150 lb)   LMP 08/16/2017 (Approximate)   SpO2 99%   BMI 27.44 kg/m   Physical Exam  Constitutional: She is oriented to person, place, and time. She appears well-developed and well-nourished.  HENT:  Head: Normocephalic and atraumatic.  Right Ear: Tympanic membrane, external ear and ear canal normal.  Left Ear: Tympanic membrane, external ear and ear canal normal.  Nose: Nose normal.  Mouth/Throat: Uvula is midline, oropharynx is clear and moist and mucous membranes are normal.  Eyes: Conjunctivae, EOM and lids are normal. Pupils are equal, round, and reactive to light. Right eye exhibits no discharge. Left eye exhibits no discharge. Right eye exhibits no nystagmus. Left eye exhibits no nystagmus.  Neck: Normal range of motion. Neck supple.  Cardiovascular: Normal rate, regular rhythm and normal heart sounds.  Pulmonary/Chest: Effort normal and breath sounds normal. No stridor. No respiratory distress. She has no wheezes.  Abdominal: Soft. There is no tenderness. There is no rebound and no guarding.  Musculoskeletal: She exhibits tenderness. She exhibits no edema.       Right shoulder: Normal.       Right elbow: She exhibits swelling. She exhibits normal range of motion. Tenderness found.       Right wrist: Normal.       Cervical back: She exhibits normal  range of motion, no tenderness and no bony tenderness.       Right upper arm: Normal.       Right forearm: Normal.  Neurological: She is alert and oriented to person, place, and time. She has normal strength and normal reflexes. No cranial nerve deficit or sensory deficit. She displays a negative Romberg sign. Coordination and gait normal. GCS eye subscore is 4. GCS verbal subscore is 5. GCS motor subscore is 6.  Skin: Skin is warm and dry.  Psychiatric: She has a normal mood and affect.  Nursing note and vitals reviewed.    ED Treatments / Results  Labs (all labs ordered are listed,  but only abnormal results are displayed) Labs Reviewed  BASIC METABOLIC PANEL - Abnormal; Notable for the following components:      Result Value   Chloride 99 (*)    Glucose, Bld 102 (*)    All other components within normal limits  CBC - Abnormal; Notable for the following components:   WBC 10.9 (*)    All other components within normal limits  RAPID URINE DRUG SCREEN, HOSP PERFORMED  URINALYSIS, ROUTINE W REFLEX MICROSCOPIC  ETHANOL  I-STAT BETA HCG BLOOD, ED ([facility], [facility], [facility] ONLY)    ED ECG REPORT   Date: 08/31/2017  Rate: 71  Rhythm: normal sinus rhythm  QRS Axis: normal  Intervals: normal  ST/T Wave abnormalities: normal  Conduction Disutrbances:none  Narrative Interpretation:   Old EKG Reviewed: changes noted, slower today and normal QTc  I have personally reviewed the EKG tracing and agree with the computerized printout as noted.  Radiology Dg Elbow Complete Right  Result Date: 08/30/2017 CLINICAL DATA:  Right elbow pain after fall, seizure. EXAM: RIGHT ELBOW - COMPLETE 3+ VIEW COMPARISON:  None. FINDINGS: There is no evidence of fracture, dislocation, or joint effusion. There is no evidence of arthropathy or other focal bone abnormality. Soft tissues are unremarkable. IMPRESSION: Negative radiographs of the right elbow. Electronically Signed   By: Rubye OaksMelanie  Ehinger M.D.   On: 08/30/2017  23:31   Ct Head Wo Contrast  Result Date: 08/30/2017 CLINICAL DATA:  Seizure, new, nontraumatic, 18-40 yrs. Seizure or today. EXAM: CT HEAD WITHOUT CONTRAST TECHNIQUE: Contiguous axial images were obtained from the base of the skull through the vertex without intravenous contrast. COMPARISON:  Head CT 02/10/2017 FINDINGS: Brain: No intracranial hemorrhage, mass effect, or midline shift. No hydrocephalus. The basilar cisterns are patent. No evidence of territorial infarct or acute ischemia. No extra-axial or intracranial fluid collection. Vascular: No hyperdense vessel or unexpected calcification. Skull: No fracture or focal lesion. Sinuses/Orbits: Chronic mucosal thickening of the ethmoid air cells and left sigmoid sinus, with slight improvement from prior exam. Mastoid air cells are clear. Visualized orbits are unremarkable. Other: None. IMPRESSION: 1.  No acute intracranial abnormality. 2. Chronic paranasal sinus disease with mild improvement from prior exam. Electronically Signed   By: Rubye OaksMelanie  Ehinger M.D.   On: 08/30/2017 23:56    Procedures Procedures (including critical care time)  Medications Ordered in ED Medications - No data to display   Initial Impression / Assessment and Plan / ED Course  I have reviewed the triage vital signs and the nursing notes.  Pertinent labs & imaging results that were available during my care of the patient were reviewed by me and considered in my medical decision making (see chart for details).     Patient seen and examined. Work-up initiated. Sodium is normal. Abd is soft.   Vital signs reviewed and are as follows: BP 118/73   Pulse 83   Temp 98.2 F (36.8 C) (Oral)   Resp 20   Ht 5\' 2"  (1.575 m)   Wt 68 kg (150 lb)   LMP 08/16/2017 (Approximate)   SpO2 99%   BMI 27.44 kg/m   Patient updated on results. Urine pending -- states unable to urinate. Will give IV fluids, zofran, re-eval. Head CT reviewed by myself.  Anticipate discharge to home  with neurology follow-up.  Patient has done well in the emergency department without any seizure-like activity.  She will be discharged home.  Patient encouraged to rest.  I ensure that the patient's contact information  is current.  Discussed that she cannot drive for 6 months seizure free.  Encourage patient to return with new or worsening symptoms including additional seizures.  She verbalizes understanding and agrees with plan.  Final Clinical Impressions(s) / ED Diagnoses   Final diagnoses:  Seizure (HCC)  Non-intractable vomiting with nausea, unspecified vomiting type   Patient with new onset seizure today.  Head CT negative.  Electrolytes normal.  Patient does have a substance abuse history but denies recent heavy use of alcohol, benzodiazepines, or tramadol.  No indication to start antiepileptics at this point unless patient has additional episodes.  Neurology follow-up given.  Return instructions as above.  ED Discharge Orders        Ordered    Ambulatory referral to Neurology    Comments:  An appointment is requested in approximately: 1 week  New onset seizure   08/31/17 0300    ondansetron (ZOFRAN ODT) 4 MG disintegrating tablet  Every 8 hours PRN     08/31/17 0303       Renne Crigler, PA-C 08/31/17 1610    Geoffery Lyons, MD 08/31/17 [phone]

## 2017-08-30 NOTE — ED Triage Notes (Signed)
Pt presents with onset of 5-minute witnessed seizure while at work today.  Co-worker reports pt had full body seizure.  No mouth trauma or incontinence reported.  Pt has no recollection of event, mother reports pt is not withdrawing from any medication or drugs/alcohol.

## 2017-08-30 NOTE — ED Notes (Signed)
Patient transported to X-ray 

## 2017-08-31 LAB — URINALYSIS, ROUTINE W REFLEX MICROSCOPIC
Bilirubin Urine: NEGATIVE
GLUCOSE, UA: NEGATIVE mg/dL
Hgb urine dipstick: NEGATIVE
Ketones, ur: 80 mg/dL — AB
NITRITE: NEGATIVE
PROTEIN: NEGATIVE mg/dL
SPECIFIC GRAVITY, URINE: 1.02 (ref 1.005–1.030)
pH: 6 (ref 5.0–8.0)

## 2017-08-31 LAB — RAPID URINE DRUG SCREEN, HOSP PERFORMED
Amphetamines: NOT DETECTED
Barbiturates: NOT DETECTED
Benzodiazepines: NOT DETECTED
Cocaine: NOT DETECTED
Opiates: POSITIVE — AB
Tetrahydrocannabinol: POSITIVE — AB

## 2017-08-31 MED ORDER — SODIUM CHLORIDE 0.9 % IV BOLUS (SEPSIS)
1000.0000 mL | Freq: Once | INTRAVENOUS | Status: AC
  Administered 2017-08-31: 1000 mL via INTRAVENOUS

## 2017-08-31 MED ORDER — ONDANSETRON 4 MG PO TBDP: 4.0000 mg | ORAL_TABLET | Freq: Three times a day (TID) | ORAL | 0 refills | Status: DC | PRN

## 2017-08-31 MED ORDER — ONDANSETRON HCL 4 MG/2ML IJ SOLN
4.0000 mg | Freq: Once | INTRAMUSCULAR | Status: AC
  Administered 2017-08-31: 4 mg via INTRAVENOUS
  Filled 2017-08-31: qty 2

## 2017-08-31 NOTE — Discharge Instructions (Signed)
Please read and follow all provided instructions.  Your diagnoses today include:  1. Seizure (HCC)   2. Non-intractable vomiting with nausea, unspecified vomiting type    Tests performed today include:  Blood counts and electrolytes -normal electrolytes  Urine test - slight dehydration  Ct head - no problems seen with the brain  Vital signs. See below for your results today.   Medications prescribed:   None  Take any prescribed medications only as directed.  Home care instructions:  Follow any educational materials contained in this packet.  BE VERY CAREFUL not to take multiple medicines containing Tylenol (also called acetaminophen). Doing so can lead to an overdose which can damage your liver and cause liver failure and possibly death.   Follow-up instructions: Please follow-up with neurologist listed for further evaluation of your seizure.  Return instructions:   Please return to the Emergency Department if you experience worsening symptoms.   Return if you have another seizure.  Return if you have weakness in your arms or legs, slurred speech, trouble walking or talking, confusion, or trouble with your balance.   Please return if you have any other emergent concerns.  Additional Information:  Your vital signs today were: BP 116/71    Pulse (!) 57    Temp 98.2 F (36.8 C) (Oral)    Resp 16    Ht 5\' 2"  (1.575 m)    Wt 68 kg (150 lb)    LMP 08/16/2017 (Approximate)    SpO2 99%    BMI 27.44 kg/m  If your blood pressure (BP) was elevated above 135/85 this visit, please have this repeated by your doctor within one month. --------------

## 2018-03-29 ENCOUNTER — Emergency Department (HOSPITAL_COMMUNITY)
Admission: EM | Admit: 2018-03-29 | Discharge: 2018-03-29 | Disposition: A | Discharge status: Home / Self Care | Payer: Managed Care, Other (non HMO) | Attending: Emergency Medicine | Admitting: Emergency Medicine

## 2018-03-29 ENCOUNTER — Encounter (HOSPITAL_COMMUNITY): Payer: Self-pay

## 2018-03-29 ENCOUNTER — Emergency Department (HOSPITAL_COMMUNITY): Payer: Managed Care, Other (non HMO)

## 2018-03-29 DIAGNOSIS — J181 Lobar pneumonia, unspecified organism: Secondary | ICD-10-CM | POA: Diagnosis not present

## 2018-03-29 DIAGNOSIS — R0981 Nasal congestion: Secondary | ICD-10-CM | POA: Diagnosis not present

## 2018-03-29 DIAGNOSIS — F1210 Cannabis abuse, uncomplicated: Secondary | ICD-10-CM | POA: Insufficient documentation

## 2018-03-29 DIAGNOSIS — F1110 Opioid abuse, uncomplicated: Secondary | ICD-10-CM | POA: Diagnosis not present

## 2018-03-29 DIAGNOSIS — F17210 Nicotine dependence, cigarettes, uncomplicated: Secondary | ICD-10-CM | POA: Diagnosis not present

## 2018-03-29 DIAGNOSIS — H66002 Acute suppurative otitis media without spontaneous rupture of ear drum, left ear: Secondary | ICD-10-CM | POA: Insufficient documentation

## 2018-03-29 DIAGNOSIS — R6883 Chills (without fever): Secondary | ICD-10-CM | POA: Diagnosis not present

## 2018-03-29 DIAGNOSIS — R05 Cough: Secondary | ICD-10-CM | POA: Diagnosis present

## 2018-03-29 DIAGNOSIS — J189 Pneumonia, unspecified organism: Secondary | ICD-10-CM

## 2018-03-29 DIAGNOSIS — H9202 Otalgia, left ear: Secondary | ICD-10-CM | POA: Insufficient documentation

## 2018-03-29 LAB — I-STAT CG4 LACTIC ACID, ED: LACTIC ACID, VENOUS: 1.03 mmol/L (ref 0.5–1.9)

## 2018-03-29 LAB — COMPREHENSIVE METABOLIC PANEL
ALBUMIN: 3.5 g/dL (ref 3.5–5.0)
ALT: 9 U/L (ref 0–44)
AST: 16 U/L (ref 15–41)
Alkaline Phosphatase: 75 U/L (ref 38–126)
Anion gap: 9 (ref 5–15)
BUN: 7 mg/dL (ref 6–20)
CHLORIDE: 102 mmol/L (ref 98–111)
CO2: 26 mmol/L (ref 22–32)
Calcium: 9.1 mg/dL (ref 8.9–10.3)
Creatinine, Ser: 0.65 mg/dL (ref 0.44–1.00)
GFR calc Af Amer: >60 mL/min (ref >60)
GFR calc non Af Amer: >60 mL/min (ref >60)
GLUCOSE: 163 mg/dL — AB (ref 70–99)
POTASSIUM: 4 mmol/L (ref 3.5–5.1)
Sodium: 137 mmol/L (ref 135–145)
Total Bilirubin: 0.8 mg/dL (ref 0.3–1.2)
Total Protein: 7.3 g/dL (ref 6.5–8.1)

## 2018-03-29 LAB — CBC WITH DIFFERENTIAL/PLATELET
Abs Immature Granulocytes: 0.1 10*3/uL (ref 0.0–0.1)
BASOS PCT: 0 %
Basophils Absolute: 0 10*3/uL (ref 0.0–0.1)
EOS ABS: 0.1 10*3/uL (ref 0.0–0.7)
EOS PCT: 1 %
HEMATOCRIT: 39 % (ref 36.0–46.0)
Hemoglobin: 12.5 g/dL (ref 12.0–15.0)
Immature Granulocytes: 0 %
LYMPHS PCT: 9 %
Lymphs Abs: 1.5 10*3/uL (ref 0.7–4.0)
MCH: 31.8 pg (ref 26.0–34.0)
MCHC: 32.1 g/dL (ref 30.0–36.0)
MCV: 99.2 fL (ref 78.0–100.0)
MONO ABS: 0.8 10*3/uL (ref 0.1–1.0)
Monocytes Relative: 5 %
NEUTROS ABS: 14.3 10*3/uL — AB (ref 1.7–7.7)
Neutrophils Relative %: 85 %
Platelets: 231 10*3/uL (ref 150–400)
RBC: 3.93 MIL/uL (ref 3.87–5.11)
RDW: 12.9 % (ref 11.5–15.5)
WBC: 16.8 10*3/uL — ABNORMAL HIGH (ref 4.0–10.5)

## 2018-03-29 LAB — I-STAT BETA HCG BLOOD, ED (MC, WL, AP ONLY): I-stat hCG, quantitative: <5 m[IU]/mL (ref <5)

## 2018-03-29 IMAGING — DX DG CHEST 2V
2 series · 2 of 2 positions shown · non-contrast
Comparison: None.

CLINICAL DATA: Cough and congestion with fever

EXAM:
CHEST - 2 VIEW

[w chest pa]
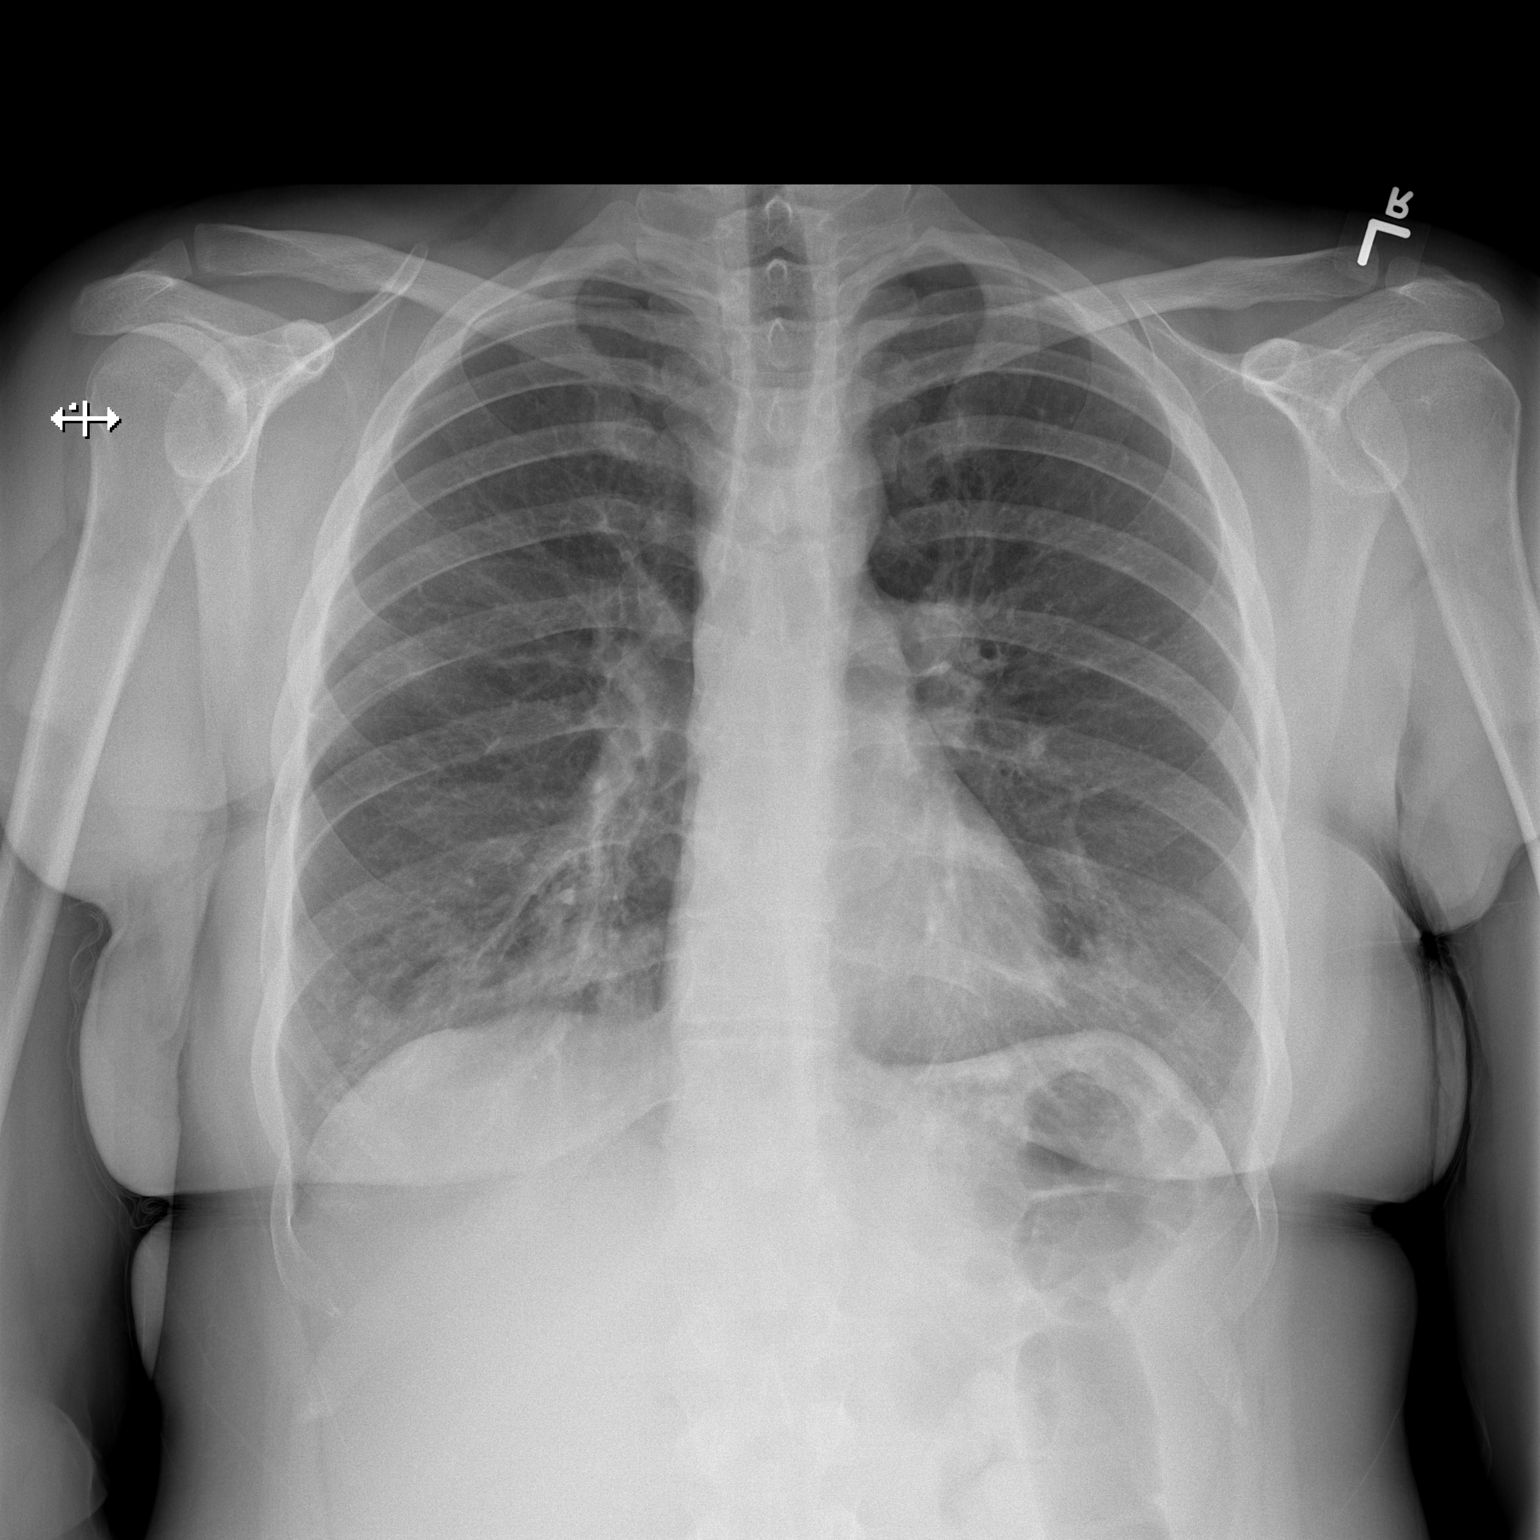

[w chest lat]
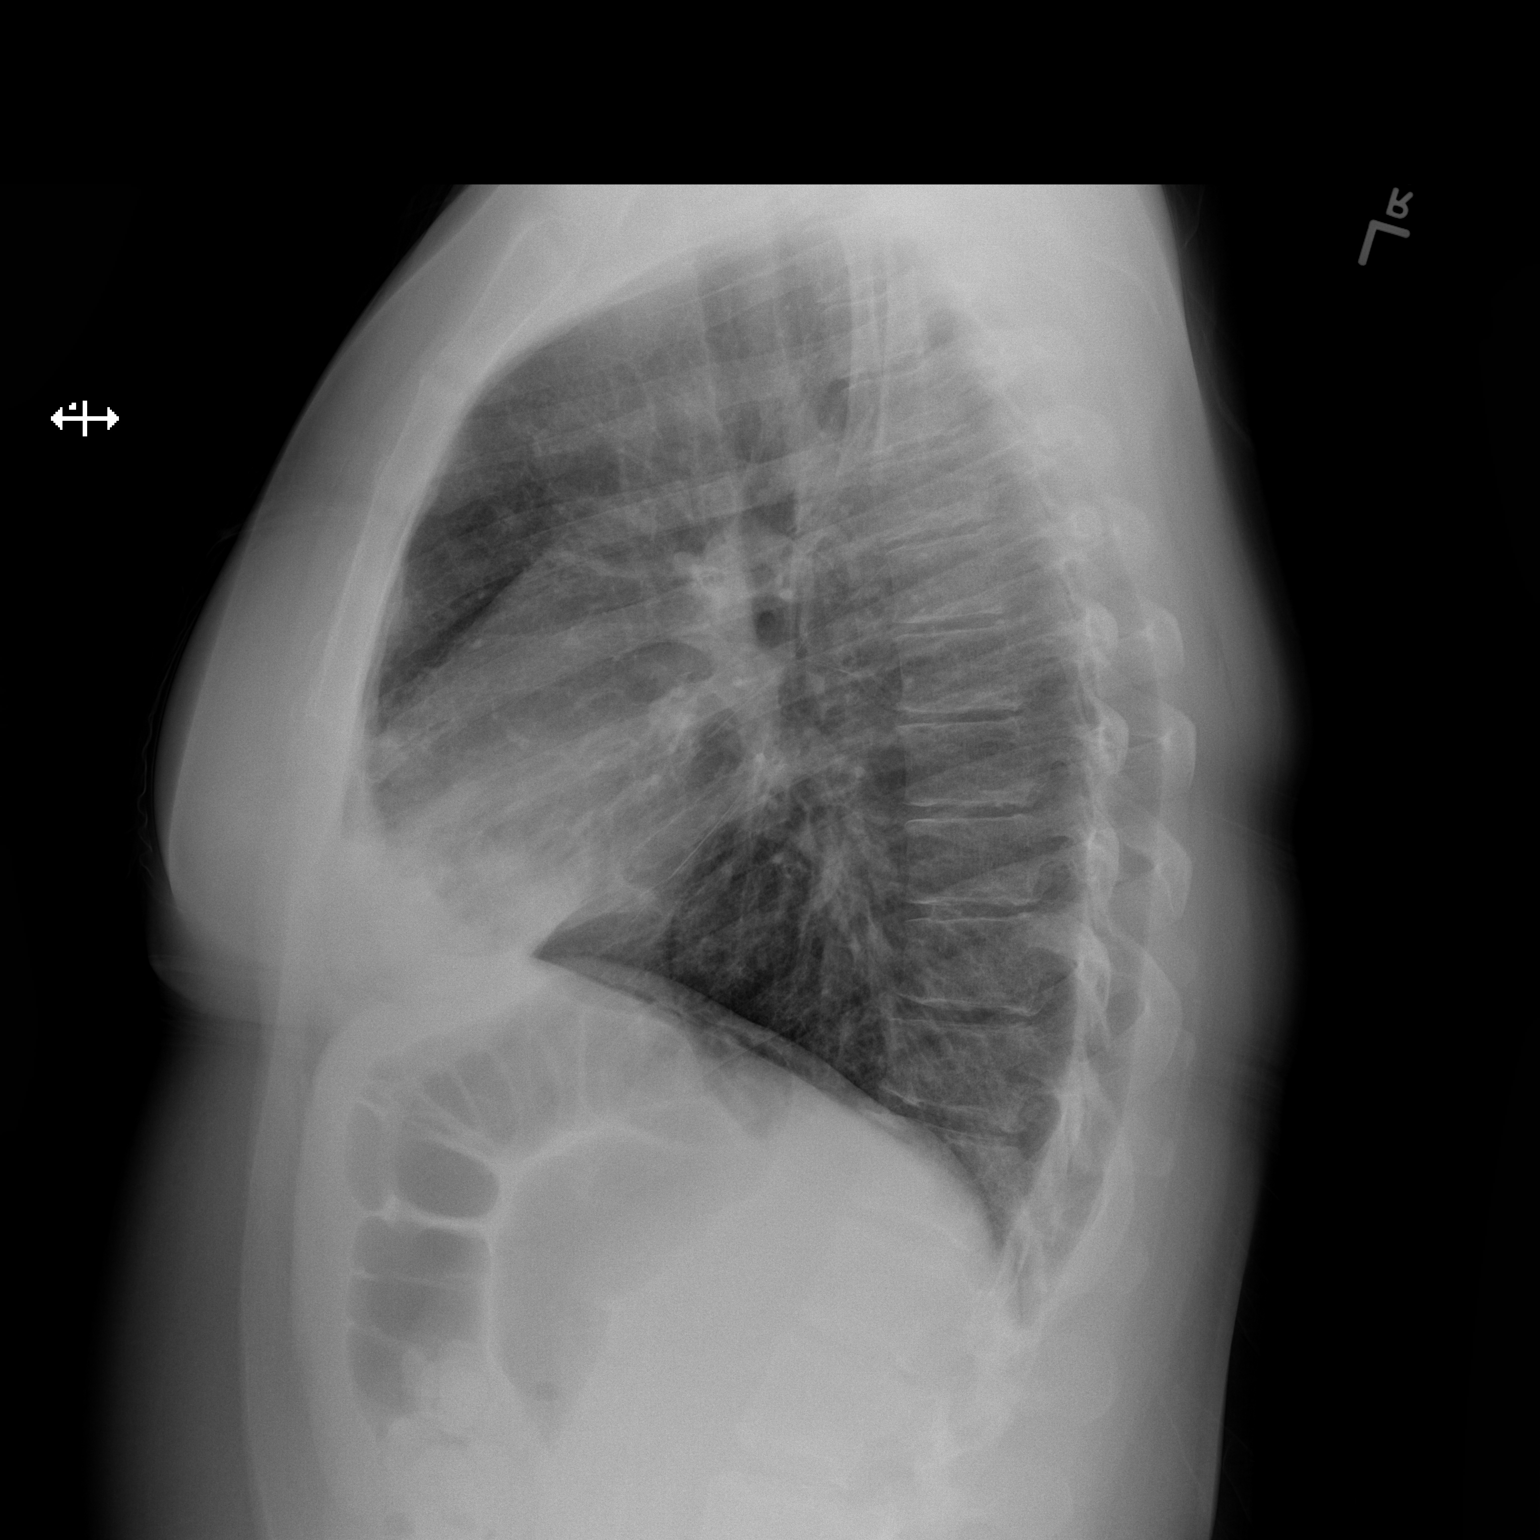

[2 of 2 positions shown; findings below may reference images not displayed]

FINDINGS: There is patchy infiltrate in the right middle lobe. There is also
subtle infiltrate in the left base, likely in the inferior lingula.
Lungs elsewhere are clear. Heart size and pulmonary vascular normal.
No adenopathy. No bone lesions.
IMPRESSION: Patchy pneumonia in the right middle lobe and inferior lingular
regions. Lungs elsewhere clear. No evident adenopathy.

## 2018-03-29 MED ORDER — AZITHROMYCIN 250 MG PO TABS
500.0000 mg | ORAL_TABLET | Freq: Once | ORAL | Status: AC
  Administered 2018-03-29: 500 mg via ORAL
  Filled 2018-03-29: qty 2

## 2018-03-29 MED ORDER — ALBUTEROL SULFATE HFA 108 (90 BASE) MCG/ACT IN AERS
2.0000 | INHALATION_SPRAY | RESPIRATORY_TRACT | Status: DC | PRN
  Administered 2018-03-29: 2 via RESPIRATORY_TRACT
  Filled 2018-03-29: qty 6.7

## 2018-03-29 MED ORDER — AZITHROMYCIN 250 MG PO TABS: 250.0000 mg | ORAL_TABLET | Freq: Every day | ORAL | 0 refills | Status: DC

## 2018-03-29 MED ORDER — BENZONATATE 100 MG PO CAPS: 100.0000 mg | ORAL_CAPSULE | Freq: Three times a day (TID) | ORAL | 0 refills | Status: DC

## 2018-03-29 NOTE — ED Notes (Signed)
Patient asking to leave.  Worked on rooming patient in hallway bed.  States she would like to step outside to make phone call to arrange childcare

## 2018-03-29 NOTE — ED Notes (Signed)
Pt ambulated to room from waiting room, tolerated well. 

## 2018-03-29 NOTE — ED Triage Notes (Signed)
Pt presents for evaluation of dry cough, nasal congestion x 2-3 weeks. Pt reports some L ear pain. Denies sore throat.

## 2018-03-29 NOTE — ED Notes (Signed)
Patient verbalizes understanding of discharge instructions. Opportunity for questioning and answers were provided. Pt discharged from ED. 

## 2018-03-29 NOTE — ED Provider Notes (Signed)
[facility] EMERGENCY DEPARTMENT Provider Note   CSN: [id number] Arrival date & time: 03/29/18  6295     History   Chief Complaint Chief Complaint  Patient presents with  . URI    HPI Sharon [name] is a 27 y.o. female.  Patient presents with complaint of cough, congestion, fatigue, and chills for the past 2 to 3 weeks.  At onset, her son was sick.  Patient thought that she caught his infection and developed bronchitis.  Over the past several days she has also developed left ear pain without drainage from the ear canal.  She is been taking over-the-counter medications.  No known fevers or sore throat.  No chest pain.  No lower extremity swelling, history of blood clots.  Patient denies IV drug use.  She does have history of substance abuse.  Onset of symptoms acute.  Course is persistent.  Nothing makes symptoms better or worse.     Past Medical History:  Diagnosis Date  . Anxiety    no meds  . Arthritis    right lower arm tendonitis  . Carpal tunnel syndrome   . Headache(784.0)    otc med prn    Patient Active Problem List   Diagnosis Date Noted  . Acute encephalopathy 02/10/2017  . Previous cesarean delivery, delivered, with or without mention of antepartum condition 05/17/2012    Past Surgical History:  Procedure Laterality Date  . CESAREAN SECTION  05/16/2012   Procedure: CESAREAN SECTION;  Surgeon: Brock Bad, MD;  Location: [facility];  Service: Obstetrics;  Laterality: N/A;  Primary  . FOOT SURGERY  1997   removal foreign body (sewing needle)  . TOOTH EXTRACTION  2008   upper incisor x2     OB History    Gravida  1   Para  1   Term  1   Preterm      AB      Living  1     SAB      TAB      Ectopic      Multiple      Live Births  1        Obstetric Comments  1 st pregnancy , unplanned         Home Medications    Prior to Admission medications   Medication Sig Start Date End Date Taking? Authorizing  Provider  azithromycin (ZITHROMAX) 250 MG tablet Take 1 tablet (250 mg total) by mouth daily. 03/29/18   Renne Crigler, PA-C  benzonatate (TESSALON) 100 MG capsule Take 1 capsule (100 mg total) by mouth every 8 (eight) hours. 03/29/18   Renne Crigler, PA-C  ondansetron (ZOFRAN ODT) 4 MG disintegrating tablet Take 1 tablet (4 mg total) by mouth every 8 (eight) hours as needed for nausea or vomiting. 08/31/17   Renne Crigler, PA-C    Family History Family History  Problem Relation Age of Onset  . Seizures Father   . Diabetes Other   . CAD Other   . Hypertension Other   . Anesthesia problems Neg Hx   . Hypotension Neg Hx   . Malignant hyperthermia Neg Hx   . Pseudochol deficiency Neg Hx     Social History Social History   Tobacco Use  . Smoking status: Current Every Day Smoker    Packs/day: 1.00    Years: 4.00    Pack years: 4.00    Types: Cigarettes  . Smokeless tobacco: Former Engineer, water Use Topics  .  Alcohol use: No    Comment: ocassionaly   . Drug use: Yes    Types: Marijuana, Other-see comments, Oxycodone    Comment: hx - weekly vicodin use, last marijuana use 05/05/12     Allergies   Patient has no known allergies.   Review of Systems Review of Systems  Constitutional: Positive for chills and fatigue. Negative for fever.  HENT: Positive for congestion and ear pain. Negative for rhinorrhea, sinus pressure and sore throat.   Eyes: Negative for redness.  Respiratory: Positive for cough. Negative for shortness of breath and wheezing.   Cardiovascular: Negative for chest pain and leg swelling.  Gastrointestinal: Negative for abdominal pain, diarrhea, nausea and vomiting.  Genitourinary: Negative for dysuria.  Musculoskeletal: Negative for myalgias and neck stiffness.  Skin: Negative for rash.  Neurological: Negative for headaches.  Hematological: Negative for adenopathy.     Physical Exam Updated Vital Signs BP (!) 147/81 (BP Location: Right Arm)   Pulse  (!) 108   Temp 98.6 F (37 C) (Oral)   Resp 18   LMP 03/08/2018 (Approximate)   SpO2 97%   Physical Exam  Constitutional: She appears well-developed and well-nourished.  HENT:  Head: Normocephalic and atraumatic.  Right Ear: Tympanic membrane, external ear and ear canal normal.  Left Ear: External ear and ear canal normal. Tympanic membrane is erythematous and bulging.  Nose: Nose normal. No mucosal edema or rhinorrhea.  Mouth/Throat: Uvula is midline, oropharynx is clear and moist and mucous membranes are normal. Mucous membranes are not dry. No oral lesions. No trismus in the jaw. No uvula swelling. No oropharyngeal exudate, posterior oropharyngeal edema, posterior oropharyngeal erythema or tonsillar abscesses.  Eyes: Conjunctivae are normal. Right eye exhibits no discharge. Left eye exhibits no discharge.  Neck: Normal range of motion. Neck supple.  Cardiovascular: Regular rhythm and normal heart sounds.  No murmur heard. HR 100  Pulmonary/Chest: Effort normal and breath sounds normal. No respiratory distress. She has no wheezes. She has no rales.  Abdominal: Soft. There is no tenderness.  Lymphadenopathy:    She has no cervical adenopathy.  Neurological: She is alert.  Skin: Skin is warm and dry.  Psychiatric: She has a normal mood and affect.  Nursing note and vitals reviewed.    ED Treatments / Results  Labs (all labs ordered are listed, but only abnormal results are displayed) Labs Reviewed  COMPREHENSIVE METABOLIC PANEL - Abnormal; Notable for the following components:      Result Value   Glucose, Bld 163 (*)    All other components within normal limits  CBC WITH DIFFERENTIAL/PLATELET - Abnormal; Notable for the following components:   WBC 16.8 (*)    Neutro Abs 14.3 (*)    All other components within normal limits  I-STAT CG4 LACTIC ACID, ED  I-STAT BETA HCG BLOOD, ED ([facility], [facility], [facility] ONLY)    EKG None  Radiology Dg Chest 2 View  Result Date:  03/29/2018 CLINICAL DATA:  Cough and congestion with fever EXAM: CHEST - 2 VIEW COMPARISON:  None. FINDINGS: There is patchy infiltrate in the right middle lobe. There is also subtle infiltrate in the left base, likely in the inferior lingula. Lungs elsewhere are clear. Heart size and pulmonary vascular normal. No adenopathy. No bone lesions. IMPRESSION: Patchy pneumonia in the right middle lobe and inferior lingular regions. Lungs elsewhere clear. No evident adenopathy. Electronically Signed   By: Bretta Bang III M.D.   On: 03/29/2018 11:17    Procedures Procedures (including critical  care time)  Medications Ordered in ED Medications  albuterol (PROVENTIL HFA;VENTOLIN HFA) 108 (90 Base) MCG/ACT inhaler 2 puff (2 puffs Inhalation Given 03/29/18 1437)  azithromycin (ZITHROMAX) tablet 500 mg (500 mg Oral Given 03/29/18 1437)     Initial Impression / Assessment and Plan / ED Course  I have reviewed the triage vital signs and the nursing notes.  Pertinent labs & imaging results that were available during my care of the patient were reviewed by me and considered in my medical decision making (see chart for details).     Patient seen and examined.   Vital signs reviewed and are as follows: BP (!) 147/81 (BP Location: Right Arm)   Pulse (!) 108   Temp 98.6 F (37 C) (Oral)   Resp 18   LMP 03/08/2018 (Approximate)   SpO2 97%   Chest x-ray demonstrates pneumonia.  Patient also has a left otitis media which is obvious on exam.  She denies IV drug use and does not have any active fevers.  She does not have any clinical signs and symptoms of DVT.  No history of DVT or PE.  She does not have any significant chest pain suggestive of PE.  Suspect that she does have an infection.  Will treat with azithromycin, albuterol, Tessalon.  Encourage PCP follow-up for recheck in the next week.  Encourage patient to return to the emergency department with high persistent fever, persistent vomiting,  worsening chest pain, worsening shortness of breath or trouble breathing, or other concerns.  Patient verbalizes understanding agrees with plan.  She is in no distress during exam or at time of discharge.  Final Clinical Impressions(s) / ED Diagnoses   Final diagnoses:  Community acquired pneumonia of right middle lobe of lung (HCC)  Non-recurrent acute suppurative otitis media of left ear without spontaneous rupture of tympanic membrane   Patient with cough since exposure to her son who was sick with what sounds like a viral illness.  Patient has persistent nonproductive cough.  No fevers.  She has apparent community-acquired pneumonia on chest x-ray with a left-sided otitis media.  Treatment as above.  Doubt DVT/PE given history and clinical findings.  No murmur on exam.  Encourage PCP follow-up for recheck.   ED Discharge Orders         Ordered    azithromycin (ZITHROMAX) 250 MG tablet  Daily     03/29/18 1446    benzonatate (TESSALON) 100 MG capsule  Every 8 hours     03/29/18 1446           Renne Crigler, PA-C 03/29/18 1452    Wynetta Fines, MD 03/30/18 1149

## 2018-03-29 NOTE — ED Notes (Signed)
Results reviewed, no changes in acuity at this time 

## 2018-03-29 NOTE — Discharge Instructions (Signed)
Please read and follow all provided instructions.  Your diagnoses today include:  1. Community acquired pneumonia of right middle lobe of lung (HCC)   2. Non-recurrent acute suppurative otitis media of left ear without spontaneous rupture of tympanic membrane     Tests performed today include:  Blood counts and electrolytes  Chest x-ray -- shows pneumonia  Vital signs. See below for your results today.   Medications prescribed:   Azithromycin - antibiotic for respiratory infection  You have been prescribed an antibiotic medicine: take the entire course of medicine even if you are feeling better. Stopping early can cause the antibiotic not to work.   Tessalon Perles - cough suppressant medication   Albuterol inhaler - medication that opens up your airway  Use inhaler as follows: 1-2 puffs with spacer every 4 hours as needed for wheezing, cough, or shortness of breath.   Take any prescribed medications only as directed.  Home care instructions:  Follow any educational materials contained in this packet.  Take the complete course of antibiotics that you were prescribed.   BE VERY CAREFUL not to take multiple medicines containing Tylenol (also called acetaminophen). Doing so can lead to an overdose which can damage your liver and cause liver failure and possibly death.   Follow-up instructions: Please follow-up with your primary care provider in the next 3 days for further evaluation of your symptoms and to ensure resolution of your infection.   Return instructions:   Please return to the Emergency Department if you experience worsening symptoms.   Return immediately with worsening breathing, worsening shortness of breath, or if you feel it is taking you more effort to breathe.   Please return if you have any other emergent concerns.  Additional Information:  Your vital signs today were: BP (!) 147/81 (BP Location: Right Arm)    Pulse (!) 108    Temp 98.6 F (37 C)  (Oral)    Resp 18    LMP 03/08/2018 (Approximate)    SpO2 97%  If your blood pressure (BP) was elevated above 135/85 this visit, please have this repeated by your doctor within one month. --------------

## 2018-03-29 NOTE — ED Notes (Signed)
Called pt to reassess. Pt in restroom

## 2018-08-11 ENCOUNTER — Emergency Department (HOSPITAL_COMMUNITY)
Admission: EM | Admit: 2018-08-11 | Discharge: 2018-08-11 | Disposition: A | Discharge status: Home / Self Care | Payer: Managed Care, Other (non HMO) | Attending: Emergency Medicine | Admitting: Emergency Medicine

## 2018-08-11 ENCOUNTER — Other Ambulatory Visit: Payer: Self-pay

## 2018-08-11 ENCOUNTER — Encounter (HOSPITAL_COMMUNITY): Payer: Self-pay | Admitting: Emergency Medicine

## 2018-08-11 DIAGNOSIS — F17210 Nicotine dependence, cigarettes, uncomplicated: Secondary | ICD-10-CM | POA: Insufficient documentation

## 2018-08-11 DIAGNOSIS — R222 Localized swelling, mass and lump, trunk: Secondary | ICD-10-CM | POA: Diagnosis present

## 2018-08-11 DIAGNOSIS — L0231 Cutaneous abscess of buttock: Secondary | ICD-10-CM | POA: Diagnosis not present

## 2018-08-11 MED ORDER — LIDOCAINE-EPINEPHRINE (PF) 2 %-1:200000 IJ SOLN
20.0000 mL | Freq: Once | INTRAMUSCULAR | Status: AC
  Administered 2018-08-11: 20 mL
  Filled 2018-08-11: qty 20

## 2018-08-11 MED ORDER — DOXYCYCLINE HYCLATE 100 MG PO CAPS: 100.0000 mg | ORAL_CAPSULE | Freq: Two times a day (BID) | ORAL | 0 refills | Status: AC

## 2018-08-11 MED ORDER — DOXYCYCLINE HYCLATE 100 MG PO TABS
100.0000 mg | ORAL_TABLET | Freq: Once | ORAL | Status: AC
  Administered 2018-08-11: 100 mg via ORAL
  Filled 2018-08-11: qty 1

## 2018-08-11 MED ORDER — HYDROCODONE-ACETAMINOPHEN 5-325 MG PO TABS
2.0000 | ORAL_TABLET | Freq: Once | ORAL | Status: AC
  Administered 2018-08-11: 2 via ORAL
  Filled 2018-08-11: qty 2

## 2018-08-11 NOTE — ED Triage Notes (Signed)
C/o abscess to R buttocks x 3-4 days that has been worse over the last 2 days.  Nausea 2 days ago that resolved.  Denies fever/chills.

## 2018-08-11 NOTE — ED Provider Notes (Signed)
[facility] Provider Note   CSN: [id number] Arrival date & time: 08/11/18  0011     History   Chief Complaint Chief Complaint  Patient presents with  . Abscess    HPI Sharon [name] is a 28 y.o. female with a hx of anxiety, arthritis presents to the Emergency Department complaining of gradual, persistent, progressively worsening swelling and pain of the right buttock onset approximately 3 days ago.  Patient reports it started as what looked like a small pimple but has worsened since that time.  She does have a history of abscess which has required I&D on the left buttock.  Patient reports 10 out of 10 pain.  She denies fever, chills, headache, neck pain, chest pain, shortness of breath, abdominal pain, nausea, vomiting, diarrhea, weakness, dizziness, syncope.  Treatments prior to arrival.  Palpation and sitting make the symptoms worse.  Nothing seems to make them better.    The history is provided by the patient and medical records. No language interpreter was used.    Past Medical History:  Diagnosis Date  . Anxiety    no meds  . Arthritis    right lower arm tendonitis  . Carpal tunnel syndrome   . Headache(784.0)    otc med prn    Patient Active Problem List   Diagnosis Date Noted  . Acute encephalopathy 02/10/2017  . Previous cesarean delivery, delivered, with or without mention of antepartum condition 05/17/2012    Past Surgical History:  Procedure Laterality Date  . CESAREAN SECTION  05/16/2012   Procedure: CESAREAN SECTION;  Surgeon: Brock Badharles A Harper, MD;  Location: [facility];  Service: Obstetrics;  Laterality: N/A;  Primary  . FOOT SURGERY  1997   removal foreign body (sewing needle)  . TOOTH EXTRACTION  2008   upper incisor x2     OB History    Gravida  1   Para  1   Term  1   Preterm      AB      Living  1     SAB      TAB      Ectopic      Multiple      Live Births  1        Obstetric Comments   1 st pregnancy , unplanned         Home Medications    Prior to Admission medications   Medication Sig Start Date End Date Taking? Authorizing Provider  azithromycin (ZITHROMAX) 250 MG tablet Take 1 tablet (250 mg total) by mouth daily. 03/29/18   Renne CriglerGeiple, Joshua, PA-C  benzonatate (TESSALON) 100 MG capsule Take 1 capsule (100 mg total) by mouth every 8 (eight) hours. 03/29/18   Renne CriglerGeiple, Joshua, PA-C  doxycycline (VIBRAMYCIN) 100 MG capsule Take 1 capsule (100 mg total) by mouth 2 (two) times daily for 7 days. 08/11/18 08/18/18  Veta Dambrosia, Dahlia ClientHannah, PA-C  ondansetron (ZOFRAN ODT) 4 MG disintegrating tablet Take 1 tablet (4 mg total) by mouth every 8 (eight) hours as needed for nausea or vomiting. 08/31/17   Renne CriglerGeiple, Joshua, PA-C    Family History Family History  Problem Relation Age of Onset  . Seizures Father   . Diabetes Other   . CAD Other   . Hypertension Other   . Anesthesia problems Neg Hx   . Hypotension Neg Hx   . Malignant hyperthermia Neg Hx   . Pseudochol deficiency Neg Hx     Social History Social  History   Tobacco Use  . Smoking status: Current Every Day Smoker    Packs/day: 1.00    Years: 4.00    Pack years: 4.00    Types: Cigarettes  . Smokeless tobacco: Former Engineer, water Use Topics  . Alcohol use: No    Comment: ocassionaly   . Drug use: Yes    Types: Marijuana, Other-see comments, Oxycodone    Comment: hx - weekly vicodin use, last marijuana use 05/05/12     Allergies   Patient has no known allergies.   Review of Systems Review of Systems  Constitutional: Negative for appetite change, chills, diaphoresis, fatigue, fever and unexpected weight change.  HENT: Negative for mouth sores.   Eyes: Negative for visual disturbance.  Respiratory: Negative for cough, chest tightness, shortness of breath and wheezing.   Cardiovascular: Negative for chest pain.  Gastrointestinal: Negative for abdominal pain, constipation, diarrhea, nausea and vomiting.    Endocrine: Negative for polydipsia, polyphagia and polyuria.  Genitourinary: Negative for dysuria, frequency, hematuria and urgency.  Musculoskeletal: Negative for back pain and neck stiffness.  Skin: Positive for color change and wound. Negative for rash.       Abscess  Allergic/Immunologic: Negative for immunocompromised state.  Neurological: Negative for syncope, light-headedness and headaches.  Hematological: Does not bruise/bleed easily.  Psychiatric/Behavioral: Negative for sleep disturbance. The patient is not nervous/anxious.      Physical Exam Updated Vital Signs BP 109/74   Pulse 90   Temp 98.5 F (36.9 C) (Oral)   Resp 18   LMP 06/26/2018   SpO2 100%   Physical Exam Vitals signs and nursing note reviewed.  Constitutional:      General: She is not in acute distress.    Appearance: She is well-developed. She is not diaphoretic.     Comments: Awake, alert, nontoxic appearance  HENT:     Head: Normocephalic and atraumatic.     Mouth/Throat:     Pharynx: No oropharyngeal exudate.  Eyes:     General: No scleral icterus.    Conjunctiva/sclera: Conjunctivae normal.  Neck:     Musculoskeletal: Normal range of motion and neck supple.  Cardiovascular:     Rate and Rhythm: Normal rate and regular rhythm.  Pulmonary:     Effort: Pulmonary effort is normal. No respiratory distress.     Breath sounds: Normal breath sounds. No wheezing.  Abdominal:     General: Bowel sounds are normal. There is no distension.     Palpations: Abdomen is soft. There is no mass.     Tenderness: There is no abdominal tenderness. There is no guarding or rebound.  Musculoskeletal: Normal range of motion.  Lymphadenopathy:     Cervical: No cervical adenopathy.  Skin:    General: Skin is warm and dry.     Findings: Erythema present.     Comments: Large area of erythema and induration to the right buttock.  Central scab in place but no focal area of fluctuance.  Neurological:     Mental  Status: She is alert and oriented to person, place, and time.     Comments: Speech is clear and goal oriented Moves extremities without ataxia      ED Treatments / Results   Procedures .Marland KitchenIncision and Drainage Date/Time: 08/11/2018 2:17 AM Performed by: Dierdre Forth, PA-C Authorized by: Dierdre Forth, PA-C   Consent:    Consent obtained:  Verbal   Consent given by:  Patient   Risks discussed:  Bleeding, incomplete drainage, pain and  damage to other organs   Alternatives discussed:  No treatment Universal protocol:    Procedure explained and questions answered to patient or proxy's satisfaction: yes     Relevant documents present and verified: yes     Test results available and properly labeled: yes     Imaging studies available: yes     Required blood products, implants, devices, and special equipment available: yes     Site/side marked: yes     Immediately prior to procedure a time out was called: yes     Patient identity confirmed:  Verbally with patient Location:    Type:  Abscess   Location:  Lower extremity   Lower extremity location:  Buttock   Buttock location:  R buttock Pre-procedure details:    Skin preparation:  Betadine Anesthesia (see MAR for exact dosages):    Anesthesia method:  Local infiltration   Local anesthetic:  Lidocaine 1% WITH epi Procedure type:    Complexity:  Complex Procedure details:    Incision types:  Single straight   Incision depth:  Subcutaneous   Scalpel blade:  11   Wound management:  Probed and deloculated, irrigated with saline and extensive cleaning   Drainage:  Purulent   Drainage amount:  Moderate   Packing materials:  1/4 in gauze Post-procedure details:    Patient tolerance of procedure:  Tolerated well, no immediate complications  Ultrasound ED Soft Tissue Date/Time: 08/11/2018 4:56 AM Performed by: Dierdre Forth, PA-C Authorized by: Dierdre Forth, PA-C   Procedure details:    Indications:  localization of abscess and evaluate for cellulitis     Transverse view:  Visualized   Images: archived     Limitations:  Patient compliance Location:    Location: buttocks     Side:  Right Findings:     abscess present    cellulitis present   (including critical care time)  Medications Ordered in ED Medications  HYDROcodone-acetaminophen (NORCO/VICODIN) 5-325 MG per tablet 2 tablet (2 tablets Oral Given 08/11/18 0208)  doxycycline (VIBRA-TABS) tablet 100 mg (100 mg Oral Given 08/11/18 0208)  lidocaine-EPINEPHrine (XYLOCAINE W/EPI) 2 %-1:200000 (PF) injection 20 mL (20 mLs Infiltration Given 08/11/18 0209)     Initial Impression / Assessment and Plan / ED Course  I have reviewed the triage vital signs and the nursing notes.  Pertinent labs & imaging results that were available during my care of the patient were reviewed by me and considered in my medical decision making (see chart for details).     Patient with area of cellulitis.  Ultrasound used to visualize abscess.  It is amenable to incision and drainage.  Due to size and shape of abscess, area close to the skin is small however it is deep.  Incision and drainage without complication.  Packing was used due to the depth of the abscess.  Wound recheck in 2 days. Encouraged home warm soaks and flushing.  Mild signs of cellulitis is surrounding skin.  Will d/c to home with doxycycline due to large area of cellulitis and previous history of abscess.  Patient without signs or symptoms of sepsis today.  She is well-appearing, tolerating fluids.     Final Clinical Impressions(s) / ED Diagnoses   Final diagnoses:  Abscess of buttock, right    ED Discharge Orders         Ordered    doxycycline (VIBRAMYCIN) 100 MG capsule  2 times daily     08/11/18 0418  Kearra Calkin, Boyd Kerbs 08/11/18 De Burrs, MD 08/11/18 [phone]

## 2018-08-11 NOTE — ED Notes (Signed)
Patient verbalizes understanding of discharge instructions. Opportunity for questioning and answers were provided. Armband removed by staff, pt discharged from ED. Ambulated out to waiting room

## 2018-08-11 NOTE — Discharge Instructions (Addendum)
1. Medications: doxycycline, usual home medications 2. Treatment: rest, drink plenty of fluids, use warm compresses, flush abscess with warm water several times per day 3. Follow Up: Please followup with your primary doctor in 2-3 days for discussion of your diagnoses and further evaluation after today's visit; if you do not have a primary care doctor use the resource guide provided to find one; Please return to the ER for fevers, chills, nausea, vomiting or other signs of infection  

## 2018-08-11 NOTE — ED Notes (Signed)
Pt called in lobby for triage but no answer. 

## 2018-11-09 ENCOUNTER — Inpatient Hospital Stay (HOSPITAL_COMMUNITY)
Admission: AD | Admit: 2018-11-09 | Discharge: 2018-11-09 | Disposition: A | Discharge status: Home / Self Care | Payer: Managed Care, Other (non HMO) | Attending: Obstetrics and Gynecology | Admitting: Obstetrics and Gynecology

## 2018-11-09 ENCOUNTER — Encounter (HOSPITAL_COMMUNITY): Payer: Self-pay

## 2018-11-09 ENCOUNTER — Other Ambulatory Visit: Payer: Self-pay

## 2018-11-09 DIAGNOSIS — Z3A20 20 weeks gestation of pregnancy: Secondary | ICD-10-CM

## 2018-11-09 DIAGNOSIS — F17210 Nicotine dependence, cigarettes, uncomplicated: Secondary | ICD-10-CM | POA: Insufficient documentation

## 2018-11-09 DIAGNOSIS — R1030 Lower abdominal pain, unspecified: Secondary | ICD-10-CM | POA: Insufficient documentation

## 2018-11-09 DIAGNOSIS — R109 Unspecified abdominal pain: Secondary | ICD-10-CM

## 2018-11-09 DIAGNOSIS — O26892 Other specified pregnancy related conditions, second trimester: Secondary | ICD-10-CM | POA: Insufficient documentation

## 2018-11-09 DIAGNOSIS — O99332 Smoking (tobacco) complicating pregnancy, second trimester: Secondary | ICD-10-CM | POA: Insufficient documentation

## 2018-11-09 LAB — URINALYSIS, ROUTINE W REFLEX MICROSCOPIC
Bilirubin Urine: NEGATIVE
Glucose, UA: NEGATIVE mg/dL
Hgb urine dipstick: NEGATIVE
Ketones, ur: 5 mg/dL — AB
Nitrite: NEGATIVE
Protein, ur: 30 mg/dL — AB
Specific Gravity, Urine: 1.026 (ref 1.005–1.030)
Squamous Epithelial / HPF: >50 — ABNORMAL HIGH (ref 0–5)
pH: 5 (ref 5.0–8.0)

## 2018-11-09 LAB — WET PREP, GENITAL
Clue Cells Wet Prep HPF POC: NONE SEEN
Sperm: NONE SEEN
Trich, Wet Prep: NONE SEEN
Yeast Wet Prep HPF POC: NONE SEEN

## 2018-11-09 LAB — POCT PREGNANCY, URINE: Preg Test, Ur: POSITIVE — AB

## 2018-11-09 NOTE — MAU Provider Note (Signed)
Chief Complaint: Abdominal Pain   First Provider Initiated Contact with Patient 11/09/18 1158     SUBJECTIVE HPI: Sharon [name] is a 28 y.o. G2P1001 at Unknown gestation who presents to Maternity Admissions reporting abdominal pain. Had a bleeding episode in February but doesn't know if it was her period b/c she has irregular periods. 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  Reports intermittent lower abdominal pain for the last week. Denies vaginal bleeding, LOF, n/v/d, constipation, or dysuria.   Location: lower abdomen Quality: cramping Severity: 4/10 on pain scale Duration: 1 week Timing: intermittent Modifying factors: none Associated signs and symptoms: none  Past Medical History:  Diagnosis Date  . Anxiety    no meds  . Arthritis    right lower arm tendonitis  . Carpal tunnel syndrome   . Headache(784.0)    otc med prn   OB History  Gravida Para Term Preterm AB Living  SAB TAB Ectopic Multiple Live Births          1    # Outcome Date GA Lbr Len/2nd Weight Sex Delivery Anes PTL Lv  2 Current           1 Term 05/16/12 [redacted]w[redacted]d  2590 g M CS-LTranv Spinal  LIV    Obstetric Comments  1 st pregnancy , unplanned   Past Surgical History:  Procedure Laterality Date  . CESAREAN SECTION  05/16/2012   Procedure: CESAREAN SECTION;  Surgeon: Brock Bad, MD;  Location: [facility];  Service: Obstetrics;  Laterality: N/A;  Primary  . FOOT SURGERY  1997   removal foreign body (sewing needle)  . TOOTH EXTRACTION  2008   upper incisor x2   Social History   Socioeconomic History  . Marital status: Single    Spouse name: Not on file  . Number of children: Not on file  . Years of education: Not on file  . Highest education level: Not on file  Occupational History  . Occupation: Waitress  Social Needs  . Financial resource strain: Not on file  . Food insecurity:    Worry: Not on file    Inability: Not on file  . Transportation  needs:    Medical: Not on file    Non-medical: Not on file  Tobacco Use  . Smoking status: Current Every Day Smoker    Packs/day: 1.00    Years: 4.00    Pack years: 4.00    Types: Cigarettes  . Smokeless tobacco: Former Engineer, water and Sexual Activity  . Alcohol use: No    Comment: ocassionaly   . Drug use: Yes    Types: Marijuana, Other-see comments, Oxycodone    Comment: hx - weekly vicodin use  . Sexual activity: Yes    Partners: Male    Birth control/protection: None, Condom  Lifestyle  . Physical activity:    Days per week: Not on file    Minutes per session: Not on file  . Stress: Not on file  Relationships  . Social connections:    Talks on phone: Not on file    Gets together: Not on file    Attends religious service: Not on file    Active member of club or organization: Not on file    Attends meetings of clubs or organizations: Not on file    Relationship status: Not on file  . Intimate partner violence:    Fear of  current or ex partner: Not on file    Emotionally abused: Not on file    Physically abused: Not on file    Forced sexual activity: Not on file  Other Topics Concern  . Not on file  Social History Narrative  . Not on file   Family History  Problem Relation Age of Onset  . Seizures Father   . Diabetes Other   . CAD Other   . Hypertension Other   . Anesthesia problems Neg Hx   . Hypotension Neg Hx   . Malignant hyperthermia Neg Hx   . Pseudochol deficiency Neg Hx    No current facility-administered medications on file prior to encounter.    No current outpatient medications on file prior to encounter.   No Known Allergies  I have reviewed patient's Past Medical Hx, Surgical Hx, Family Hx, Social Hx, medications and allergies.   Review of Systems  Constitutional: Negative.   Gastrointestinal: Positive for abdominal pain.  Genitourinary: Negative.     OBJECTIVE Patient Vitals for the past 24 hrs:  BP Temp Temp src Pulse Resp SpO2  Height Weight  11/09/18 1338 (!) 115/58 - - 75 - - - -  11/09/18 1115 110/65 98 F (36.7 C) Oral 89 18 100 % - -  11/09/18 1109 - - - - - - 5\' 2"  (1.575 m) 85.1 kg   Constitutional: Well-developed, well-nourished female in no acute distress.  Cardiovascular: normal rate & rhythm, no murmur Respiratory: normal rate and effort. Lung sounds clear throughout GI: Fundal height 22 cm. Abd soft, non-tender, Pos BS x 4. No guarding or rebound tenderness MS: Extremities nontender, no edema, normal ROM Neurologic: Alert and oriented x 4.  GU:  Cervix closed/thick. No blood.    LAB RESULTS Results for orders placed or performed during the hospital encounter of 11/09/18 (from the past 24 hour(s))  Pregnancy, urine POC     Status: Abnormal   Collection Time: 11/09/18 10:59 AM  Result Value Ref Range   Preg Test, Ur POSITIVE (A) NEGATIVE  Urinalysis, Routine w reflex microscopic     Status: Abnormal   Collection Time: 11/09/18 11:00 AM  Result Value Ref Range   Color, Urine AMBER (A) YELLOW   APPearance CLOUDY (A) CLEAR   Specific Gravity, Urine 1.026 1.005 - 1.030   pH 5.0 5.0 - 8.0   Glucose, UA NEGATIVE NEGATIVE mg/dL   Hgb urine dipstick NEGATIVE NEGATIVE   Bilirubin Urine NEGATIVE NEGATIVE   Ketones, ur 5 (A) NEGATIVE mg/dL   Protein, ur 30 (A) NEGATIVE mg/dL   Nitrite NEGATIVE NEGATIVE   Leukocytes,Ua TRACE (A) NEGATIVE   RBC / HPF 0-5 0 - 5 RBC/hpf   WBC, UA 0-5 0 - 5 WBC/hpf   Bacteria, UA MANY (A) NONE SEEN   Squamous Epithelial / LPF >50 (H) 0 - 5   Mucus PRESENT   Wet prep, genital     Status: Abnormal   Collection Time: 11/09/18 12:38 PM  Result Value Ref Range   Yeast Wet Prep HPF POC NONE SEEN NONE SEEN   Trich, Wet Prep NONE SEEN NONE SEEN   Clue Cells Wet Prep HPF POC NONE SEEN NONE SEEN   WBC, Wet Prep HPF POC FEW (A) NONE SEEN   Sperm NONE SEEN     IMAGING No results found.  MAU COURSE Orders Placed This Encounter  Procedures  . Culture, OB Urine  . Wet  prep, genital  . Urinalysis, Routine w reflex microscopic  .  Pregnancy, urine POC  . Discharge patient   No orders of the defined types were placed in this encounter.   MDM Fundal height at 22 cm. FHT present via doppler.   Pt informed that the ultrasound is considered a limited OB ultrasound and is not intended to be a complete ultrasound exam.  Patient also informed that the ultrasound is not being completed with the intent of assessing for fetal or placental anomalies or any pelvic abnormalities.  Explained that the purpose of today's ultrasound is to assess for  dating.  Patient acknowledges the purpose of the exam and the limitations of the study.  Live IUP in vertex position measuring 5379w6d by BPD.   [facility] 20-[redacted] wks [facility]. HR due to methadone. Instructed to start prenatal care asap. [facility] & would like to return to them.   ASSESSMENT 1. Abdominal pain during pregnancy in second trimester     PLAN Discharge home in stable condition. PTL precautions GC/CT pending Msg to [facility] for new ob appt  Follow-up Information    [facility] Follow up.   Specialty:  Obstetrics and Gynecology Contact information: [address], [address] [place] [phone]         Allergies as of 11/09/2018   No Known Allergies     Medication List    STOP taking these medications   azithromycin 250 MG tablet Commonly known as:  ZITHROMAX   benzonatate 100 MG capsule Commonly known as:  TESSALON   ondansetron 4 MG disintegrating tablet Commonly known as:  Zofran ODT        Judeth HornLawrence, Dennis Hegeman, NP 11/09/2018  4:12 PM

## 2018-11-09 NOTE — MAU Note (Signed)
Sharon [name] is a 28 y.o. at Unknown here in MAU reporting: abdominal cramping for the past week, no bleeding  LMP: unknown, has irregular periods, heaving bleeding in February, no bleeding in march, spotting in April, unsure of dating, + UPT at methadone clinic about 3 weeks ago  Onset of complaint: ongoing for a week  Pain score: 4/10  Vitals:   11/09/18 1115  BP: 110/65  Pulse: 89  Resp: 18  Temp: 98 F (36.7 C)  SpO2: 100%      Lab orders placed from triage: UA, UPT

## 2018-11-09 NOTE — Discharge Instructions (Signed)

## 2018-11-10 LAB — CULTURE, OB URINE: Culture: <10000 — AB

## 2018-11-11 LAB — GC/CHLAMYDIA PROBE AMP (~~LOC~~) NOT AT ARMC
Chlamydia: NEGATIVE
Neisseria Gonorrhea: NEGATIVE

## 2018-11-12 ENCOUNTER — Telehealth: Payer: Self-pay

## 2018-11-12 NOTE — Telephone Encounter (Signed)
Called pt for New OB intake, no answer, left vm

## 2018-11-19 ENCOUNTER — Encounter: Payer: Self-pay | Admitting: Obstetrics and Gynecology

## 2019-04-02 ENCOUNTER — Inpatient Hospital Stay (HOSPITAL_COMMUNITY): Payer: Medicaid Other | Admitting: Anesthesiology

## 2019-04-02 ENCOUNTER — Encounter (HOSPITAL_COMMUNITY)
Admission: AD | Disposition: A | Discharge status: Home / Self Care | Payer: Self-pay | Source: Home / Self Care | Attending: Obstetrics & Gynecology

## 2019-04-02 ENCOUNTER — Encounter (HOSPITAL_COMMUNITY): Payer: Self-pay

## 2019-04-02 ENCOUNTER — Other Ambulatory Visit: Payer: Self-pay

## 2019-04-02 ENCOUNTER — Inpatient Hospital Stay (HOSPITAL_COMMUNITY)
Admission: AD | Admit: 2019-04-02 | Discharge: 2019-04-04 | DRG: 786 | Disposition: A | Discharge status: Home / Self Care | Payer: Medicaid Other | Attending: Obstetrics & Gynecology | Admitting: Obstetrics & Gynecology

## 2019-04-02 DIAGNOSIS — O322XX0 Maternal care for transverse and oblique lie, not applicable or unspecified: Secondary | ICD-10-CM | POA: Diagnosis present

## 2019-04-02 DIAGNOSIS — Z20828 Contact with and (suspected) exposure to other viral communicable diseases: Secondary | ICD-10-CM | POA: Diagnosis present

## 2019-04-02 DIAGNOSIS — O411230 Chorioamnionitis, third trimester, not applicable or unspecified: Secondary | ICD-10-CM | POA: Diagnosis present

## 2019-04-02 DIAGNOSIS — O480 Post-term pregnancy: Secondary | ICD-10-CM | POA: Diagnosis present

## 2019-04-02 DIAGNOSIS — Z3A41 41 weeks gestation of pregnancy: Secondary | ICD-10-CM | POA: Diagnosis not present

## 2019-04-02 DIAGNOSIS — O368130 Decreased fetal movements, third trimester, not applicable or unspecified: Principal | ICD-10-CM | POA: Diagnosis present

## 2019-04-02 DIAGNOSIS — O34211 Maternal care for low transverse scar from previous cesarean delivery: Secondary | ICD-10-CM | POA: Diagnosis present

## 2019-04-02 DIAGNOSIS — O99323 Drug use complicating pregnancy, third trimester: Secondary | ICD-10-CM | POA: Diagnosis present

## 2019-04-02 DIAGNOSIS — F1910 Other psychoactive substance abuse, uncomplicated: Secondary | ICD-10-CM | POA: Diagnosis present

## 2019-04-02 DIAGNOSIS — Z98891 History of uterine scar from previous surgery: Secondary | ICD-10-CM

## 2019-04-02 DIAGNOSIS — O99324 Drug use complicating childbirth: Secondary | ICD-10-CM | POA: Diagnosis present

## 2019-04-02 DIAGNOSIS — O34219 Maternal care for unspecified type scar from previous cesarean delivery: Secondary | ICD-10-CM | POA: Diagnosis not present

## 2019-04-02 DIAGNOSIS — F1110 Opioid abuse, uncomplicated: Secondary | ICD-10-CM | POA: Diagnosis not present

## 2019-04-02 DIAGNOSIS — F1120 Opioid dependence, uncomplicated: Secondary | ICD-10-CM | POA: Diagnosis present

## 2019-04-02 LAB — WET PREP, GENITAL
Clue Cells Wet Prep HPF POC: NONE SEEN
Sperm: NONE SEEN
Trich, Wet Prep: NONE SEEN
Yeast Wet Prep HPF POC: NONE SEEN

## 2019-04-02 LAB — GROUP B STREP BY PCR: Group B strep by PCR: NEGATIVE

## 2019-04-02 LAB — SARS CORONAVIRUS 2 BY RT PCR (HOSPITAL ORDER, PERFORMED IN ~~LOC~~ HOSPITAL LAB): SARS Coronavirus 2: NEGATIVE

## 2019-04-02 SURGERY — Surgical Case
Anesthesia: General

## 2019-04-02 MED ORDER — LIDOCAINE 2% (20 MG/ML) 5 ML SYRINGE
INTRAMUSCULAR | Status: AC
  Filled 2019-04-02: qty 5

## 2019-04-02 MED ORDER — ACETAMINOPHEN 10 MG/ML IV SOLN: 1000.0000 mg | Freq: Once | INTRAVENOUS | Status: DC | PRN

## 2019-04-02 MED ORDER — PROPOFOL 10 MG/ML IV BOLUS
INTRAVENOUS | Status: AC
  Filled 2019-04-02: qty 20

## 2019-04-02 MED ORDER — CEFAZOLIN SODIUM-DEXTROSE 2-4 GM/100ML-% IV SOLN
INTRAVENOUS | Status: AC
  Filled 2019-04-02: qty 100

## 2019-04-02 MED ORDER — SODIUM CHLORIDE 0.9 % IV SOLN
INTRAVENOUS | Status: DC | PRN
  Administered 2019-04-02: 22:00:00 via INTRAVENOUS

## 2019-04-02 MED ORDER — SODIUM CHLORIDE 0.9 % IV SOLN
INTRAVENOUS | Status: DC | PRN
  Administered 2019-04-02: 40 [IU] via INTRAVENOUS

## 2019-04-02 MED ORDER — KETOROLAC TROMETHAMINE 30 MG/ML IJ SOLN: 30.0000 mg | Freq: Once | INTRAMUSCULAR | Status: AC | PRN

## 2019-04-02 MED ORDER — ONDANSETRON HCL 4 MG/2ML IJ SOLN
INTRAMUSCULAR | Status: AC
  Filled 2019-04-02: qty 2

## 2019-04-02 MED ORDER — LACTATED RINGERS IV BOLUS
1000.0000 mL | Freq: Once | INTRAVENOUS | Status: AC
  Administered 2019-04-02: 1000 mL via INTRAVENOUS

## 2019-04-02 MED ORDER — FENTANYL CITRATE (PF) 250 MCG/5ML IJ SOLN
INTRAMUSCULAR | Status: AC
  Filled 2019-04-02: qty 5

## 2019-04-02 MED ORDER — OXYCODONE HCL 5 MG PO TABS: 5.0000 mg | ORAL_TABLET | Freq: Once | ORAL | Status: DC | PRN

## 2019-04-02 MED ORDER — ONDANSETRON HCL 4 MG/2ML IJ SOLN: 4.0000 mg | Freq: Once | INTRAMUSCULAR | Status: DC | PRN

## 2019-04-02 MED ORDER — ONDANSETRON HCL 4 MG/2ML IJ SOLN: INTRAMUSCULAR | Status: DC | PRN

## 2019-04-02 MED ORDER — ACETAMINOPHEN 10 MG/ML IV SOLN
INTRAVENOUS | Status: AC
  Filled 2019-04-02: qty 100

## 2019-04-02 MED ORDER — SUCCINYLCHOLINE CHLORIDE 200 MG/10ML IV SOSY
PREFILLED_SYRINGE | INTRAVENOUS | Status: DC | PRN
  Administered 2019-04-02: 130 mg via INTRAVENOUS

## 2019-04-02 MED ORDER — MIDAZOLAM HCL 2 MG/2ML IJ SOLN
INTRAMUSCULAR | Status: AC
  Filled 2019-04-02: qty 2

## 2019-04-02 MED ORDER — MIDAZOLAM HCL 5 MG/5ML IJ SOLN
INTRAMUSCULAR | Status: DC | PRN
  Administered 2019-04-02: 2 mg via INTRAVENOUS

## 2019-04-02 MED ORDER — SUCCINYLCHOLINE CHLORIDE 200 MG/10ML IV SOSY
PREFILLED_SYRINGE | INTRAVENOUS | Status: AC
  Filled 2019-04-02: qty 10

## 2019-04-02 MED ORDER — KETAMINE HCL 10 MG/ML IJ SOLN
INTRAMUSCULAR | Status: DC | PRN
  Administered 2019-04-02: 30 mg via INTRAVENOUS

## 2019-04-02 MED ORDER — PROPOFOL 10 MG/ML IV BOLUS
INTRAVENOUS | Status: DC | PRN
  Administered 2019-04-02: 200 mg via INTRAVENOUS

## 2019-04-02 MED ORDER — HYDROMORPHONE HCL 1 MG/ML IJ SOLN
0.2500 mg | INTRAMUSCULAR | Status: DC | PRN
  Administered 2019-04-02 – 2019-04-03 (×2): 0.5 mg via INTRAVENOUS

## 2019-04-02 MED ORDER — DEXAMETHASONE SODIUM PHOSPHATE 4 MG/ML IJ SOLN
INTRAMUSCULAR | Status: DC | PRN
  Administered 2019-04-02: 4 mg via INTRAVENOUS

## 2019-04-02 MED ORDER — CEFAZOLIN SODIUM-DEXTROSE 2-3 GM-%(50ML) IV SOLR
INTRAVENOUS | Status: DC | PRN
  Administered 2019-04-02: 2 g via INTRAVENOUS

## 2019-04-02 MED ORDER — OXYTOCIN 40 UNITS IN NORMAL SALINE INFUSION - SIMPLE MED
INTRAVENOUS | Status: AC
  Filled 2019-04-02: qty 1000

## 2019-04-02 MED ORDER — DEXAMETHASONE SODIUM PHOSPHATE 10 MG/ML IJ SOLN
INTRAMUSCULAR | Status: AC
  Filled 2019-04-02: qty 1

## 2019-04-02 MED ORDER — HYDROMORPHONE HCL 1 MG/ML IJ SOLN
INTRAMUSCULAR | Status: AC
  Filled 2019-04-02: qty 1

## 2019-04-02 MED ORDER — ONDANSETRON HCL 4 MG/2ML IJ SOLN
INTRAMUSCULAR | Status: DC | PRN
  Administered 2019-04-02: 4 mg via INTRAVENOUS

## 2019-04-02 MED ORDER — FENTANYL CITRATE (PF) 100 MCG/2ML IJ SOLN
INTRAMUSCULAR | Status: DC | PRN
  Administered 2019-04-02: 100 ug via INTRAVENOUS
  Administered 2019-04-02: 150 ug via INTRAVENOUS
  Administered 2019-04-02: 250 ug via INTRAVENOUS

## 2019-04-02 MED ORDER — OXYCODONE HCL 5 MG/5ML PO SOLN: 5.0000 mg | Freq: Once | ORAL | Status: DC | PRN

## 2019-04-02 MED ORDER — LACTATED RINGERS IV SOLN
INTRAVENOUS | Status: DC | PRN
  Administered 2019-04-02 (×2): via INTRAVENOUS

## 2019-04-02 SURGICAL SUPPLY — 40 items
ADH SKN CLS APL DERMABOND .7 (GAUZE/BANDAGES/DRESSINGS) ×1
CHLORAPREP W/TINT 26ML (MISCELLANEOUS) ×3 IMPLANT
CLAMP CORD UMBIL (MISCELLANEOUS) IMPLANT
CLOTH BEACON ORANGE TIMEOUT ST (SAFETY) ×3 IMPLANT
DERMABOND ADVANCED (GAUZE/BANDAGES/DRESSINGS) ×2
DERMABOND ADVANCED .7 DNX12 (GAUZE/BANDAGES/DRESSINGS) ×2 IMPLANT
DRSG OPSITE POSTOP 4X10 (GAUZE/BANDAGES/DRESSINGS) ×3 IMPLANT
ELECT REM PT RETURN 9FT ADLT (ELECTROSURGICAL) ×3
ELECTRODE REM PT RTRN 9FT ADLT (ELECTROSURGICAL) ×1 IMPLANT
EXTRACTOR VACUUM BELL STYLE (SUCTIONS) IMPLANT
GLOVE BIOGEL PI IND STRL 7.0 (GLOVE) ×1 IMPLANT
GLOVE BIOGEL PI IND STRL 8 (GLOVE) ×1 IMPLANT
GLOVE BIOGEL PI INDICATOR 7.0 (GLOVE) ×2
GLOVE BIOGEL PI INDICATOR 8 (GLOVE) ×2
GLOVE ECLIPSE 8.0 STRL XLNG CF (GLOVE) ×3 IMPLANT
GOWN STRL REUS W/TWL LRG LVL3 (GOWN DISPOSABLE) ×6 IMPLANT
KIT ABG SYR 3ML LUER SLIP (SYRINGE) ×3 IMPLANT
NDL HYPO 18GX1.5 BLUNT FILL (NEEDLE) ×1 IMPLANT
NDL HYPO 25X5/8 SAFETYGLIDE (NEEDLE) ×1 IMPLANT
NEEDLE HYPO 18GX1.5 BLUNT FILL (NEEDLE) ×3 IMPLANT
NEEDLE HYPO 22GX1.5 SAFETY (NEEDLE) ×3 IMPLANT
NEEDLE HYPO 25X5/8 SAFETYGLIDE (NEEDLE) ×3 IMPLANT
NS IRRIG 1000ML POUR BTL (IV SOLUTION) ×3 IMPLANT
PACK C SECTION WH (CUSTOM PROCEDURE TRAY) ×3 IMPLANT
PAD OB MATERNITY 4.3X12.25 (PERSONAL CARE ITEMS) ×3 IMPLANT
PENCIL SMOKE EVAC W/HOLSTER (ELECTROSURGICAL) ×3 IMPLANT
RTRCTR C-SECT PINK 25CM LRG (MISCELLANEOUS) IMPLANT
SUT CHROMIC 0 CT 1 (SUTURE) ×3 IMPLANT
SUT MNCRL 0 VIOLET CTX 36 (SUTURE) ×2 IMPLANT
SUT MONOCRYL 0 CTX 36 (SUTURE) ×4
SUT PLAIN 2 0 (SUTURE)
SUT PLAIN 2 0 XLH (SUTURE) IMPLANT
SUT PLAIN ABS 2-0 CT1 27XMFL (SUTURE) IMPLANT
SUT VIC AB 0 CTX 36 (SUTURE) ×3
SUT VIC AB 0 CTX36XBRD ANBCTRL (SUTURE) ×1 IMPLANT
SUT VIC AB 4-0 KS 27 (SUTURE) IMPLANT
SYR 20CC LL (SYRINGE) ×6 IMPLANT
TOWEL OR 17X24 6PK STRL BLUE (TOWEL DISPOSABLE) ×3 IMPLANT
TRAY FOLEY W/BAG SLVR 14FR LF (SET/KITS/TRAYS/PACK) IMPLANT
WATER STERILE IRR 1000ML POUR (IV SOLUTION) ×3 IMPLANT

## 2019-04-02 NOTE — Anesthesia Preprocedure Evaluation (Signed)
Anesthesia Evaluation   Patient awake    Reviewed: Allergy & PrecautionsPreop documentation limited or incomplete due to emergent nature of procedure.  Airway        Dental   Pulmonary Current Smoker,    Pulmonary exam normal        Cardiovascular  Rhythm:Regular     Neuro/Psych    GI/Hepatic   Endo/Other    Renal/GU      Musculoskeletal   Abdominal   Peds  Hematology   Anesthesia Other Findings STAT C/S for non- reassuring FHT- emergent hx only. Pt reported no allergies, no serious health problems, no h/o anesthetic complications.  Reproductive/Obstetrics                             Anesthesia Physical Anesthesia Plan  ASA: III and emergent  Anesthesia Plan: General   Post-op Pain Management:    Induction: Intravenous  PONV Risk Score and Plan: 4 or greater and Ondansetron, Dexamethasone, Treatment may vary due to age or medical condition and Midazolam  Airway Management Planned: Oral ETT  Additional Equipment: None  Intra-op Plan:   Post-operative Plan: Extubation in OR  Informed Consent:     Only emergency history available  Plan Discussed with:   Anesthesia Plan Comments:         Anesthesia Quick Evaluation

## 2019-04-02 NOTE — Transfer of Care (Signed)
Immediate Anesthesia Transfer of Care Note  Patient: Sharon [name]  Procedure(s) Performed: CESAREAN SECTION (N/A )  Patient Location: PACU  Anesthesia Type:General  Level of Consciousness: awake, alert , oriented and patient cooperative  Airway & Oxygen Therapy: Patient Spontanous Breathing and Patient connected to face mask oxygen  Post-op Assessment: Report given to RN, Post -op Vital signs reviewed and stable and Patient moving all extremities X 4  Post vital signs: Reviewed and stable  Last Vitals:  Vitals Value Taken Time  BP 121/79 04/02/19 2330  Temp 36.6 C 04/02/19 2330  Pulse 87 04/02/19 2332  Resp 24 04/02/19 2332  SpO2 100 % 04/02/19 2332  Vitals shown include unvalidated device data.  Last Pain:  Vitals:   04/02/19 2330  TempSrc: Axillary  PainSc: (P) Asleep      Patients Stated Pain Goal: 0 (82/42/35 3614)  Complications: No apparent anesthesia complications

## 2019-04-03 ENCOUNTER — Encounter (HOSPITAL_COMMUNITY): Payer: Self-pay | Admitting: *Deleted

## 2019-04-03 DIAGNOSIS — F1110 Opioid abuse, uncomplicated: Secondary | ICD-10-CM

## 2019-04-03 DIAGNOSIS — O34219 Maternal care for unspecified type scar from previous cesarean delivery: Secondary | ICD-10-CM

## 2019-04-03 DIAGNOSIS — Z98891 History of uterine scar from previous surgery: Secondary | ICD-10-CM

## 2019-04-03 DIAGNOSIS — O322XX0 Maternal care for transverse and oblique lie, not applicable or unspecified: Secondary | ICD-10-CM

## 2019-04-03 DIAGNOSIS — F1120 Opioid dependence, uncomplicated: Secondary | ICD-10-CM | POA: Diagnosis present

## 2019-04-03 DIAGNOSIS — O99324 Drug use complicating childbirth: Secondary | ICD-10-CM

## 2019-04-03 DIAGNOSIS — O480 Post-term pregnancy: Secondary | ICD-10-CM

## 2019-04-03 DIAGNOSIS — O99323 Drug use complicating pregnancy, third trimester: Secondary | ICD-10-CM | POA: Diagnosis present

## 2019-04-03 DIAGNOSIS — Z3A41 41 weeks gestation of pregnancy: Secondary | ICD-10-CM

## 2019-04-03 LAB — COMPREHENSIVE METABOLIC PANEL
ALT: 9 U/L (ref 0–44)
AST: 20 U/L (ref 15–41)
Albumin: 1.7 g/dL — ABNORMAL LOW (ref 3.5–5.0)
Alkaline Phosphatase: 115 U/L (ref 38–126)
Anion gap: 9 (ref 5–15)
BUN: 9 mg/dL (ref 6–20)
CO2: 25 mmol/L (ref 22–32)
Calcium: 7.9 mg/dL — ABNORMAL LOW (ref 8.9–10.3)
Chloride: 103 mmol/L (ref 98–111)
Creatinine, Ser: 0.76 mg/dL (ref 0.44–1.00)
GFR calc Af Amer: >60 mL/min (ref >60)
GFR calc non Af Amer: >60 mL/min (ref >60)
Glucose, Bld: 129 mg/dL — ABNORMAL HIGH (ref 70–99)
Potassium: 3.9 mmol/L (ref 3.5–5.1)
Sodium: 137 mmol/L (ref 135–145)
Total Bilirubin: 0.5 mg/dL (ref 0.3–1.2)
Total Protein: 4.7 g/dL — ABNORMAL LOW (ref 6.5–8.1)

## 2019-04-03 LAB — CBC
HCT: 31.3 % — ABNORMAL LOW (ref 36.0–46.0)
Hemoglobin: 9.7 g/dL — ABNORMAL LOW (ref 12.0–15.0)
MCH: 26.8 pg (ref 26.0–34.0)
MCHC: 31 g/dL (ref 30.0–36.0)
MCV: 86.5 fL (ref 80.0–100.0)
Platelets: 305 10*3/uL (ref 150–400)
RBC: 3.62 MIL/uL — ABNORMAL LOW (ref 3.87–5.11)
RDW: 15.8 % — ABNORMAL HIGH (ref 11.5–15.5)
WBC: 31.2 10*3/uL — ABNORMAL HIGH (ref 4.0–10.5)
nRBC: 0 % (ref 0.0–0.2)

## 2019-04-03 LAB — HIV ANTIBODY (ROUTINE TESTING W REFLEX): HIV Screen 4th Generation wRfx: NONREACTIVE

## 2019-04-03 LAB — HEPATITIS B SURFACE ANTIGEN: Hepatitis B Surface Ag: NONREACTIVE

## 2019-04-03 LAB — TYPE AND SCREEN
ABO/RH(D): A POS
Antibody Screen: NEGATIVE

## 2019-04-03 LAB — RPR: RPR Ser Ql: NONREACTIVE

## 2019-04-03 LAB — ABO/RH: ABO/RH(D): A POS

## 2019-04-03 MED ORDER — PRENATAL MULTIVITAMIN CH
1.0000 | ORAL_TABLET | Freq: Every day | ORAL | Status: DC
  Administered 2019-04-03 – 2019-04-04 (×2): 1 via ORAL
  Filled 2019-04-03 (×2): qty 1

## 2019-04-03 MED ORDER — SIMETHICONE 80 MG PO CHEW: 80.0000 mg | CHEWABLE_TABLET | ORAL | Status: DC | PRN

## 2019-04-03 MED ORDER — SIMETHICONE 80 MG PO CHEW
80.0000 mg | CHEWABLE_TABLET | ORAL | Status: DC
  Administered 2019-04-03: 80 mg via ORAL
  Filled 2019-04-03: qty 1

## 2019-04-03 MED ORDER — MENTHOL 3 MG MT LOZG: 1.0000 | LOZENGE | OROMUCOSAL | Status: DC | PRN

## 2019-04-03 MED ORDER — SENNOSIDES-DOCUSATE SODIUM 8.6-50 MG PO TABS
2.0000 | ORAL_TABLET | ORAL | Status: DC
  Administered 2019-04-03: 2 via ORAL
  Filled 2019-04-03: qty 2

## 2019-04-03 MED ORDER — LACTATED RINGERS IV SOLN
INTRAVENOUS | Status: DC
  Administered 2019-04-03: 07:00:00 via INTRAVENOUS

## 2019-04-03 MED ORDER — DIBUCAINE (PERIANAL) 1 % EX OINT: 1.0000 "application " | TOPICAL_OINTMENT | CUTANEOUS | Status: DC | PRN

## 2019-04-03 MED ORDER — TETANUS-DIPHTH-ACELL PERTUSSIS 5-2.5-18.5 LF-MCG/0.5 IM SUSP: 0.5000 mL | Freq: Once | INTRAMUSCULAR | Status: DC

## 2019-04-03 MED ORDER — ZOLPIDEM TARTRATE 5 MG PO TABS: 5.0000 mg | ORAL_TABLET | Freq: Every evening | ORAL | Status: DC | PRN

## 2019-04-03 MED ORDER — KETOROLAC TROMETHAMINE 30 MG/ML IJ SOLN
30.0000 mg | Freq: Four times a day (QID) | INTRAMUSCULAR | Status: DC
  Administered 2019-04-03: 30 mg via INTRAVENOUS
  Filled 2019-04-03: qty 1

## 2019-04-03 MED ORDER — KETOROLAC TROMETHAMINE 30 MG/ML IJ SOLN
INTRAMUSCULAR | Status: AC
  Filled 2019-04-03: qty 1

## 2019-04-03 MED ORDER — LACTATED RINGERS IV BOLUS
500.0000 mL | Freq: Once | INTRAVENOUS | Status: AC
  Administered 2019-04-03: 500 mL via INTRAVENOUS

## 2019-04-03 MED ORDER — IBUPROFEN 600 MG PO TABS
600.0000 mg | ORAL_TABLET | Freq: Four times a day (QID) | ORAL | Status: DC
  Administered 2019-04-03 – 2019-04-04 (×3): 600 mg via ORAL
  Filled 2019-04-03 (×3): qty 1

## 2019-04-03 MED ORDER — FENTANYL CITRATE (PF) 100 MCG/2ML IJ SOLN: 50.0000 ug | INTRAMUSCULAR | Status: DC | PRN

## 2019-04-03 MED ORDER — HYDROMORPHONE HCL 1 MG/ML IJ SOLN
INTRAMUSCULAR | Status: AC
  Filled 2019-04-03: qty 1

## 2019-04-03 MED ORDER — IBUPROFEN 800 MG PO TABS: 800.0000 mg | ORAL_TABLET | Freq: Four times a day (QID) | ORAL | Status: DC

## 2019-04-03 MED ORDER — GABAPENTIN 100 MG PO CAPS
100.0000 mg | ORAL_CAPSULE | Freq: Three times a day (TID) | ORAL | Status: DC
  Administered 2019-04-03 – 2019-04-04 (×4): 100 mg via ORAL
  Filled 2019-04-03 (×4): qty 1

## 2019-04-03 MED ORDER — ALPRAZOLAM 1 MG PO TABS: 1.0000 mg | ORAL_TABLET | Freq: Every evening | ORAL | Status: DC | PRN

## 2019-04-03 MED ORDER — ENOXAPARIN SODIUM 60 MG/0.6ML ~~LOC~~ SOLN
0.5000 mg/kg | SUBCUTANEOUS | Status: DC
  Administered 2019-04-03 – 2019-04-04 (×2): 50 mg via SUBCUTANEOUS
  Filled 2019-04-03 (×2): qty 0.6

## 2019-04-03 MED ORDER — COCONUT OIL OIL: 1.0000 "application " | TOPICAL_OIL | Status: DC | PRN

## 2019-04-03 MED ORDER — OXYTOCIN 40 UNITS IN NORMAL SALINE INFUSION - SIMPLE MED: 2.5000 [IU]/h | INTRAVENOUS | Status: AC

## 2019-04-03 MED ORDER — WITCH HAZEL-GLYCERIN EX PADS: 1.0000 "application " | MEDICATED_PAD | CUTANEOUS | Status: DC | PRN

## 2019-04-03 MED ORDER — METHYLERGONOVINE MALEATE 0.2 MG PO TABS: 0.2000 mg | ORAL_TABLET | ORAL | Status: DC | PRN

## 2019-04-03 MED ORDER — NICOTINE 21 MG/24HR TD PT24
21.0000 mg | MEDICATED_PATCH | Freq: Every day | TRANSDERMAL | Status: DC
  Administered 2019-04-03 – 2019-04-04 (×2): 21 mg via TRANSDERMAL
  Filled 2019-04-03 (×2): qty 1

## 2019-04-03 MED ORDER — DIPHENHYDRAMINE HCL 25 MG PO CAPS: 25.0000 mg | ORAL_CAPSULE | Freq: Four times a day (QID) | ORAL | Status: DC | PRN

## 2019-04-03 MED ORDER — OXYCODONE HCL 5 MG PO TABS
5.0000 mg | ORAL_TABLET | ORAL | Status: DC | PRN
  Administered 2019-04-03: 5 mg via ORAL
  Filled 2019-04-03: qty 1

## 2019-04-03 MED ORDER — KETOROLAC TROMETHAMINE 30 MG/ML IJ SOLN
30.0000 mg | Freq: Four times a day (QID) | INTRAMUSCULAR | Status: AC
  Administered 2019-04-03 (×2): 30 mg via INTRAVENOUS
  Filled 2019-04-03 (×2): qty 1

## 2019-04-03 MED ORDER — METHADONE HCL 10 MG PO TABS
80.0000 mg | ORAL_TABLET | Freq: Every day | ORAL | Status: DC
  Administered 2019-04-03 – 2019-04-04 (×2): 80 mg via ORAL
  Filled 2019-04-03 (×2): qty 8

## 2019-04-03 MED ORDER — METHYLERGONOVINE MALEATE 0.2 MG/ML IJ SOLN: 0.2000 mg | INTRAMUSCULAR | Status: DC | PRN

## 2019-04-03 MED ORDER — SODIUM CHLORIDE 0.9 % IV SOLN
3.0000 g | Freq: Four times a day (QID) | INTRAVENOUS | Status: AC
  Administered 2019-04-03 – 2019-04-04 (×4): 3 g via INTRAVENOUS
  Filled 2019-04-03: qty 3
  Filled 2019-04-03: qty 8
  Filled 2019-04-03 (×3): qty 3

## 2019-04-03 MED ORDER — SIMETHICONE 80 MG PO CHEW
80.0000 mg | CHEWABLE_TABLET | Freq: Three times a day (TID) | ORAL | Status: DC
  Administered 2019-04-03 – 2019-04-04 (×4): 80 mg via ORAL
  Filled 2019-04-03 (×4): qty 1

## 2019-04-03 NOTE — Op Note (Signed)
Preoperative diagnosis:  1.  IUP [redacted]w[redacted]d G2P1001                                          2.  No prenatal care                                         3.  Previous cesarean section                                         4.  Chronic methadone therapy                                         5.  Fetal bradycardia                                         6.  Advanced labor with fetus in the transverse position                                                and an arm coming through the cervix                                         7.  Meconium stained amniotic fluid  Postoperative diagnosis: Same as above plus suggestion of possible prolonged rupture with fetal deformation noted as well as foul smelling amniotic fluid and umbilical cord that was extremely friable  Procedure: Emergency cesarean section, low transverse Anesthesia: General endotracheal  Findings: Patient presented to the maternity admissions unit with complaint of decreased fetal movement.  She was placed on the monitor at 2136.  At approximately 2156 the fetal heart rate was noted to be in the 70s.  I was called at 2204 and confirmed a heart rate in the 70s but it did briefly come up into the 1 teens.  However it went back down in the 70s again very quickly.  Additionally the patient is a previous C-section the baby was in the transverse lie she was to 6 to 7 cm dilated and there was a hand coming out of the cervix.  With a heart rate back in the 70s again I called a emergency stat code cesarean section at 2212.  I delivered a viable female infant at 2226 with Apgars of 1 6 and 7.  Cord pH was 7.099 PCO2 was 83 and bicarb was 24.7 suggesting a pure respiratory acidosis.  The amniotic cavity was malodorous as was the fetus there was meconium staining in the umbilical cord was very friable.  Uterus tubes and ovaries were otherwise normal  Description of operation: Patient was taken emergently from the maternity admissions unit to the  operating room where she underwent general endotracheal anesthesia She had been prepped and draped in emergent  fashion with a splash prep and a Foley catheter being placed prior to induction of anesthesia.  A low transverse hysterotomy incision was made carried down sharply the rectus fascia which was scored in the midline extended laterally The fascia was taken off the muscle superiorly and inferior without difficulty and the muscles were divided The peritoneal cavity was entered bluntly And the rectus muscles were pulled apart A low transverse hysterotomy incision was made and actually converted the fetus to a deliver the arm through the cervix and deliver the baby is a double footling breech extraction without difficulty Again the foul smell was noted Cord blood and cord gas were sent As stated above the cord gas was arterial pH was 7.099 PCO2 of 83 and bicarb of 24.7 again consistent with a respiratory acidosis The NICU staff was in attendance for appropriate resuscitative efforts Dr. Barbaraann Rondo noted that there was some deformation of the face that was consistent with potentially oligohydramnios and/or prolonged rupture The placenta was delivered spontaneously and sent the pathology for evaluation The uterus was exteriorized and closed in 2 layers the first being a running interlocking layer the second being imbricating layer The uterus was replaced the peritoneal cavity and the pelvis was irrigated vigorously The muscles and peritoneum were reapproximated loosely The fascia was closed using 0 Vicryl running The subcutaneous tissue was made hemostatic The skin was closed using 4-0 Vicryl and a Keith needle in a subcuticular fashion with good reapproximation and Dermabond Honeycomb dressing was placed I was unable to find estimated blood loss for the procedure but I estimated to be 750 cc Patient was awakened from anesthesia taken covering good stable condition all counts were correct  x3  Florian Buff, MD 04/03/2019 12:58 AM

## 2019-04-03 NOTE — Progress Notes (Signed)
Notified Manya Silvas, CNM of decreased urine output from foley catheter. Between 0700-0930 200 ml out and between 0930-1415 100 ml out. Appearing more concentrated in foley bag. Enc. patient to drink more water but still not drinking much. Orders taken from [place], CNM for 500 ml IVF bolus, CMET, and push PO fluids. Will continue to monitor output in foley.

## 2019-04-03 NOTE — Anesthesia Postprocedure Evaluation (Signed)
Anesthesia Post Note  Patient: Sharon [name]  Procedure(s) Performed: CESAREAN SECTION (N/A )     Patient location during evaluation: PACU Anesthesia Type: General Level of consciousness: awake and alert Pain management: pain level controlled Vital Signs Assessment: post-procedure vital signs reviewed and stable Respiratory status: spontaneous breathing, nonlabored ventilation and respiratory function stable Cardiovascular status: blood pressure returned to baseline and stable Postop Assessment: no apparent nausea or vomiting Anesthetic complications: no    Last Vitals:  Vitals:   04/02/19 2325 04/02/19 2330  BP: 121/76 121/79  Pulse:  87  Resp:  (!) 25  Temp:  36.6 C  SpO2: 100% 100%    Last Pain:  Vitals:   04/02/19 2330  TempSrc: Axillary  PainSc: Asleep   Pain Goal: Patients Stated Pain Goal: 0 (04/02/19 2148)                 Lidia Collum

## 2019-04-03 NOTE — H&P (Signed)
Sharon [name] is a 28 y.o. female G2P1001 Estimated Date of Delivery: 03/23/19 [redacted]w[redacted]d presenting for decreased fetal movement.  She has had 1 visit to the MAU this pregnancy in May where a sonogram at the bedside revealed [redacted]w[redacted]d BPD.  The FHR was initially in the 140s but dropped to the 70s where it stayed.  I was called STAT and evaluation revealed pt is a previous C section, she was laboring and the cx was 6-7 cm with a fetal arm coming thru the cervix.  The FHR returned to the 70s and I called a STAT C section OB History    Gravida  2   Para  1   Term  1   Preterm      AB      Living  1     SAB      TAB      Ectopic      Multiple      Live Births  1        Obstetric Comments  1 st pregnancy , unplanned       Past Medical History:  Diagnosis Date  . Anxiety    no meds  . Arthritis    right lower arm tendonitis  . Carpal tunnel syndrome   . Headache(784.0)    otc med prn   Past Surgical History:  Procedure Laterality Date  . CESAREAN SECTION  05/16/2012   Procedure: CESAREAN SECTION;  Surgeon: Shelly Bombard, MD;  Location: [facility];  Service: Obstetrics;  Laterality: N/A;  Primary  . FOOT SURGERY  1997   removal foreign body (sewing needle)  . TOOTH EXTRACTION  2008   upper incisor x2   Family History: family history includes CAD in an other family member; Diabetes in an other family member; Hypertension in an other family member; Seizures in her father. Social History:  reports that she has been smoking cigarettes. She has a 4.00 pack-year smoking history. She has quit using smokeless tobacco. She reports current drug use. Drugs: Marijuana, Other-see comments, and Oxycodone. She reports that she does not drink alcohol.  NO PNC   Maternal Diabetes: unknown Genetic Screening: none Maternal Ultrasounds/Referrals: none Fetal Ultrasounds or other Referrals:  None Maternal Substance Abuse:  Yes:  Type: Methadone Significant Maternal Medications:  Meds  include: Other: methadone + xanax Significant Maternal Lab Results:  None Other Comments:  No PNC  ROS History Dilation: 6(6-7) Effacement (%): 80 Exam by:: Hansel Feinstein CNM Blood pressure 124/71, pulse 73, temperature 98.9 F (37.2 C), temperature source Oral, resp. rate 18, weight 95.3 kg, SpO2 98 %. Exam Physical Exam  Prenatal labs: ABO, Rh:   Antibody:   Rubella:   RPR:    HBsAg:    HIV:    GBS: --/NEGATIVE (10/07 2145)   Assessment/Plan: [redacted]w[redacted]d  No PNC Fetal bradycardia Transverse lie with fetal arm thru cervix Advanced labor  Proceeded with a STAT C section, GET   Florian Buff 04/03/2019, 4:19 AM

## 2019-04-03 NOTE — Progress Notes (Signed)
Subjective: Postpartum Day 1: Cesarean Delivery Patient reports incisional pain and tolerating PO.    Objective: Vital signs in last 24 hours: Temp:  [97.8 F (36.6 C)-99.4 F (37.4 C)] 98 F (36.7 C) (10/08 0659) Pulse Rate:  [73-98] 82 (10/08 0659) Resp:  [18-25] 20 (10/08 0659) BP: (115-128)/(71-79) 115/73 (10/08 0659) SpO2:  [98 %-100 %] 98 % (10/08 0659) Weight:  [95.3 kg] 95.3 kg (10/08 0300)  Physical Exam:  General: alert, cooperative and no distress Lochia: appropriate Uterine Fundus: firm Incision: healing well, no significant drainage DVT Evaluation: No evidence of DVT seen on physical exam.  SCDs on  Recent Labs    04/03/19 0500  HGB 9.7*  HCT 31.3*    Assessment/Plan: Status post Cesarean section. Doing well postoperatively.  History of polysubstance abuse, currently on Methadone treatment (80mg /d) NICU infant.  Continue current care. Social Work Consult  Hansel Feinstein 04/03/2019, 7:49 AM

## 2019-04-03 NOTE — Progress Notes (Signed)
Pt taken to NICU to see her baby Via wheelchair.Pt moved very slowly from bed to wheelchair 19 hrs post op. This RN encouraged pt to walk in halls or bedroom to progress her care and to drink more fluids due to low output, 500cc bolus administered on days. Pt was sleeping heavily during shift report. Grandmother at bedside.Discussed with Pt pain medication scheduled for 1800.

## 2019-04-04 LAB — SURGICAL PATHOLOGY

## 2019-04-04 LAB — RUBELLA SCREEN: Rubella: 3.89 index (ref >0.99)

## 2019-04-04 MED ORDER — IBUPROFEN 600 MG PO TABS: 600.0000 mg | ORAL_TABLET | Freq: Four times a day (QID) | ORAL | 1 refills | Status: DC

## 2019-04-04 MED ORDER — SENNOSIDES-DOCUSATE SODIUM 8.6-50 MG PO TABS: 2.0000 | ORAL_TABLET | ORAL | 0 refills | Status: DC

## 2019-04-04 NOTE — Discharge Summary (Signed)
OB Discharge Summary     Patient Name: Sharon [name] DOB: Aug 03, 1990 MRN: [id number]  Date of admission: 04/02/2019 Delivering MD: Duane Lope H   Date of discharge: 04/04/2019  Admitting diagnosis: CTX, brown mucus Intrauterine pregnancy: [redacted]w[redacted]d     Secondary diagnosis:  Active Problems:   S/P cesarean section   Methadone maintenance treatment complicating pregnancy, antepartum, third trimester [facility])      Discharge diagnosis: Term Pregnancy Delivered                                                                                                Post partum procedures:None  Augmentation: None  Complications: None and Intrauterine Inflammation or infection (Chorioamniotis)  Hospital course:  Onset of Labor With Unplanned C/S  28 y.o. yo [id number] at [redacted]w[redacted]d was admitted in Active Labor on 04/02/2019. The patient went for cesarean section due to Non-Reassuring FHR and fetal shoulder presentation, and delivered a Viable infant,04/02/2019  Details of operation can be found in separate operative note. Patient had an uncomplicated postpartum course.  She is ambulating,tolerating a regular diet, passing flatus, and urinating well.  Patient is discharged home in stable condition 04/04/19. Patient has a history of polysubstance abuse, on Methadone. Able to pick up weekend supply on 10/10 am.   Physical exam  Vitals:   04/03/19 0927 04/03/19 1307 04/03/19 2355 04/04/19 0500  BP:  110/75 112/67 106/64  Pulse:  88 84 81  Resp: 20 20 18 17   Temp: 98.4 F (36.9 C) 98.8 F (37.1 C) 98.4 F (36.9 C) 97.9 F (36.6 C)  TempSrc: Oral Oral Oral Oral  SpO2: 98%  98% 99%  Weight:       General: alert and cooperative Lochia: appropriate Uterine Fundus: firm Incision: Dressing is clean, dry, and intact DVT Evaluation: No evidence of DVT seen on physical exam. Labs: Lab Results  Component Value Date   WBC 31.2 (H) 04/03/2019   HGB 9.7 (L) 04/03/2019   HCT 31.3 (L) 04/03/2019   MCV 86.5  04/03/2019   PLT 305 04/03/2019   CMP Latest Ref Rng & Units 04/03/2019  Glucose 70 - 99 mg/dL 06/03/2019)  BUN 6 - 20 mg/dL 9  Creatinine 381(W - 2.99 mg/dL 3.71  Sodium 6.96 - 789 mmol/L 137  Potassium 3.5 - 5.1 mmol/L 3.9  Chloride 98 - 111 mmol/L 103  CO2 22 - 32 mmol/L 25  Calcium 8.9 - 10.3 mg/dL 7.9(L)  Total Protein 6.5 - 8.1 g/dL 4.7(L)  Total Bilirubin 0.3 - 1.2 mg/dL 0.5  Alkaline Phos 38 - 126 U/L 115  AST 15 - 41 U/L 20  ALT 0 - 44 U/L 9    Discharge instruction: per After Visit Summary and "Baby and Me Booklet".  After visit meds:  Allergies as of 04/04/2019   No Known Allergies     Medication List    TAKE these medications   ALPRAZolam 1 MG tablet Commonly known as: XANAX Take 1 mg by mouth at bedtime as needed for anxiety.   ibuprofen 600 MG tablet Commonly known as: ADVIL Take 1 tablet (600 mg  total) by mouth every 6 (six) hours.   senna-docusate 8.6-50 MG tablet Commonly known as: Senokot-S Take 2 tablets by mouth daily. Start taking on: April 05, 2019       Diet: routine diet  Activity: Advance as tolerated. Pelvic rest for 6 weeks.   Outpatient follow up:4 weeks Follow up Appt:No future appointments. Follow up Visit:No follow-ups on file.  Postpartum contraception: Undecided; discussed and patient declined to make a decision prior to d/c   Newborn Data: Live born female  Birth Weight: 6 lb 2.1 oz (2780 g) APGAR: 1, 6  Newborn Delivery   Birth date/time: 04/02/2019 22:26:00 Delivery type: C-Section, Low Transverse Trial of labor: No C-section categorization: Repeat      Baby Feeding: Bottle Disposition:NICU   04/04/2019 Melina Schools, DO

## 2019-04-04 NOTE — Clinical Social Work Maternal (Signed)
CLINICAL SOCIAL WORK MATERNAL/CHILD NOTE  Patient Details  Name: Shiza A Toda MRN: [id number] Date of Birth: 10/27/1990  Date:  04/04/2019  Clinical Social Worker Initiating Note:  Mahad Newstrom, LCSW     Date/Time: Initiated:  04/04/19/1427             Child's Name:  Ryleigh Mcquiston   Biological Parents:  Mother, Father(Father: Brandon Staley)   Need for Interpreter:  None   Reason for Referral:  Late or No Prenatal Care , Current Substance Use/Substance Use During Pregnancy , Other (Comment)(NICU Admission)   Address:  [address] [place] [address]    Phone number:  [phone] (home)     Additional phone number:   Household Members/Support Persons (HM/SP):   Household Member/Support Person 1, Household Member/Support Person 2, Household Member/Support Person 3   HM/SP Name Relationship DOB or Age  HM/SP -1  Kim Rabinovich step mom   HM/SP -2  Wayne Luckman dad   HM/SP -3 Braylen Brooks Robinson son 05/16/2012  HM/SP -4     HM/SP -5     HM/SP -6     HM/SP -7     HM/SP -8       Natural Supports (not living in the home): Other (Comment)(Both FOBs)   Professional Supports:Other (Comment)(Counselor at [facility])   Employment:Unemployed   Type of Work:     Education:  Some College   Homebound arranged:    Financial Resources:Medicaid   Other Resources: Food Stamps (COVID 19 Food Stamps and plans to apply for WIC)   Cultural/Religious Considerations Which May Impact Care:   Strengths: Ability to meet basic needs , Pediatrician chosen, Home prepared for child , Understanding of illness   Psychotropic Medications:         Pediatrician:    [place] area  Pediatrician List:   [place] Other([facility])  [place]   [place]   [place]   [place]   [place]     Pediatrician Fax Number:    Risk Factors/Current Problems: Substance  Use , Mental Health Concerns    Cognitive State: Able to Concentrate , Alert , Goal Oriented , Insightful , Linear Thinking    Mood/Affect: Interested , Calm    CSW Assessment:CSW met with MOB at bedside to discuss consult for no prenatal care, current substance use during pregnancy and infant's NICU admission, MOB's mother present. MOB reported that her mother was about to leave, MOB's mother left. CSW introduced self and explained reason for consult. MOB was welcoming, talkative, pleasant and engaged during assessment. MOB reported that she resides with her stepmother, father and older son. MOB reported that her son spends time with both her and his dad. MOB reported that there was not a custody arrangement in place. MOB reported that she is currently unemployed which is a stressor for her. CSW acknowledged how unemployment can be stressful. MOB reported that she receives COVID food stamps and plans to apply for WIC. CSW provided MOB the contact information to call and apply for WIC. MOB reported that she has items to care for infant including a car seat and basinet. MOB reported that she will be able to get all items needed to care for infant. CSW inquired about MOB's support system, MOB reported that her parents and both FOBs are her supports.   CSW inquired about MOB's mental health history, MOB reported that she has not been diagnosed by a doctor but feels she suffers   from anxiety, stress and depression. MOB reported that she is currently seeing a counselor at [facility] and it has been helpful. MOB reported that she has been going to [facility] for [facility]one management for 6 months and that it has been helpful. MOB reported that she also has been taking benzos for years that are not prescribed to treat her anxiety. MOB reported that she went through a lot with her biological mother and that she has been stressed out due to unemployment and unexpected  pregnancy. MOB shared that she has been with her stepmother since age 3. CSW acknowledged and normalized feelings surrounding MOB's stressors. 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 MOB described her symptoms as letting herself go and not wanting to leave the house. MOB reported that she was never treated for postpartum depression and her symptoms subsided when her son was about 5 months old. MOB reported that now that she has medicaid she plans to get some mental health treatment. CSW positively affirmed MOB's plan and encouraged MOB to follow through. CSW provided MOB with local mental health resources. CSW and MOB discussed MOB's edinburgh score 19. MOB reported that over the last seven days she has been stressed because she didn't have any prenatal care and didn't know what was going to happen when she had the baby. MOB reported that she didn't have prenatal care because she found out about pregnancy at 20 weeks and no OB offices would see her. MOB reported that she also received her medicaid 2 weeks ago. MOB reported that she feels relieved now that she has given birth. CSW informed MOB that she answered hardly ever to question 10 (having thoughts of harming self) and inquired if MOB had SI in the last seven days, MOB reported no that she answered the question wrong. CSW inquired about how MOB is feeling emotionally after giving birth, MOB reported that she feels okay so far. MOB presented calm and had insight about her mental health. MOB did not demonstrate any acute mental health signs/symptoms. CSW assessed for safety, MOB denied SI, HI and domestic violence.   CSW provided education regarding the baby blues period vs. perinatal mood disorders, discussed treatment and gave resources for mental health follow up if concerns arise.  CSW recommends self-evaluation during the postpartum time period using the New Mom Checklist from Postpartum Progress and encouraged MOB to contact a  medical professional if symptoms are noted at any time. CSW encouraged MOB to find an OB provider for her 6 week follow up.    CSW provided review of Sudden Infant Death Syndrome (SIDS) precautions. MOB verbalized understanding and reported that she had a friend lose a baby to SIDS. MOB reported that infant would sleep in her crib.   CSW and MOB discussed infant's NICU admission. MOB reported that it has been pretty well and that she feels well informed. CSW informed MOB about the NICU, what to expect and resources/supports while infant is admitted to the NICU. MOB reported that her son was also in the NICU. MOB denied any current needs/concerns. CSW encouraged MOB to contact CSW if any needs/concerns arise.   CSW informed MOB about the hospital drug screen policy due to no prenatal care and medication assistance treatment program. CSW informed MOB that infant's UDS was positive for benzodiazepines, opiates and cocaine. MOB reported that she did not have a prescription for benzodiazepines but that she bought them to treat her anxiety. MOB reported that she   tried to wean herself off benozs but that she would have seizures if she went longer than 3 days without them. MOB reported that she feels like the pills she bought may have had cocaine in them but she is unsure. MOB reported that she has not taken cocaine purposefully in a little over a year. MOB reported that infant tested positive for opiates because she took half of a Vicodin due to pain that she was prescribed when her son was born in 2013. MOB reported that it was one that she had left. CSW informed MOB that a CPS report would be made due to infant's positive UDS. MOB verbalized understanding and reported that she had an open CPS case in 2013 when her son was born. MOB reported that the close has been closed. MOB denied any questions/concerns about the CPS report.   CSW made a [place] [facility] CPS report. Currently there are barriers to  infant discharging home with MOB.  CSW will continue to offer support/resources while infant is admitted to the NICU.   CSW Plan/Description: Sudden Infant Death Syndrome (SIDS) Education, Perinatal Mood and Anxiety Disorder (PMADs) Education, Other Patient/Family Education, Hospital Drug Screen Policy Information, CSW Awaiting CPS Disposition Plan, Child Protective Service Report , CSW Will Continue to Monitor Umbilical Cord Tissue Drug Screen Results and Make Report if Warranted, Other Information/Referral to Community Resources    Astoria Condon L Kanyon Seibold, LCSW 04/04/2019, 2:32 PM           

## 2019-04-04 NOTE — Discharge Instructions (Signed)
Postpartum Care After Cesarean Delivery °This sheet gives you information about how to care for yourself from the time you deliver your baby to up to 6-12 weeks after delivery (postpartum period). Your health care provider may also give you more specific instructions. If you have problems or questions, contact your health care provider. °Follow these instructions at home: °Medicines °· Take over-the-counter and prescription medicines only as told by your health care provider. °· If you were prescribed an antibiotic medicine, take it as told by your health care provider. Do not stop taking the antibiotic even if you start to feel better. °· Ask your health care provider if the medicine prescribed to you: °? Requires you to avoid driving or using heavy machinery. °? Can cause constipation. You may need to take actions to prevent or treat constipation, such as: °§ Drink enough fluid to keep your urine pale yellow. °§ Take over-the-counter or prescription medicines. °§ Eat foods that are high in fiber, such as beans, whole grains, and fresh fruits and vegetables. °§ Limit foods that are high in fat and processed sugars, such as fried or sweet foods. °Activity °· Gradually return to your normal activities as told by your health care provider. °· Avoid activities that take a lot of effort and energy (are strenuous) until approved by your health care provider. Walking at a slow to moderate pace is usually safe. Ask your health care provider what activities are safe for you. °? Do not lift anything that is heavier than your baby or 10 lb (4.5 kg) as told by your health care provider. °? Do not vacuum, climb stairs, or drive a car for as long as told by your health care provider. °· If possible, have someone help you at home until you are able to do your usual activities yourself. °· Rest as much as possible. Try to rest or take naps while your baby is sleeping. °Vaginal bleeding °· It is normal to have vaginal bleeding  (lochia) after delivery. Wear a sanitary pad to absorb vaginal bleeding and discharge. °? During the first week after delivery, the amount and appearance of lochia is often similar to a menstrual period. °? Over the next few weeks, it will gradually decrease to a dry, yellow-brown discharge. °? For most women, lochia stops completely by 4-6 weeks after delivery. Vaginal bleeding can vary from woman to woman. °· Change your sanitary pads frequently. Watch for any changes in your flow, such as: °? A sudden increase in volume. °? A change in color. °? Large blood clots. °· If you pass a blood clot, save it and call your health care provider to discuss. Do not flush blood clots down the toilet before you get instructions from your health care provider. °· Do not use tampons or douches until your health care provider says this is safe. °· If you are not breastfeeding, your period should return 6-8 weeks after delivery. If you are breastfeeding, your period may return anytime between 8 weeks after delivery and the time that you stop breastfeeding. °Perineal care ° °· If your C-section (Cesarean section) was unplanned, and you were allowed to labor and push before delivery, you may have pain, swelling, and discomfort of the tissue between your vaginal opening and your anus (perineum). You may also have an incision in the tissue (episiotomy) or the tissue may have torn during delivery. Follow these instructions as told by your health care provider: °? Keep your perineum clean and dry as told by   your health care provider. Use medicated pads and pain-relieving sprays and creams as directed. °? If you have an episiotomy or vaginal tear, check the area every day for signs of infection. Check for: °§ Redness, swelling, or pain. °§ Fluid or blood. °§ Warmth. °§ Pus or a bad smell. °? You may be given a squirt bottle to use instead of wiping to clean the perineum area after you go to the bathroom. As you start healing, you may use  the squirt bottle before wiping yourself. Make sure to wipe gently. °? To relieve pain caused by an episiotomy, vaginal tear, or hemorrhoids, try taking a warm sitz bath 2-3 times a day. A sitz bath is a warm water bath that is taken while you are sitting down. The water should only come up to your hips and should cover your buttocks. °Breast care °· Within the first few days after delivery, your breasts may feel heavy, full, and uncomfortable (breast engorgement). You may also have milk leaking from your breasts. Your health care provider can suggest ways to help relieve breast discomfort. Breast engorgement should go away within a few days. °· If you are breastfeeding: °? Wear a bra that supports your breasts and fits you well. °? Keep your nipples clean and dry. Apply creams and ointments as told by your health care provider. °? You may need to use breast pads to absorb milk leakage. °? You may have uterine contractions every time you breastfeed for several weeks after delivery. Uterine contractions help your uterus return to its normal size. °? If you have any problems with breastfeeding, work with your health care provider or a lactation consultant. °· If you are not breastfeeding: °? Avoid touching your breasts as this can make your breasts produce more milk. °? Wear a well-fitting bra and use cold packs to help with swelling. °? Do not squeeze out (express) milk. This causes you to make more milk. °Intimacy and sexuality °· Ask your health care provider when you can engage in sexual activity. This may depend on your: °? Risk of infection. °? Healing rate. °? Comfort and desire to engage in sexual activity. °· You are able to get pregnant after delivery, even if you have not had your period. If desired, talk with your health care provider about methods of family planning or birth control (contraception). °Lifestyle °· Do not use any products that contain nicotine or tobacco, such as cigarettes, e-cigarettes,  and chewing tobacco. If you need help quitting, ask your health care provider. °· Do not drink alcohol, especially if you are breastfeeding. °Eating and drinking ° °· Drink enough fluid to keep your urine pale yellow. °· Eat high-fiber foods every day. These may help prevent or relieve constipation. High-fiber foods include: °? Whole grain cereals and breads. °? Brown rice. °? Beans. °? Fresh fruits and vegetables. °· Take your prenatal vitamins until your postpartum checkup or until your health care provider tells you it is okay to stop. °General instructions °· Keep all follow-up visits for you and your baby as told by your health care provider. Most women visit their health care provider for a postpartum checkup within the first 3-6 weeks after delivery. °Contact a health care provider if you: °· Feel unable to cope with the changes that a new baby brings to your life, and these feelings do not go away. °· Feel unusually sad or worried. °· Have breasts that are painful, hard, or turn red. °· Have a fever. °·   Have trouble holding urine or keeping urine from leaking. °· Have little or no interest in activities you used to enjoy. °· Have not breastfed at all and you have not had a menstrual period for 12 weeks after delivery. °· Have stopped breastfeeding and you have not had a menstrual period for 12 weeks after you stopped breastfeeding. °· Have questions about caring for yourself or your baby. °· Pass a blood clot from your vagina. °Get help right away if you: °· Have chest pain. °· Have difficulty breathing. °· Have sudden, severe leg pain. °· Have severe pain or cramping in your abdomen. °· Bleed from your vagina so much that you fill more than one sanitary pad in one hour. Bleeding should not be heavier than your heaviest period. °· Develop a severe headache. °· Faint. °· Have blurred vision or spots in your vision. °· Have a bad-smelling vaginal discharge. °· Have thoughts about hurting yourself or your  baby. °If you ever feel like you may hurt yourself or others, or have thoughts about taking your own life, get help right away. You can go to your nearest emergency department or call: °· Your local emergency services (911 in the [place] °· A suicide crisis helpline, such as the National Suicide Prevention Lifeline at 1-800-273-8255. This is open 24 hours a day. °Summary °· The period of time from when you deliver your baby to up to 6-12 weeks after delivery is called the postpartum period. °· Gradually return to your normal activities as told by your health care provider. °· Keep all follow-up visits for you and your baby as told by your health care provider. °This information is not intended to replace advice given to you by your health care provider. Make sure you discuss any questions you have with your health care provider. °Document Released: 06/09/2000 Document Revised: 01/30/2018 Document Reviewed: 01/30/2018 °Elsevier Patient Education © 2020 [employer] ° °

## 2019-04-10 LAB — GC/CHLAMYDIA PROBE AMP (~~LOC~~) NOT AT ARMC
Chlamydia: NEGATIVE
Comment: NEGATIVE
Comment: NORMAL
Neisseria Gonorrhea: NEGATIVE

## 2019-04-14 ENCOUNTER — Ambulatory Visit: Payer: Self-pay

## 2019-04-14 NOTE — Lactation Note (Signed)
This note was copied from a baby's chart. Lactation Consultation Note  Patient Name: Sharon [name] [id number] Date: 04/14/2019 Reason for consult: Follow-up assessment;NICU baby;Term  Mom didn't have a feeding choice listed on admission; but baby has been on formula so far due to NICU stay. Consult with RN Otila Kluver and she confirmed that mom won't be pumping and that baby will be on 100% formula. [place] services no longer needed.  Maternal Data    Feeding Feeding Type: Formula Nipple Type: Dr. Roosvelt Harps Preemie(wide base)  LATCH Score                   Interventions    Lactation Tools Discussed/Used     Consult Status Consult Status: Complete    Shaquanna Lycan Francene Boyers 04/14/2019, 4:26 PM

## 2019-05-13 ENCOUNTER — Ambulatory Visit: Payer: Medicaid Other | Admitting: Obstetrics

## 2019-05-28 ENCOUNTER — Encounter: Payer: Self-pay | Admitting: Obstetrics

## 2019-05-28 ENCOUNTER — Other Ambulatory Visit: Payer: Self-pay

## 2019-05-28 ENCOUNTER — Ambulatory Visit (INDEPENDENT_AMBULATORY_CARE_PROVIDER_SITE_OTHER): Payer: Medicaid Other | Admitting: Obstetrics

## 2019-05-28 DIAGNOSIS — F418 Other specified anxiety disorders: Secondary | ICD-10-CM

## 2019-05-28 DIAGNOSIS — Z1389 Encounter for screening for other disorder: Secondary | ICD-10-CM

## 2019-05-28 DIAGNOSIS — F1910 Other psychoactive substance abuse, uncomplicated: Secondary | ICD-10-CM

## 2019-05-28 DIAGNOSIS — F17210 Nicotine dependence, cigarettes, uncomplicated: Secondary | ICD-10-CM

## 2019-05-28 DIAGNOSIS — Z0000 Encounter for general adult medical examination without abnormal findings: Secondary | ICD-10-CM

## 2019-05-28 NOTE — Progress Notes (Signed)
Post Partum Exam  Sharon [name] is a 28 y.o. G60P2002 female who presents for a postpartum visit. She is 7 weeks postpartum following a low cervical transverse Cesarean section. I have fully reviewed the prenatal and intrapartum course. The delivery was at 34 gestational weeks.  Anesthesia: general. Postpartum course has been Unremarkable. Baby's course has been unremarkable. Baby is feeding by Bottle Good Start . Bleeding red. Bowel function is normal. Bladder function is normal. Patient is sexually active. Contraception method is condoms. Postpartum depression screening:Positive EPDS - 21   The following portions of the patient's history were reviewed and updated as appropriate: allergies, current medications, past family history, past medical history, past social history, past surgical history and problem list. Last pap smear done 01/23/2017 and was Normal  Review of Systems A comprehensive review of systems was negative except for: Behavioral/Psych: positive for anxiety and depression    Objective:  unknown if currently breastfeeding.  General:  alert   Breasts:  inspection negative, no nipple discharge or bleeding, no masses or nodularity palpable  Lungs: clear to auscultation bilaterally  Heart:  regular rate and rhythm, S1, S2 normal, no murmur, click, rub or gallop  Abdomen: soft, non-tender; bowel sounds normal; no masses,  no organomegaly and incision is clean, dry and intact   Assessment:   1. Postpartum care following cesarean delivery - doing well post op  2. 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 Depression with anxiety Rx: - Ambulatory referral to Internal Medicine  4. Routine adult health maintenance Rx: - Ambulatory referral to Internal Medicine  5. Tobacco dependence due to cigarettes - tobacco cessation recommended with medication and behavioral modification   Plan:   1. Contraception: condoms 2. Continue  PNV's 3. Follow up in: 4 weeks for pap smear  Shelly Bombard, MD 05/28/2019 12:45 PM

## 2019-06-25 ENCOUNTER — Ambulatory Visit: Payer: Medicaid Other | Admitting: Obstetrics & Gynecology

## 2019-07-17 ENCOUNTER — Telehealth: Payer: Self-pay | Admitting: Obstetrics & Gynecology

## 2019-10-11 ENCOUNTER — Ambulatory Visit (HOSPITAL_COMMUNITY)
Admission: AD | Admit: 2019-10-11 | Discharge: 2019-10-11 | Disposition: A | Discharge status: Home / Self Care | Payer: Medicaid Other | Attending: Psychiatry | Admitting: Psychiatry

## 2019-10-11 DIAGNOSIS — F1110 Opioid abuse, uncomplicated: Secondary | ICD-10-CM | POA: Diagnosis not present

## 2019-10-11 DIAGNOSIS — F419 Anxiety disorder, unspecified: Secondary | ICD-10-CM | POA: Diagnosis not present

## 2019-10-11 NOTE — H&P (Signed)
Behavioral Health Medical Screening Exam  Sharon [name] is an 29 y.o. female. Presented to [facility] under involuntary committment for substances abuse. Was reported that patient has discharged herself from a  Residential treatment  facility and is using heroin again. Patient is awake, alert and orentient * 3. reported she is residing at a local motel with her boyfriend. Stated " I treatment out, I may go back when I am ready." she denied suicidal or homicidal ideations. Patient doesn't  appear to be experiencing any withdrawal symptoms. Ie.. headaches, N/V or tremors noted. Chart reviewed, vitals stable. Patient denied chest pain or shortness of breath.  NP spoke to Casandra Dallaire ar [phone]. Who reported patient's chaldern are with their older brother Halei Hanover no other safety concerns noted. IVC to be resided. Support, encouragement and reassurance was provided.    Total Time spent with patient: 15 minutes  Psychiatric Specialty Exam: Physical Exam  Vitals reviewed. Psychiatric: She has a normal mood and affect. Her behavior is normal.    Review of Systems  Blood pressure 128/82, pulse (!) 109, temperature 97.7 F (36.5 C), temperature source Tympanic, resp. rate 18, SpO2 100 %, not currently breastfeeding.There is no height or weight on file to calculate BMI.  General Appearance: Casual  Eye Contact:  Fair  Speech:  Clear and Coherent  Volume:  Normal  Mood:  Anxious  Affect:  Congruent  Thought Process:  Coherent  Orientation:  Full (Time, Place, and Person)  Thought Content:  Logical  Suicidal Thoughts:  No  Homicidal Thoughts:  No  Memory:  Immediate;   Fair Recent;   Fair  Judgement:  Fair  Insight:  Good  Psychomotor Activity:  Normal  Concentration: Concentration: Fair  Recall:  [employer] of Knowledge:Fair  Language: Fair  Akathisia:  No  Handed:  Right  AIMS (if indicated):     Assets:  Communication Skills Desire for Improvement Resilience Social Support   Sleep:       Musculoskeletal: Strength & Muscle Tone: within normal limits Gait & Station: normal Patient leans: N/A  Blood pressure 128/82, pulse (!) 109, temperature 97.7 F (36.5 C), temperature source Tympanic, resp. rate 18, SpO2 100 %, not currently breastfeeding.  Recommendations: Patient was offered additional outpatient resources for substance abuse  IVC resided by NP T.Raine Blodgett   Based on my evaluation the patient does not appear to have an emergency medical condition.  Oneta Rack, NP 10/11/2019, 9:25 AM

## 2019-10-11 NOTE — BH Assessment (Addendum)
Assessment Note  Sharon [name] is an 29 y.o. female that presents this date with IVC. 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 Respondent has been using heroin every day for the past two weeks. The respondent has not been attending her classes or going to the methadone clinic. The respondent has not been attending to personal hygiene or eating. The respondent has not been home to check on her children in the past two weeks. Patient on arrival denies content of IVC. Patient denies any S/I, H/I or AVH. Patient states she currently resides with her mother and step father and her 60 old child. Patient states she also has a 74 year old that is also staying at that residence and spends time with her sister. Patient states both of those children are in the care of above relatives and not endangered. Patient does admit to past CPS involvement with her 52 month old child due to "the baby's daddy" trying to "get in her business." Patient states those accusations were baseless and she has been cleared by CPS. Patient reports she has been involved in a methadone maintenance program at [facility] since April of 2020. Patient states her initial methadone dose was 80 mg daily and gradually reduced down to 20 mg and has been progressing enough to get take homes. Patient states she decided to "self taper" herself with the last few dosages she received two weeks ago and has seen stopped using that medication successfully one week ago without incident. Patient reports no side effects or withdrawals at this time. Patient states she feels she does not need to follow up with that provider for supplemental services at this time. Patient denies any current opiate use although reports daily cannabis use (1 gram or more) for the last week and sporadic alcohol use (two to three times a week reporting various amounts) with last use yesterday when she reported she had "a couple  beers." Patient states she was diagnosed with some "post partum issues" two months ago at [facility] who prescribed her 0.5 mg of Xanax BID for anxiety. Patient reports she takes that medication as indicated and is currently compliant. Patient denies any current mental health symptoms. Patient denies any previous attempts or gestures at self harm although dose report a drug overdose that occurred in 2018. Patient denies that was a suicide attempt. Per record review patient was admitted to [facility] in 2018 after she was found by police in her car unresponsive with pills found in her possession at that time. Patient was hospitalized for that incident and was encouraged to follow up with [facility] [place] which patient stated she did but "only went to a couple sessions." Patient states she feels this incident is associated with her leaving her current resident for the last two days without informing her relatives of her whereabouts and has been staying in a local [employer] where law enforcement found her this morning. Patient denies any previous attempts or gestures at self harm and contracts for safety. Patient's mental health history is limited per chart review.   Patient is currently contracting for safety. Patient presents with a pleasant affect and is oriented x4. Patient is alert and speech normal in rate and volume. Patient's memory is intact and thoughts organized. Patient does not appear to be responding to internal stimuli. Patient is requesting to be discharged. Collateral was obtained from petitioner who did report that the IVC was initiated in  an attempt to "get the girl help" for ongoing addiction issues. Petitioner did not voice any immediate concerns in reference to patient directly harming herself or others. NP explained IVC criteria and informed petitioner that based on affidavit that patient did not meet criteria to uphold IVC. Lewis NP also evaluated patient and determined patient did not  meet IVC criteria or warrant a inpatient admission. Patient denies any S/I, H/I or AVH. Patient is declining resources offered and IVC will be rescinded.       Diagnosis: GAD  Past Medical History:  Past Medical History:  Diagnosis Date  . Anxiety    no meds  . Arthritis    right lower arm tendonitis  . Carpal tunnel syndrome   . Headache(784.0)    otc med prn    Past Surgical History:  Procedure Laterality Date  . CESAREAN SECTION  05/16/2012   Procedure: CESAREAN SECTION;  Surgeon: Brock Bad, MD;  Location: [facility] ORS;  Service: Obstetrics;  Laterality: N/A;  Primary  . CESAREAN SECTION N/A 04/02/2019   Procedure: CESAREAN SECTION;  Surgeon: Lazaro Arms, MD;  Location: [facility] LD ORS;  Service: Obstetrics;  Laterality: N/A;  . FOOT SURGERY  1997   removal foreign body (sewing needle)  . TOOTH EXTRACTION  2008   upper incisor x2    Family History:  Family History  Problem Relation Age of Onset  . Seizures Father   . Diabetes Other   . CAD Other   . Hypertension Other   . Anesthesia problems Neg Hx   . Hypotension Neg Hx   . Malignant hyperthermia Neg Hx   . Pseudochol deficiency Neg Hx     Social History:  reports that she has been smoking cigarettes. She has a 4.00 pack-year smoking history. She has quit using smokeless tobacco. She reports current drug use. Drugs: Marijuana, Other-see comments, and Oxycodone. She reports that she does not drink alcohol.  Additional Social History:  Alcohol / Drug Use Pain Medications: See MAR Prescriptions: See MAR Over the Counter: See MAR History of alcohol / drug use?: Yes Longest period of sobriety (when/how long): Unknown Negative Consequences of Use: Personal relationships Withdrawal Symptoms: (Denies) Substance #1 Name of Substance 1: Opiates per hx 1 - Age of First Use: 20 1 - Amount (size/oz): Varies 1 - Frequency: Varies 1 - Duration: Ongoing 1 - Last Use / Amount: Pt cannot recall UDS pending Substance #2 Name  of Substance 2: Alcohol 2 - Age of First Use: 17 2 - Amount (size/oz): Amounts vary 2 - Frequency: Varies 2 - Duration: Ongoing 2 - Last Use / Amount: 10/10/19 Pt states "a few beers" Substance #3 Name of Substance 3: Cannabis 3 - Age of First Use: 17 3 - Amount (size/oz): Varies 3 - Frequency: Varies 3 - Duration: Ongoing 3 - Last Use / Amount: 09/09/19 1 gram  CIWA: CIWA-Ar BP: 128/82 Pulse Rate: (!) 109 COWS:    Allergies: No Known Allergies  Home Medications: (Not in a hospital admission)   OB/GYN Status:  No LMP recorded.  General Assessment Data Location of Assessment: [facility] Assessment Services TTS Assessment: In system Is this a Tele or Face-to-Face Assessment?: Face-to-Face Is this an Initial Assessment or a Re-assessment for this encounter?: Initial Assessment Patient Accompanied by:: N/A Language Other than English: No Living Arrangements: Other (Comment)(With relatives) What gender do you identify as?: Female Marital status: Single Maiden name: Ossa Pregnancy Status: No Living Arrangements: Parent Can pt return to current living  arrangement?: Yes Admission Status: Involuntary Petitioner: Family member Is patient capable of signing voluntary admission?: Yes Referral Source: Self/Family/Friend Insurance type: Medicaid     Crisis Care Plan Living Arrangements: Parent Legal Guardian: (NA) Name of Psychiatrist: [facility] Name of Therapist: None  Education Status Is patient currently in school?: No Is the patient employed, unemployed or receiving disability?: Unemployed  Risk to self with the past 6 months Suicidal Ideation: No Has patient been a risk to self within the past 6 months prior to admission? : No Suicidal Intent: No Has patient had any suicidal intent within the past 6 months prior to admission? : No Is patient at risk for suicide?: No Suicidal Plan?: No Has patient had any suicidal plan within the past 6 months prior to admission?  : No Access to Means: No What has been your use of drugs/alcohol within the last 12 months?: Current use Previous Attempts/Gestures: No How many times?: 0 Other Self Harm Risks: (Excessive SA use) Triggers for Past Attempts: (NA) Intentional Self Injurious Behavior: None Family Suicide History: No Recent stressful life event(s): Other (Comment)(Unemployed, family issues) Persecutory voices/beliefs?: No Depression: No Depression Symptoms: (Denies) Substance abuse history and/or treatment for substance abuse?: Yes Suicide prevention information given to non-admitted patients: Not applicable  Risk to Others within the past 6 months Homicidal Ideation: No Does patient have any lifetime risk of violence toward others beyond the six months prior to admission? : No Thoughts of Harm to Others: No Current Homicidal Intent: No Current Homicidal Plan: No Access to Homicidal Means: No Identified Victim: NA History of harm to others?: No Assessment of Violence: None Noted Violent Behavior Description: NA Does patient have access to weapons?: No Criminal Charges Pending?: No Does patient have a court date: No Is patient on probation?: No  Psychosis Hallucinations: None noted Delusions: None noted  Mental Status Report Appearance/Hygiene: Unremarkable Eye Contact: Good Motor Activity: Freedom of movement Speech: Logical/coherent Level of Consciousness: Alert Mood: Pleasant Affect: Appropriate to circumstance Anxiety Level: None Thought Processes: Coherent, Relevant Judgement: Partial Orientation: Person, Place, Time Obsessive Compulsive Thoughts/Behaviors: None  Cognitive Functioning Concentration: Normal Memory: Recent Intact, Remote Intact Is patient IDD: No Insight: Fair Impulse Control: Fair Appetite: Good Have you had any weight changes? : No Change Sleep: No Change Total Hours of Sleep: 7 Vegetative Symptoms: None  ADLScreening [facility] Assessment Services) Patient's  cognitive ability adequate to safely complete daily activities?: Yes Patient able to express need for assistance with ADLs?: Yes Independently performs ADLs?: Yes (appropriate for developmental age)  Prior Inpatient Therapy Prior Inpatient Therapy: Yes Prior Therapy Dates: 2020 Prior Therapy Facilty/Provider(s): [facility] Reason for Treatment: SA issues  Prior Outpatient Therapy Prior Outpatient Therapy: Yes Prior Therapy Dates: Ongoing Prior Therapy Facilty/Provider(s): [facility] Reason for Treatment: Med mang Does patient have an ACCT team?: No Does patient have Intensive In-House Services?  : No Does patient have [facility] services? : No Does patient have P4CC services?: No  ADL Screening (condition at time of admission) Patient's cognitive ability adequate to safely complete daily activities?: Yes Is the patient deaf or have difficulty hearing?: No Does the patient have difficulty seeing, even when wearing glasses/contacts?: No Does the patient have difficulty concentrating, remembering, or making decisions?: No Patient able to express need for assistance with ADLs?: Yes Does the patient have difficulty dressing or bathing?: No Independently performs ADLs?: Yes (appropriate for developmental age) Does the patient have difficulty walking or climbing stairs?: No Weakness of Legs: None Weakness of Arms/Hands:  None  Home Assistive Devices/Equipment Home Assistive Devices/Equipment: None  Therapy Consults (therapy consults require a physician order) PT Evaluation Needed: No OT Evalulation Needed: No SLP Evaluation Needed: No Abuse/Neglect Assessment (Assessment to be complete while patient is alone) Abuse/Neglect Assessment Can Be Completed: Yes Physical Abuse: Denies Verbal Abuse: Denies Sexual Abuse: Denies Exploitation of patient/patient's resources: Denies Self-Neglect: Denies Values / Beliefs Cultural Requests During Hospitalization: None Spiritual Requests  During Hospitalization: None Consults Spiritual Care Consult Needed: No Transition of Care Team Consult Needed: No Advance Directives (For Healthcare) Does Patient Have a Medical Advance Directive?: No Would patient like information on creating a medical advance directive?: No - Patient declined          Disposition: Lewis NP also evaluated patient and determined patient did not meet IVC criteria or warrant a inpatient admission.. Patient denies any S/I, H/I or AVH. Patient is declining resources offered and IVC will be rescinded.      Disposition Initial Assessment Completed for this Encounter: Yes  On Site Evaluation by:   Reviewed with Physician:    Mamie Nick 10/11/2019 8:42 AM

## 2021-06-02 ENCOUNTER — Encounter (HOSPITAL_COMMUNITY)
Admission: EM | Disposition: A | Discharge status: Home / Self Care | Payer: Self-pay | Source: Home / Self Care | Attending: Family Medicine

## 2021-06-02 ENCOUNTER — Emergency Department (HOSPITAL_BASED_OUTPATIENT_CLINIC_OR_DEPARTMENT_OTHER): Payer: Medicaid Other

## 2021-06-02 ENCOUNTER — Other Ambulatory Visit (HOSPITAL_COMMUNITY): Payer: Medicaid Other

## 2021-06-02 ENCOUNTER — Other Ambulatory Visit: Payer: Self-pay

## 2021-06-02 ENCOUNTER — Encounter (HOSPITAL_COMMUNITY): Payer: Medicaid Other

## 2021-06-02 ENCOUNTER — Inpatient Hospital Stay (HOSPITAL_COMMUNITY): Payer: Medicaid Other

## 2021-06-02 ENCOUNTER — Inpatient Hospital Stay (HOSPITAL_BASED_OUTPATIENT_CLINIC_OR_DEPARTMENT_OTHER)
Admission: EM | Admit: 2021-06-02 | Discharge: 2021-06-10 | DRG: 871 | Disposition: A | Discharge status: Home / Self Care | Payer: Medicaid Other | Attending: Internal Medicine | Admitting: Internal Medicine

## 2021-06-02 ENCOUNTER — Encounter (HOSPITAL_BASED_OUTPATIENT_CLINIC_OR_DEPARTMENT_OTHER): Payer: Self-pay | Admitting: Emergency Medicine

## 2021-06-02 DIAGNOSIS — F1110 Opioid abuse, uncomplicated: Secondary | ICD-10-CM | POA: Diagnosis present

## 2021-06-02 DIAGNOSIS — E669 Obesity, unspecified: Secondary | ICD-10-CM | POA: Diagnosis present

## 2021-06-02 DIAGNOSIS — Z6833 Body mass index (BMI) 33.0-33.9, adult: Secondary | ICD-10-CM

## 2021-06-02 DIAGNOSIS — M609 Myositis, unspecified: Secondary | ICD-10-CM | POA: Diagnosis present

## 2021-06-02 DIAGNOSIS — F17210 Nicotine dependence, cigarettes, uncomplicated: Secondary | ICD-10-CM | POA: Diagnosis present

## 2021-06-02 DIAGNOSIS — J188 Other pneumonia, unspecified organism: Secondary | ICD-10-CM | POA: Diagnosis present

## 2021-06-02 DIAGNOSIS — Z9689 Presence of other specified functional implants: Secondary | ICD-10-CM | POA: Diagnosis not present

## 2021-06-02 DIAGNOSIS — J9811 Atelectasis: Secondary | ICD-10-CM | POA: Diagnosis present

## 2021-06-02 DIAGNOSIS — R6520 Severe sepsis without septic shock: Secondary | ICD-10-CM | POA: Diagnosis present

## 2021-06-02 DIAGNOSIS — L02415 Cutaneous abscess of right lower limb: Secondary | ICD-10-CM | POA: Diagnosis present

## 2021-06-02 DIAGNOSIS — L03116 Cellulitis of left lower limb: Secondary | ICD-10-CM | POA: Diagnosis present

## 2021-06-02 DIAGNOSIS — R7881 Bacteremia: Secondary | ICD-10-CM | POA: Diagnosis not present

## 2021-06-02 DIAGNOSIS — Z23 Encounter for immunization: Secondary | ICD-10-CM

## 2021-06-02 DIAGNOSIS — Z20822 Contact with and (suspected) exposure to covid-19: Secondary | ICD-10-CM | POA: Diagnosis present

## 2021-06-02 DIAGNOSIS — L03115 Cellulitis of right lower limb: Secondary | ICD-10-CM | POA: Diagnosis present

## 2021-06-02 DIAGNOSIS — R519 Headache, unspecified: Secondary | ICD-10-CM | POA: Diagnosis present

## 2021-06-02 DIAGNOSIS — M65871 Other synovitis and tenosynovitis, right ankle and foot: Secondary | ICD-10-CM | POA: Diagnosis present

## 2021-06-02 DIAGNOSIS — D509 Iron deficiency anemia, unspecified: Secondary | ICD-10-CM | POA: Diagnosis present

## 2021-06-02 DIAGNOSIS — J984 Other disorders of lung: Secondary | ICD-10-CM

## 2021-06-02 DIAGNOSIS — Z8249 Family history of ischemic heart disease and other diseases of the circulatory system: Secondary | ICD-10-CM

## 2021-06-02 DIAGNOSIS — J869 Pyothorax without fistula: Secondary | ICD-10-CM | POA: Diagnosis present

## 2021-06-02 DIAGNOSIS — L0291 Cutaneous abscess, unspecified: Secondary | ICD-10-CM | POA: Diagnosis not present

## 2021-06-02 DIAGNOSIS — I959 Hypotension, unspecified: Secondary | ICD-10-CM | POA: Diagnosis present

## 2021-06-02 DIAGNOSIS — J850 Gangrene and necrosis of lung: Secondary | ICD-10-CM

## 2021-06-02 DIAGNOSIS — J189 Pneumonia, unspecified organism: Secondary | ICD-10-CM | POA: Diagnosis not present

## 2021-06-02 DIAGNOSIS — L02416 Cutaneous abscess of left lower limb: Secondary | ICD-10-CM | POA: Diagnosis present

## 2021-06-02 DIAGNOSIS — R000 Tachycardia, unspecified: Secondary | ICD-10-CM | POA: Diagnosis present

## 2021-06-02 DIAGNOSIS — F419 Anxiety disorder, unspecified: Secondary | ICD-10-CM | POA: Diagnosis present

## 2021-06-02 DIAGNOSIS — J90 Pleural effusion, not elsewhere classified: Secondary | ICD-10-CM | POA: Diagnosis present

## 2021-06-02 DIAGNOSIS — I38 Endocarditis, valve unspecified: Secondary | ICD-10-CM | POA: Diagnosis not present

## 2021-06-02 DIAGNOSIS — E871 Hypo-osmolality and hyponatremia: Secondary | ICD-10-CM | POA: Diagnosis present

## 2021-06-02 DIAGNOSIS — R069 Unspecified abnormalities of breathing: Secondary | ICD-10-CM

## 2021-06-02 DIAGNOSIS — S81801A Unspecified open wound, right lower leg, initial encounter: Secondary | ICD-10-CM | POA: Diagnosis not present

## 2021-06-02 DIAGNOSIS — I890 Lymphedema, not elsewhere classified: Secondary | ICD-10-CM | POA: Diagnosis present

## 2021-06-02 DIAGNOSIS — Z833 Family history of diabetes mellitus: Secondary | ICD-10-CM

## 2021-06-02 DIAGNOSIS — A419 Sepsis, unspecified organism: Principal | ICD-10-CM | POA: Diagnosis present

## 2021-06-02 DIAGNOSIS — E876 Hypokalemia: Secondary | ICD-10-CM | POA: Diagnosis present

## 2021-06-02 DIAGNOSIS — L02411 Cutaneous abscess of right axilla: Secondary | ICD-10-CM | POA: Diagnosis present

## 2021-06-02 DIAGNOSIS — M7989 Other specified soft tissue disorders: Secondary | ICD-10-CM | POA: Diagnosis not present

## 2021-06-02 DIAGNOSIS — J1289 Other viral pneumonia: Secondary | ICD-10-CM | POA: Diagnosis not present

## 2021-06-02 DIAGNOSIS — F1910 Other psychoactive substance abuse, uncomplicated: Secondary | ICD-10-CM

## 2021-06-02 DIAGNOSIS — Z79899 Other long term (current) drug therapy: Secondary | ICD-10-CM

## 2021-06-02 DIAGNOSIS — E861 Hypovolemia: Secondary | ICD-10-CM | POA: Diagnosis present

## 2021-06-02 DIAGNOSIS — E8809 Other disorders of plasma-protein metabolism, not elsewhere classified: Secondary | ICD-10-CM | POA: Diagnosis present

## 2021-06-02 HISTORY — PX: CHEST TUBE INSERTION: SHX231

## 2021-06-02 HISTORY — DX: Other psychoactive substance abuse, uncomplicated: F19.10

## 2021-06-02 LAB — CBC WITH DIFFERENTIAL/PLATELET
Abs Immature Granulocytes: 0.25 10*3/uL — ABNORMAL HIGH (ref 0.00–0.07)
Basophils Absolute: 0.1 10*3/uL (ref 0.0–0.1)
Basophils Relative: 0 %
Eosinophils Absolute: 0.1 10*3/uL (ref 0.0–0.5)
Eosinophils Relative: 0 %
HCT: 28.8 % — ABNORMAL LOW (ref 36.0–46.0)
Hemoglobin: 8.7 g/dL — ABNORMAL LOW (ref 12.0–15.0)
Immature Granulocytes: 1 %
Lymphocytes Relative: 19 %
Lymphs Abs: 3.9 10*3/uL (ref 0.7–4.0)
MCH: 22.4 pg — ABNORMAL LOW (ref 26.0–34.0)
MCHC: 30.2 g/dL (ref 30.0–36.0)
MCV: 74.2 fL — ABNORMAL LOW (ref 80.0–100.0)
Monocytes Absolute: 0.7 10*3/uL (ref 0.1–1.0)
Monocytes Relative: 4 %
Neutro Abs: 15.4 10*3/uL — ABNORMAL HIGH (ref 1.7–7.7)
Neutrophils Relative %: 76 %
Platelets: 489 10*3/uL — ABNORMAL HIGH (ref 150–400)
RBC: 3.88 MIL/uL (ref 3.87–5.11)
RDW: 19.7 % — ABNORMAL HIGH (ref 11.5–15.5)
WBC: 20.5 10*3/uL — ABNORMAL HIGH (ref 4.0–10.5)
nRBC: 0 % (ref 0.0–0.2)

## 2021-06-02 LAB — COMPREHENSIVE METABOLIC PANEL
ALT: 21 U/L (ref 0–44)
AST: 29 U/L (ref 15–41)
Albumin: 2 g/dL — ABNORMAL LOW (ref 3.5–5.0)
Alkaline Phosphatase: 106 U/L (ref 38–126)
Anion gap: 10 (ref 5–15)
BUN: 10 mg/dL (ref 6–20)
CO2: 26 mmol/L (ref 22–32)
Calcium: 7.8 mg/dL — ABNORMAL LOW (ref 8.9–10.3)
Chloride: 95 mmol/L — ABNORMAL LOW (ref 98–111)
Creatinine, Ser: 0.69 mg/dL (ref 0.44–1.00)
GFR, Estimated: >60 mL/min (ref >60)
Glucose, Bld: 108 mg/dL — ABNORMAL HIGH (ref 70–99)
Potassium: 3.2 mmol/L — ABNORMAL LOW (ref 3.5–5.1)
Sodium: 131 mmol/L — ABNORMAL LOW (ref 135–145)
Total Bilirubin: 1.8 mg/dL — ABNORMAL HIGH (ref 0.3–1.2)
Total Protein: 8.4 g/dL — ABNORMAL HIGH (ref 6.5–8.1)

## 2021-06-02 LAB — BODY FLUID CELL COUNT WITH DIFFERENTIAL
Eos, Fluid: 0 %
Lymphs, Fluid: 4 %
Monocyte-Macrophage-Serous Fluid: 4 % — ABNORMAL LOW (ref 50–90)
Neutrophil Count, Fluid: 92 % — ABNORMAL HIGH (ref 0–25)
Total Nucleated Cell Count, Fluid: 10625 cu mm — ABNORMAL HIGH (ref 0–1000)

## 2021-06-02 LAB — URINALYSIS, ROUTINE W REFLEX MICROSCOPIC
Glucose, UA: NEGATIVE mg/dL
Ketones, ur: NEGATIVE mg/dL
Leukocytes,Ua: NEGATIVE
Nitrite: POSITIVE — AB
Protein, ur: 30 mg/dL — AB
Specific Gravity, Urine: 1.02 (ref 1.005–1.030)
pH: 5.5 (ref 5.0–8.0)

## 2021-06-02 LAB — URINALYSIS, MICROSCOPIC (REFLEX)

## 2021-06-02 LAB — PREGNANCY, URINE: Preg Test, Ur: NEGATIVE

## 2021-06-02 LAB — PROTIME-INR
INR: 1.2 (ref 0.8–1.2)
Prothrombin Time: 15.5 seconds — ABNORMAL HIGH (ref 11.4–15.2)

## 2021-06-02 LAB — RESP PANEL BY RT-PCR (FLU A&B, COVID) ARPGX2
Influenza A by PCR: NEGATIVE
Influenza B by PCR: NEGATIVE
SARS Coronavirus 2 by RT PCR: NEGATIVE

## 2021-06-02 LAB — LACTATE DEHYDROGENASE, PLEURAL OR PERITONEAL FLUID: LD, Fluid: 4696 U/L — ABNORMAL HIGH (ref 3–23)

## 2021-06-02 LAB — PROTEIN, PLEURAL OR PERITONEAL FLUID: Total protein, fluid: 5.4 g/dL

## 2021-06-02 LAB — GLUCOSE, PLEURAL OR PERITONEAL FLUID: Glucose, Fluid: <20 mg/dL

## 2021-06-02 LAB — LACTIC ACID, PLASMA: Lactic Acid, Venous: 1.7 mmol/L (ref 0.5–1.9)

## 2021-06-02 LAB — APTT: aPTT: 29 seconds (ref 24–36)

## 2021-06-02 IMAGING — DX DG ANKLE COMPLETE 3+V*R*
3 series · 3 of 3 positions shown · non-contrast
Comparison: No priors.

CLINICAL DATA: 30-year-old female with sepsis. Bilateral leg
swelling.

EXAM:
RIGHT ANKLE - COMPLETE 3+ VIEW

[ankle ap]
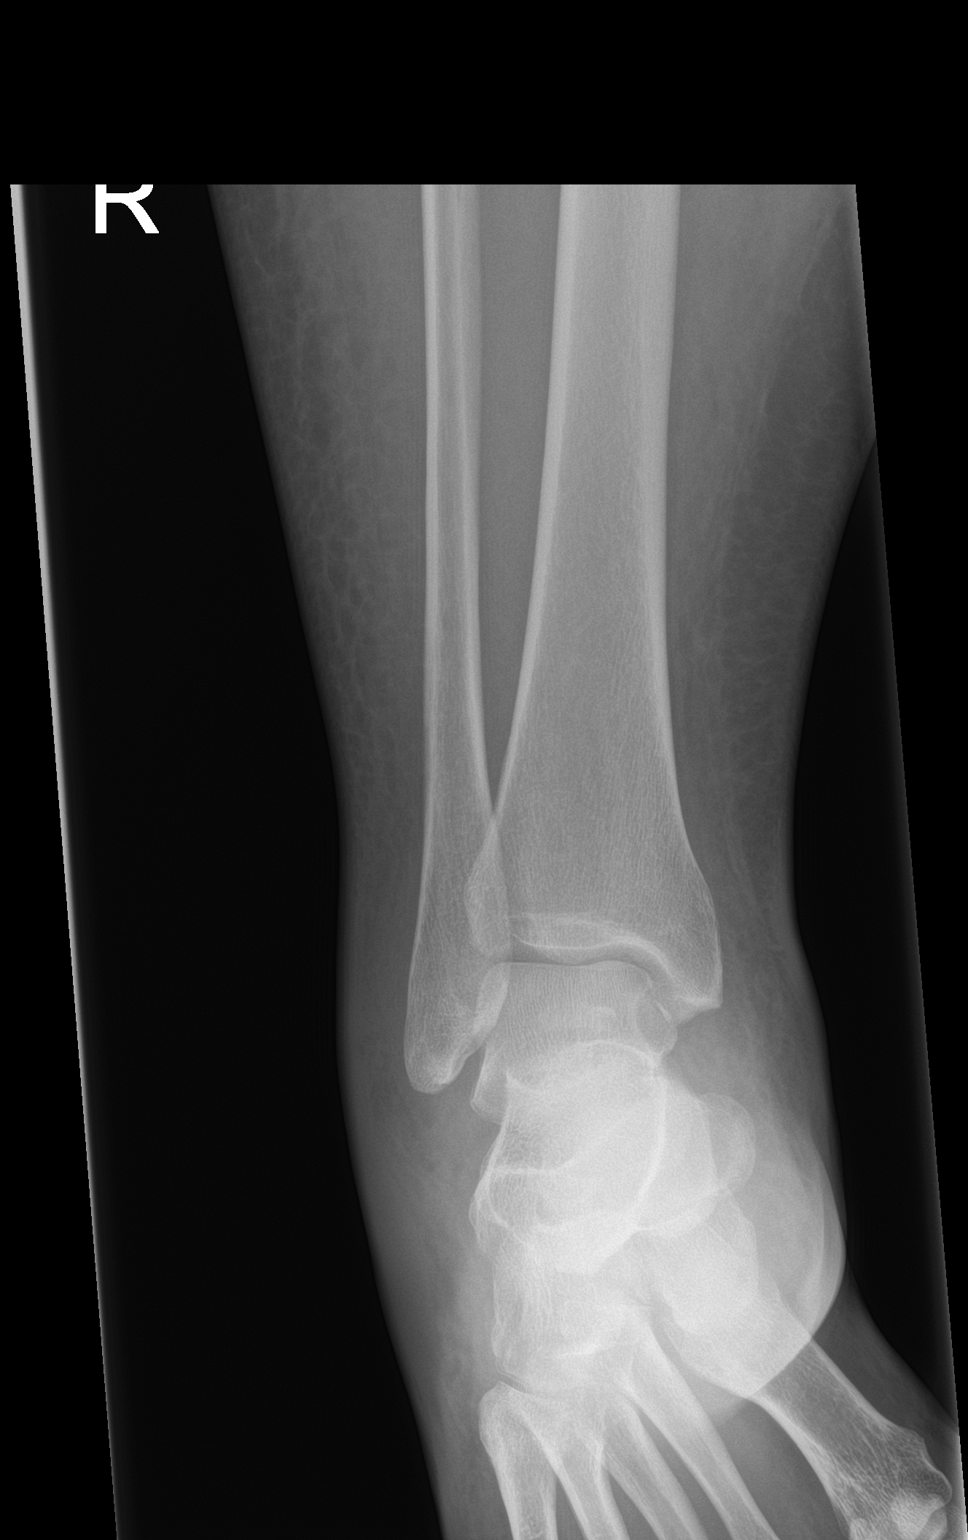

[ankle obl]
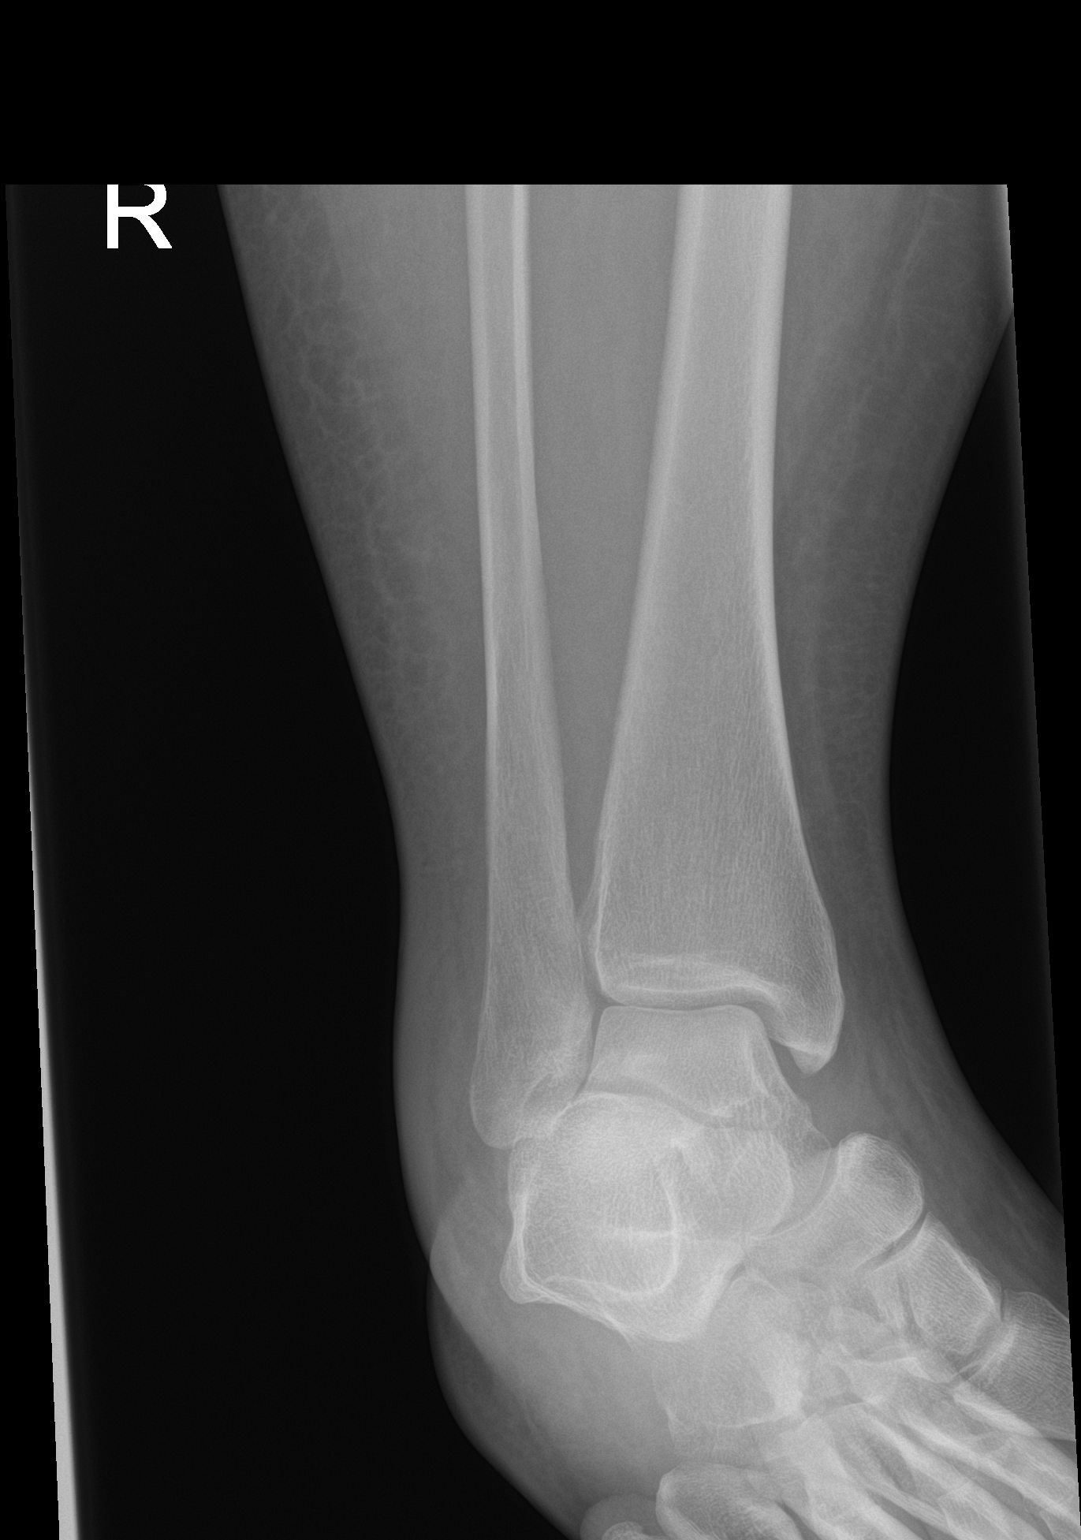

[ankle lat]
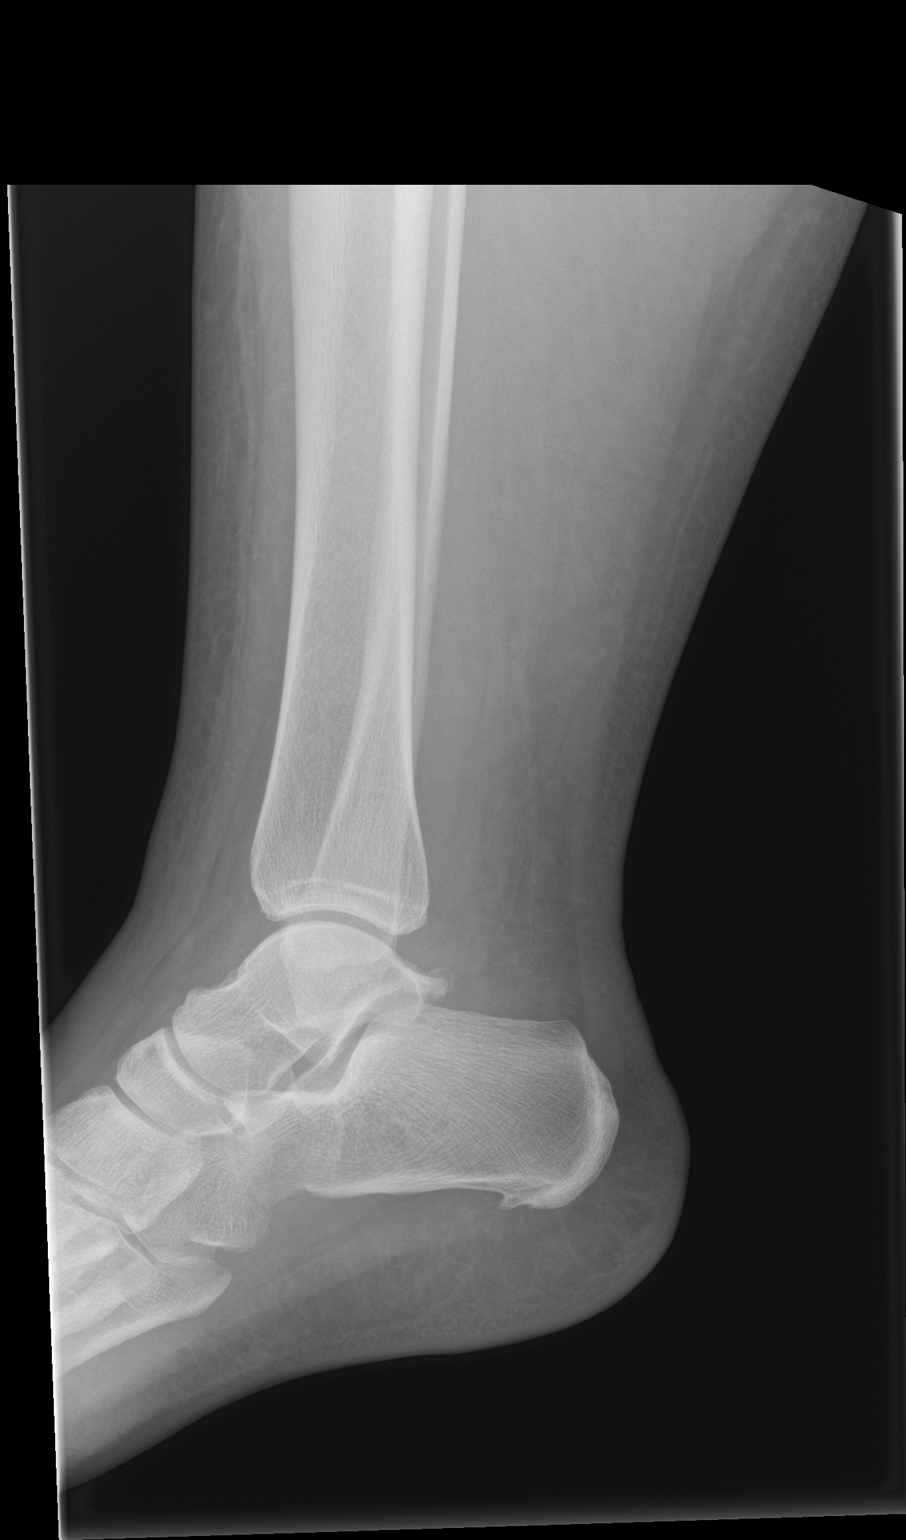

[3 of 3 positions shown; findings below may reference images not displayed]

FINDINGS: There is no evidence of fracture, dislocation, or joint effusion.
There is no evidence of arthropathy or other focal bone abnormality.
Soft tissues are diffusely swollen.
IMPRESSION: 1. Diffuse swelling of the soft tissues around the right ankle
joint. No acute osseous abnormality.

## 2021-06-02 IMAGING — DX DG CHEST 1V PORT
1 series · 1 of 1 positions shown · non-contrast
Comparison: Same day chest radiograph at [N4] hours and same day
chest CT.

CLINICAL DATA: Status post chest tube placement.

EXAM:
PORTABLE CHEST 1 VIEW

[chest]
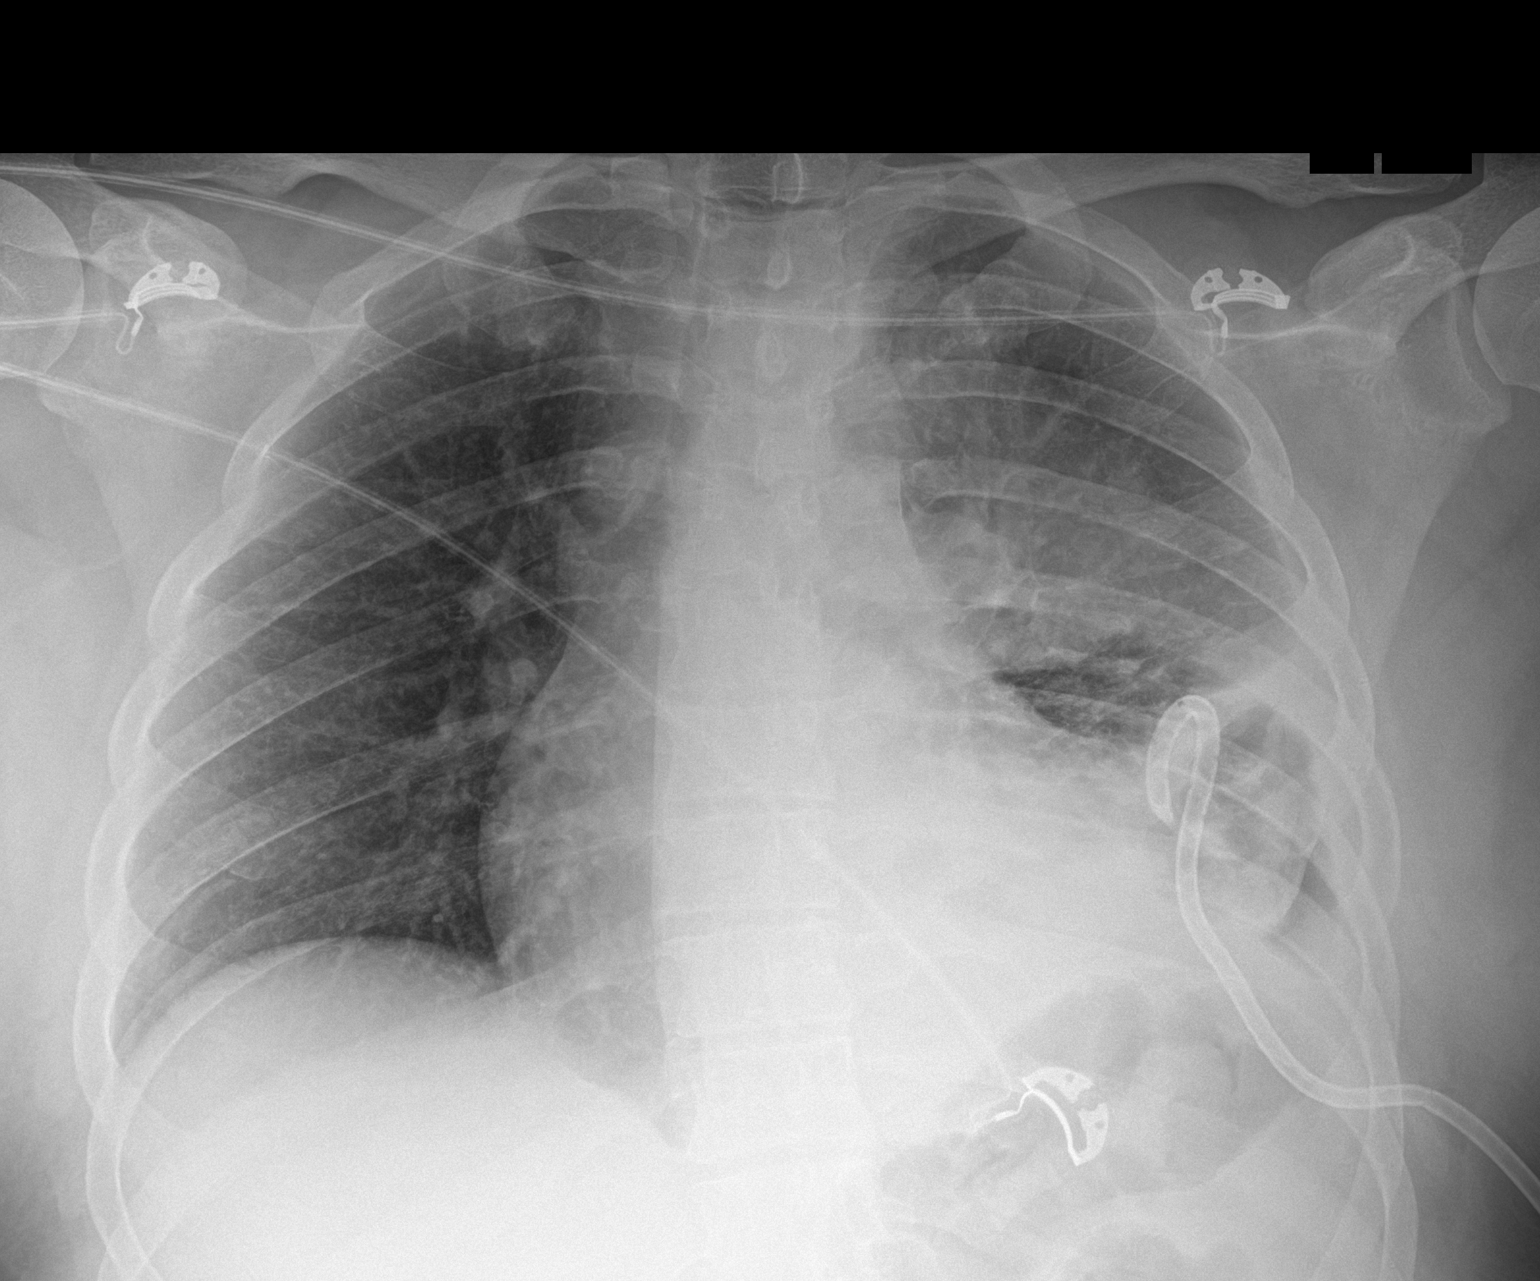

[1 of 1 positions shown; findings below may reference images not displayed]

FINDINGS: Interval placement of a left basilar pigtail thoracostomy tube.
Decreased size of the left-sided empyema with increased expansion of
the left lung. Left basilar airspace consolidation. The heart size
and mediastinal contours are unchanged. The visualized skeletal
structures are unchanged.
IMPRESSION: Interval placement of left basilar pigtail thoracostomy tube with
decreased size of the left-sided empyema.

## 2021-06-02 IMAGING — DX DG CHEST 1V PORT
1 series · 1 of 1 positions shown · non-contrast
Comparison: Chest x-ray [DATE].

CLINICAL DATA: 30-year-old female with sepsis. Bilateral leg
swelling. History of IV drug abuse.

EXAM:
PORTABLE CHEST 1 VIEW

[chest ap]
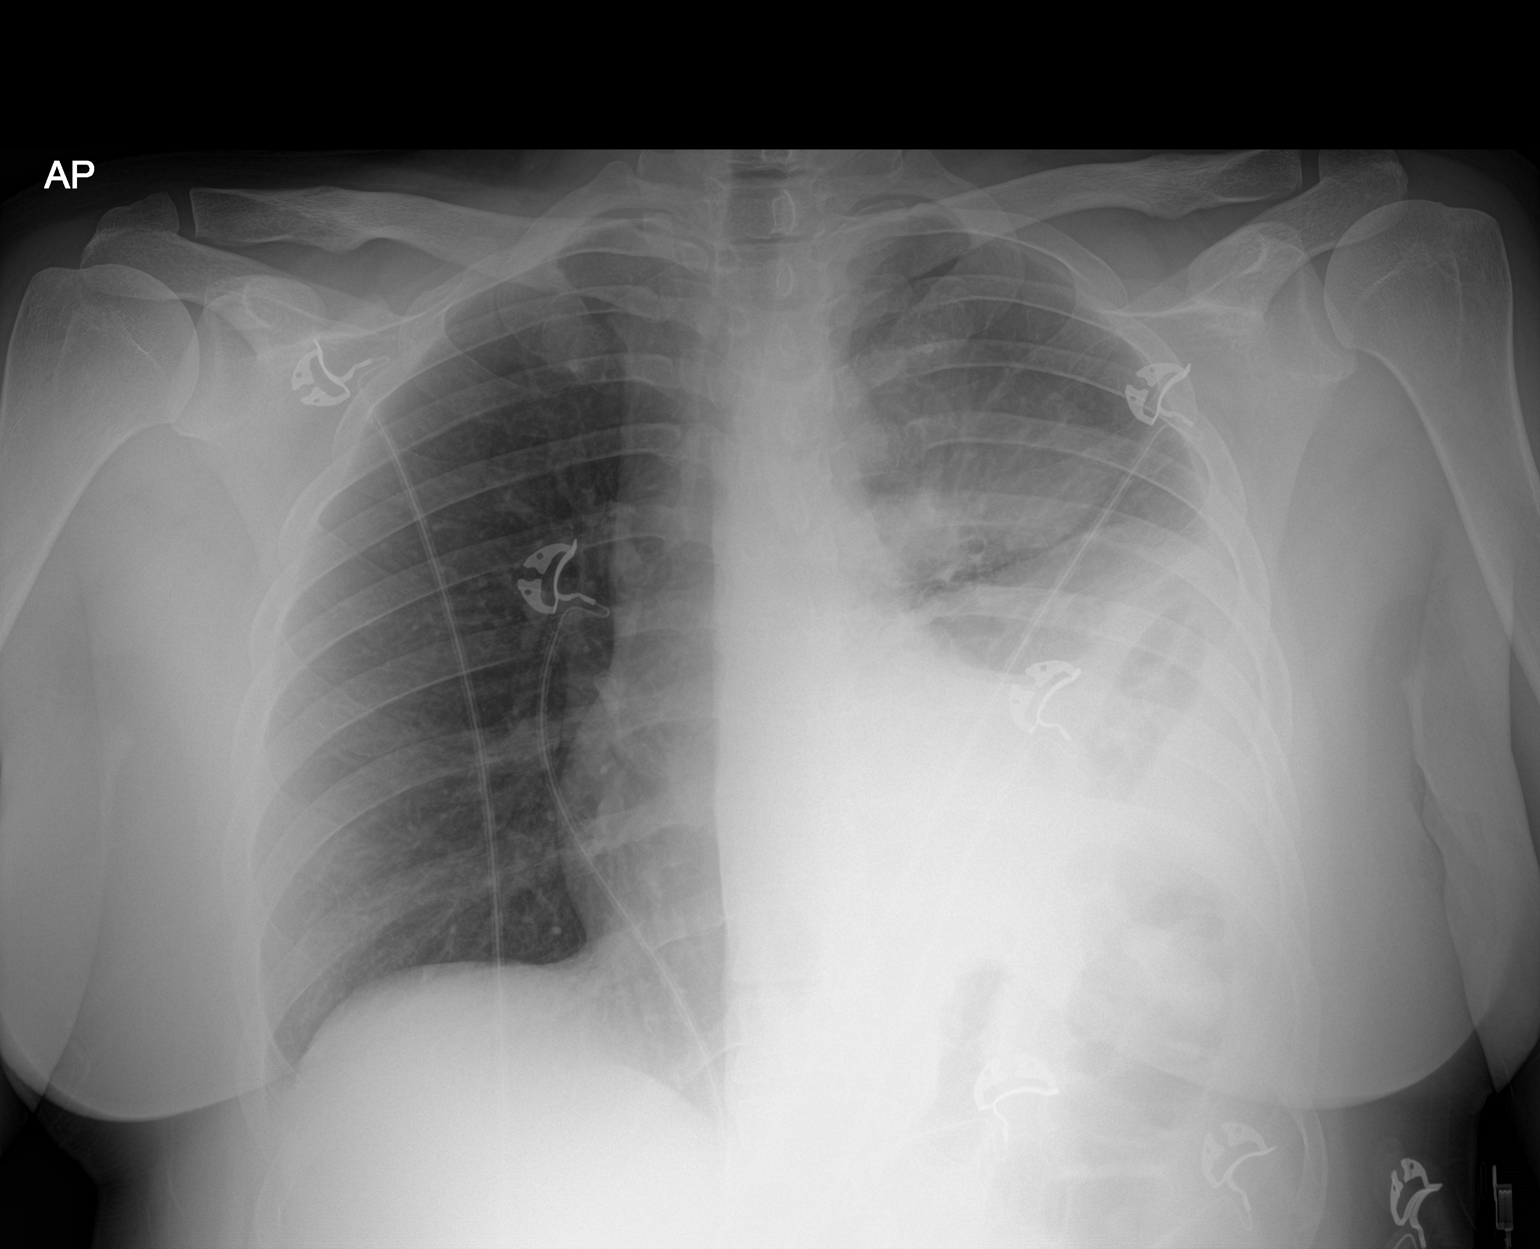

[1 of 1 positions shown; findings below may reference images not displayed]

FINDINGS: Opacity throughout the left hemithorax, favored to reflect a
combination of atelectasis and/or consolidation in the left lung
base, likely with large left pleural effusion, however, there are
some irregular lucencies within the left mid to lower hemithorax.
Right lung appears clear. No right pleural effusion. No
pneumothorax. No evidence of pulmonary edema. Heart size is normal.
Upper mediastinal contours are within normal limits.
IMPRESSION: 1. Grossly abnormal chest x-ray with unusual features. Findings
suggest a combination of airspace consolidation, atelectasis and
complex pleural fluid collection in the left hemithorax. Correlation
with forthcoming chest CTA is recommended to better characterize
these findings.

## 2021-06-02 SURGERY — CHEST TUBE INSERTION
Anesthesia: LOCAL

## 2021-06-02 MED ORDER — ACETAMINOPHEN 325 MG PO TABS
650.0000 mg | ORAL_TABLET | Freq: Four times a day (QID) | ORAL | Status: DC | PRN
  Administered 2021-06-07 – 2021-06-08 (×3): 650 mg via ORAL
  Filled 2021-06-02 (×4): qty 2

## 2021-06-02 MED ORDER — LORAZEPAM 2 MG/ML IJ SOLN
1.0000 mg | Freq: Once | INTRAMUSCULAR | Status: AC
  Administered 2021-06-02: 1 mg via INTRAVENOUS
  Filled 2021-06-02: qty 1

## 2021-06-02 MED ORDER — LACTATED RINGERS IV BOLUS (SEPSIS)
1000.0000 mL | Freq: Once | INTRAVENOUS | Status: AC
  Administered 2021-06-02: 1000 mL via INTRAVENOUS

## 2021-06-02 MED ORDER — FENTANYL CITRATE (PF) 100 MCG/2ML IJ SOLN
INTRAMUSCULAR | Status: AC
  Filled 2021-06-02: qty 2

## 2021-06-02 MED ORDER — NICOTINE 21 MG/24HR TD PT24
21.0000 mg | MEDICATED_PATCH | Freq: Every day | TRANSDERMAL | Status: DC
  Administered 2021-06-02 – 2021-06-10 (×10): 21 mg via TRANSDERMAL
  Filled 2021-06-02 (×10): qty 1

## 2021-06-02 MED ORDER — VANCOMYCIN HCL IN DEXTROSE 1-5 GM/200ML-% IV SOLN
1000.0000 mg | Freq: Two times a day (BID) | INTRAVENOUS | Status: DC
  Administered 2021-06-02 – 2021-06-05 (×7): 1000 mg via INTRAVENOUS
  Filled 2021-06-02 (×7): qty 200

## 2021-06-02 MED ORDER — LORAZEPAM 2 MG/ML IJ SOLN
INTRAMUSCULAR | Status: AC
  Filled 2021-06-02: qty 1

## 2021-06-02 MED ORDER — ACETAMINOPHEN 650 MG RE SUPP: 650.0000 mg | Freq: Four times a day (QID) | RECTAL | Status: DC | PRN

## 2021-06-02 MED ORDER — IOHEXOL 350 MG/ML SOLN
100.0000 mL | Freq: Once | INTRAVENOUS | Status: AC | PRN
  Administered 2021-06-02: 100 mL via INTRAVENOUS

## 2021-06-02 MED ORDER — LACTATED RINGERS IV SOLN: INTRAVENOUS | Status: AC

## 2021-06-02 MED ORDER — POTASSIUM CHLORIDE CRYS ER 20 MEQ PO TBCR
40.0000 meq | EXTENDED_RELEASE_TABLET | Freq: Once | ORAL | Status: DC
  Filled 2021-06-02: qty 2

## 2021-06-02 MED ORDER — LORAZEPAM 2 MG/ML IJ SOLN
INTRAMUSCULAR | Status: DC | PRN
  Administered 2021-06-02: 2 mg via INTRAVENOUS

## 2021-06-02 MED ORDER — LORAZEPAM 2 MG/ML IJ SOLN
2.0000 mg | Freq: Once | INTRAMUSCULAR | Status: AC
  Administered 2021-06-03: 2 mg via INTRAVENOUS
  Filled 2021-06-02: qty 1

## 2021-06-02 MED ORDER — SODIUM CHLORIDE (PF) 0.9 % IJ SOLN
10.0000 mg | Freq: Once | INTRAMUSCULAR | Status: AC
  Administered 2021-06-02: 10 mg via INTRAPLEURAL
  Filled 2021-06-02: qty 10

## 2021-06-02 MED ORDER — BACID PO TABS: 2.0000 | ORAL_TABLET | Freq: Three times a day (TID) | ORAL | Status: DC

## 2021-06-02 MED ORDER — FENTANYL CITRATE (PF) 100 MCG/2ML IJ SOLN
INTRAMUSCULAR | Status: DC | PRN
  Administered 2021-06-02: 100 ug via INTRAVENOUS

## 2021-06-02 MED ORDER — LIDOCAINE HCL (PF) 1 % IJ SOLN
5.0000 mL | Freq: Once | INTRAMUSCULAR | Status: AC
  Administered 2021-06-02: 5 mL
  Filled 2021-06-02: qty 5

## 2021-06-02 MED ORDER — METRONIDAZOLE 500 MG/100ML IV SOLN
500.0000 mg | Freq: Two times a day (BID) | INTRAVENOUS | Status: DC
  Administered 2021-06-03: 500 mg via INTRAVENOUS
  Filled 2021-06-02: qty 100

## 2021-06-02 MED ORDER — ALPRAZOLAM 0.25 MG PO TABS
0.2500 mg | ORAL_TABLET | Freq: Three times a day (TID) | ORAL | Status: DC | PRN
  Administered 2021-06-02 – 2021-06-05 (×8): 0.25 mg via ORAL
  Filled 2021-06-02 (×8): qty 1

## 2021-06-02 MED ORDER — MORPHINE SULFATE (PF) 4 MG/ML IV SOLN
4.0000 mg | Freq: Once | INTRAVENOUS | Status: AC
  Administered 2021-06-02: 4 mg via INTRAVENOUS
  Filled 2021-06-02: qty 1

## 2021-06-02 MED ORDER — RISAQUAD PO CAPS
2.0000 | ORAL_CAPSULE | Freq: Every day | ORAL | Status: DC
Start: 2021-06-02 — End: 2021-06-10
  Administered 2021-06-02 – 2021-06-10 (×8): 2 via ORAL
  Filled 2021-06-02 (×9): qty 2

## 2021-06-02 MED ORDER — IPRATROPIUM-ALBUTEROL 0.5-2.5 (3) MG/3ML IN SOLN: 3.0000 mL | Freq: Four times a day (QID) | RESPIRATORY_TRACT | Status: DC | PRN

## 2021-06-02 MED ORDER — CLONIDINE HCL 0.1 MG PO TABS
0.1000 mg | ORAL_TABLET | Freq: Three times a day (TID) | ORAL | Status: DC | PRN
  Administered 2021-06-04 – 2021-06-07 (×5): 0.1 mg via ORAL
  Filled 2021-06-02 (×5): qty 1

## 2021-06-02 MED ORDER — SODIUM CHLORIDE 0.9% FLUSH
10.0000 mL | Freq: Three times a day (TID) | INTRAVENOUS | Status: DC
  Administered 2021-06-02 – 2021-06-08 (×18): 10 mL

## 2021-06-02 MED ORDER — TETANUS-DIPHTH-ACELL PERTUSSIS 5-2.5-18.5 LF-MCG/0.5 IM SUSY
0.5000 mL | PREFILLED_SYRINGE | Freq: Once | INTRAMUSCULAR | Status: AC
  Administered 2021-06-02: 0.5 mL via INTRAMUSCULAR
  Filled 2021-06-02: qty 0.5

## 2021-06-02 MED ORDER — KETOROLAC TROMETHAMINE 30 MG/ML IJ SOLN
30.0000 mg | Freq: Four times a day (QID) | INTRAMUSCULAR | Status: AC | PRN
  Administered 2021-06-03 – 2021-06-07 (×14): 30 mg via INTRAVENOUS
  Filled 2021-06-02 (×16): qty 1

## 2021-06-02 MED ORDER — HYDROXYZINE HCL 50 MG/ML IM SOLN
25.0000 mg | Freq: Four times a day (QID) | INTRAMUSCULAR | Status: DC | PRN
  Filled 2021-06-02 (×2): qty 0.5

## 2021-06-02 MED ORDER — VANCOMYCIN HCL IN DEXTROSE 1-5 GM/200ML-% IV SOLN
1000.0000 mg | INTRAVENOUS | Status: AC
  Administered 2021-06-02 (×2): 1000 mg via INTRAVENOUS
  Filled 2021-06-02 (×2): qty 200

## 2021-06-02 MED ORDER — SODIUM CHLORIDE 0.9 % IV SOLN
2.0000 g | Freq: Once | INTRAVENOUS | Status: AC
  Administered 2021-06-02: 2 g via INTRAVENOUS
  Filled 2021-06-02: qty 2

## 2021-06-02 MED ORDER — FENTANYL CITRATE PF 50 MCG/ML IJ SOSY
100.0000 ug | PREFILLED_SYRINGE | Freq: Once | INTRAMUSCULAR | Status: AC
  Administered 2021-06-02: 100 ug via INTRAVENOUS
  Filled 2021-06-02 (×2): qty 2

## 2021-06-02 MED ORDER — SODIUM CHLORIDE 0.9 % IV SOLN
2.0000 g | Freq: Three times a day (TID) | INTRAVENOUS | Status: DC
  Administered 2021-06-02 – 2021-06-08 (×18): 2 g via INTRAVENOUS
  Filled 2021-06-02 (×22): qty 2

## 2021-06-02 MED ORDER — GUAIFENESIN ER 600 MG PO TB12
1200.0000 mg | ORAL_TABLET | Freq: Two times a day (BID) | ORAL | Status: DC
  Administered 2021-06-02 – 2021-06-10 (×16): 1200 mg via ORAL
  Filled 2021-06-02 (×17): qty 2

## 2021-06-02 MED ORDER — STERILE WATER FOR INJECTION IJ SOLN
5.0000 mg | Freq: Once | RESPIRATORY_TRACT | Status: AC
  Administered 2021-06-02: 5 mg via INTRAPLEURAL
  Filled 2021-06-02: qty 5

## 2021-06-02 MED ORDER — ENOXAPARIN SODIUM 40 MG/0.4ML IJ SOSY
40.0000 mg | PREFILLED_SYRINGE | INTRAMUSCULAR | Status: DC
  Administered 2021-06-03 – 2021-06-09 (×7): 40 mg via SUBCUTANEOUS
  Filled 2021-06-02 (×8): qty 0.4

## 2021-06-02 MED ORDER — PIPERACILLIN-TAZOBACTAM 3.375 G IVPB: 3.3750 g | Freq: Three times a day (TID) | INTRAVENOUS | Status: DC

## 2021-06-02 NOTE — ED Notes (Signed)
Per previous nurse unable to get second set of blood cultures and Provider was made aware.  Antibiotics have been started prior to my arrival.

## 2021-06-02 NOTE — Progress Notes (Addendum)
Pharmacy Antibiotic Note  Sharon [name] is a 30 y.o. female admitted on 06/02/2021 with  leg wound infections .  Pharmacy has been consulted for vancomycin and zosyn dosing.  Received one dose of cefepime ~ 6 am today, and one dose of 1g vancomycin at 9 am.  Scr 0.69 with CrCl ~ 100 ml/min  Plan: Zosyn 3.375g IV q8h (4 hour infusion). Vancomycin 1g IV q 12 hrs (calculated AUC 536)  Addendum: Asked to change Zosyn to cefepime 2g q 8 hrs.  Height: 5\' 3"  (160 cm) Weight: 86.4 kg (190 lb 8 oz) IBW/kg (Calculated) : 52.4  Temp (24hrs), Avg:99.2 F (37.3 C), Min:99.2 F (37.3 C), Max:99.2 F (37.3 C)  Recent Labs  Lab 06/02/21 0221 06/02/21 0450  WBC 20.5*  --   CREATININE 0.69  --   LATICACIDVEN  --  1.7    Estimated Creatinine Clearance: 107.1 mL/min (by C-G formula based on SCr of 0.69 mg/dL).    No Known Allergies  Antimicrobials this admission: Vancomycin 12/7 >  Cefepime 12/7 >   Dose adjustments this admission:  Microbiology results: 12/8 BCx x 2 > (I think drawn after dose of abx) 12/8 UCx >   Thank you for allowing pharmacy to be a part of this patient's care.  14/8, Reece Leader, BCCP Clinical Pharmacist  06/02/2021 1:45 PM   [facility] pharmacy phone numbers are listed on [email or web address]

## 2021-06-02 NOTE — Progress Notes (Signed)
Informed by ED physician at [facility] that there was concern for septic right ankle in this patient. Per Dr Veda Canning orders, ED physician attempted aspiration of the right ankle so that we could get a head start on culture of the right ankle. Aspiration was unsuccessful. Patient is being admitted and transferred to [facility] under medicine service. Ortho will plan on seeing the patient once she is at [facility].   [employer], PA-C 06/02/21 1120

## 2021-06-02 NOTE — ED Provider Notes (Signed)
[facility] EMERGENCY DEPARTMENT Provider Note   CSN: [id number] Arrival date & time: 06/02/21  0026     History Chief Complaint  Patient presents with   Abscess    Sharon [name] is a 30 y.o. female.  The history is provided by the patient and a significant other.  Abscess Sharon [name] is a 30 y.o. female who presents to the Emergency Department complaining of ankle pain.  She presents to the ED for evaluation of leg pain.  She has concerns regarding wounds to her legs and ankle pain.  One week ago woke up pain and swelling to right ankle, lasted three days and started to get better and she could limp around some and then three days ago started to return and now is starting to worsen.    She also has a wound to her left calf that started about four days ago, started to pop and drain last night.  She has a new abscess forming to the right calf starting last night.    Feels feverish for the last three days.  About one week ago she developed some chest pain that she thought was a pulled muscle/broken rib.  Cough for the last few days.  No nausea, vomiting, diarrhea, abdominal pain.  Has dark urine.  No prior similar sxs.    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  No additional medical problems.  No history of bloodstream infection.    Past Medical History:  Diagnosis Date   Anxiety    no meds   Arthritis    right lower arm tendonitis   Carpal tunnel syndrome    Headache(784.0)    otc med prn   IV drug abuse (HCC)    Substance abuse [facility])     Patient Active Problem List   Diagnosis Date Noted   IV drug abuse (HCC) 06/02/2021   Substance abuse (HCC) 06/02/2021   Sepsis (HCC) 06/02/2021   S/P cesarean section 04/03/2019   Methadone maintenance treatment complicating pregnancy, antepartum, third trimester (HCC) 04/03/2019   Acute encephalopathy 02/10/2017   Previous cesarean delivery, delivered, with or without  mention of antepartum condition 05/17/2012    Past Surgical History:  Procedure Laterality Date   CESAREAN SECTION  05/16/2012   Procedure: CESAREAN SECTION;  Surgeon: Brock Bad, MD;  Location: [facility] ORS;  Service: Obstetrics;  Laterality: N/A;  Primary   CESAREAN SECTION N/A 04/02/2019   Procedure: CESAREAN SECTION;  Surgeon: Lazaro Arms, MD;  Location: [facility] LD ORS;  Service: Obstetrics;  Laterality: N/A;   FOOT SURGERY  1997   removal foreign body (sewing needle)   TOOTH EXTRACTION  2008   upper incisor x2     OB History     Gravida  2   Para  2   Term  2   Preterm      AB      Living  2      SAB      IAB      Ectopic      Multiple  0   Live Births  2        Obstetric Comments  1 st pregnancy , unplanned         Family History  Problem Relation Age of Onset   Seizures Father    Diabetes Other    CAD Other    Hypertension Other    Anesthesia  problems Neg Hx    Hypotension Neg Hx    Malignant hyperthermia Neg Hx    Pseudochol deficiency Neg Hx     Social History   Tobacco Use   Smoking status: Every Day    Packs/day: 1.00    Years: 4.00    Pack years: 4.00    Types: Cigarettes   Smokeless tobacco: Former  Building services engineer Use: Former  Substance Use Topics   Alcohol use: No    Comment: ocassionaly    Drug use: Yes    Types: Marijuana, Other-see comments, Oxycodone, IV    Comment: hx - weekly vicodin use, benzo abuse    Home Medications Prior to Admission medications   Medication Sig Start Date End Date Taking? Authorizing Provider  ALPRAZolam Prudy Feeler) 1 MG tablet Take 1 mg by mouth at bedtime as needed for anxiety.    [provider]  ibuprofen (ADVIL) 600 MG tablet Take 1 tablet (600 mg total) by mouth every 6 (six) hours. 04/04/19   Arvilla Market, MD  senna-docusate (SENOKOT-S) 8.6-50 MG tablet Take 2 tablets by mouth daily. Patient not taking: Reported on 05/28/2019 04/05/19   Arvilla Market,  MD    Allergies    Patient has no known allergies.  Review of Systems   Review of Systems  All other systems reviewed and are negative.  Physical Exam Updated Vital Signs BP (!) 103/53   Pulse (!) 115   Temp 99.2 F (37.3 C) (Oral)   Resp (!) 21   Ht 5\' 3"  (1.6 m)   Wt 86.4 kg   SpO2 100%   BMI 33.75 kg/m   Physical Exam Vitals and nursing note reviewed.  Constitutional:      Appearance: She is well-developed.  HENT:     Head: Normocephalic and atraumatic.  Cardiovascular:     Rate and Rhythm: Regular rhythm. Tachycardia present.     Heart sounds: Murmur heard.  Pulmonary:     Effort: Pulmonary effort is normal. No respiratory distress.     Breath sounds: Normal breath sounds.  Abdominal:     Palpations: Abdomen is soft.     Tenderness: There is no abdominal tenderness. There is no guarding or rebound.  Musculoskeletal:        General: Swelling and tenderness present.     Comments: BLE with 2+  DP pulses.  Left calf with spontaneously draining abscess with surrounding cellulitis.  Right calf with TTP and soft tissue swelling, induration.  Right ankle/foot with significant erythema and edema, predominantly over the lateral malleolous with decreased ROM and significant TTP  Skin:    General: Skin is warm and dry.  Neurological:     Mental Status: She is alert and oriented to person, place, and time.  Psychiatric:        Behavior: Behavior normal.         ED Results / Procedures / Treatments   Labs (all labs ordered are listed, but only abnormal results are displayed) Labs Reviewed  CBC WITH DIFFERENTIAL/PLATELET - Abnormal; Notable for the following components:      Result Value   WBC 20.5 (*)    Hemoglobin 8.7 (*)    HCT 28.8 (*)    MCV 74.2 (*)    MCH 22.4 (*)    RDW 19.7 (*)    Platelets 489 (*)    Neutro Abs 15.4 (*)    Abs Immature Granulocytes 0.25 (*)    All other components within  normal limits  COMPREHENSIVE METABOLIC PANEL - Abnormal;  Notable for the following components:   Sodium 131 (*)    Potassium 3.2 (*)    Chloride 95 (*)    Glucose, Bld 108 (*)    Calcium 7.8 (*)    Total Protein 8.4 (*)    Albumin 2.0 (*)    Total Bilirubin 1.8 (*)    All other components within normal limits  PROTIME-INR - Abnormal; Notable for the following components:   Prothrombin Time 15.5 (*)    All other components within normal limits  RESP PANEL BY RT-PCR (FLU A&B, COVID) ARPGX2  CULTURE, BLOOD (ROUTINE X 2)  CULTURE, BLOOD (ROUTINE X 2)  URINE CULTURE  LACTIC ACID, PLASMA  APTT  URINALYSIS, ROUTINE W REFLEX MICROSCOPIC  PREGNANCY, URINE    EKG EKG Interpretation  Date/Time:  Thursday June 02 2021 04:12:23 EST Ventricular Rate:  114 PR Interval:  136 QRS Duration: 76 QT Interval:  307 QTC Calculation: 423 R Axis:   69 Text Interpretation: Sinus tachycardia Atrial premature complex Borderline T wave abnormalities Confirmed by Tilden Fossa [phone]) on 06/02/2021 4:22:15 AM  Radiology DG Ankle Complete Right  Result Date: 06/02/2021 CLINICAL DATA:  30 year old female with sepsis. Bilateral leg swelling. EXAM: RIGHT ANKLE - COMPLETE 3+ VIEW COMPARISON:  No priors. FINDINGS: There is no evidence of fracture, dislocation, or joint effusion. There is no evidence of arthropathy or other focal bone abnormality. Soft tissues are diffusely swollen. IMPRESSION: 1. Diffuse swelling of the soft tissues around the right ankle joint. No acute osseous abnormality. Electronically Signed   By: Trudie Reed M.D.   On: 06/02/2021 05:12   CT Angio Chest PE W/Cm &/Or Wo Cm  Result Date: 06/02/2021 CLINICAL DATA:  30 year old female with history of IV drug abuse. Possible septic emboli. Possible infectious endocarditis. EXAM: CT ANGIOGRAPHY CHEST WITH CONTRAST TECHNIQUE: Multidetector CT imaging of the chest was performed using the standard protocol during bolus administration of intravenous contrast. Multiplanar CT image  reconstructions and MIPs were obtained to evaluate the vascular anatomy. CONTRAST:  OMNIPAQUE IOHEXOL 350 MG/ML SOLN COMPARISON:  No priors. FINDINGS: Cardiovascular: There are no filling defects within the pulmonary arterial tree to suggest pulmonary embolism. Heart size is normal. There is no significant pericardial fluid, thickening or pericardial calcification. No atherosclerotic calcifications are noted in the thoracic aorta or the coronary arteries. Mediastinum/Nodes: Prominent but nonenlarged mediastinal and left hilar lymph nodes are nonspecific, but likely reactive. Esophagus is unremarkable in appearance. No axillary lymphadenopathy. However, there is extensive soft tissue stranding and what appears to be some fluid in the right axillary region. A small amount of gas is also noted in the soft tissues of the proximal right upper extremity, incompletely imaged. Lungs/Pleura: Large complex left pleural effusion with extensive pleural enhancement which appears partially loculated. This is associated with extensive areas of passive atelectasis in the left lung, including near complete atelectasis of the left lower lobe. Some cavitary areas are noted in the inferior segment of the lingula. Right lung is clear. No right pleural effusion. No definite suspicious appearing pulmonary nodules or masses are noted. Upper Abdomen: Spleen is incompletely imaged, but appears likely enlarged measuring approximally 13.6 x 5.6 cm on axial images. Musculoskeletal: There are no aggressive appearing lytic or blastic lesions noted in the visualized portions of the skeleton. Review of the MIP images confirms the above findings. IMPRESSION: 1. No evidence of pulmonary embolism. 2. Area of cavitation in the inferior segment of the lingula,  likely secondary to necrotizing pneumonia. This is associated with a large left-sided empyema, and extensive passive atelectasis in the left lung, most notably in the left lower lobe. 3.  Probable splenomegaly. Electronically Signed   By: Trudie Reed M.D.   On: 06/02/2021 06:54   DG Chest Port 1 View  Result Date: 06/02/2021 CLINICAL DATA:  30 year old female with sepsis. Bilateral leg swelling. History of IV drug abuse. EXAM: PORTABLE CHEST 1 VIEW COMPARISON:  Chest x-ray 03/29/2018. FINDINGS: Opacity throughout the left hemithorax, favored to reflect a combination of atelectasis and/or consolidation in the left lung base, likely with large left pleural effusion, however, there are some irregular lucencies within the left mid to lower hemithorax. Right lung appears clear. No right pleural effusion. No pneumothorax. No evidence of pulmonary edema. Heart size is normal. Upper mediastinal contours are within normal limits. IMPRESSION: 1. Grossly abnormal chest x-ray with unusual features. Findings suggest a combination of airspace consolidation, atelectasis and complex pleural fluid collection in the left hemithorax. Correlation with forthcoming chest CTA is recommended to better characterize these findings. Electronically Signed   By: Trudie Reed M.D.   On: 06/02/2021 05:11    Procedures .Joint Aspiration/Arthrocentesis  Date/Time: 06/02/2021 7:38 AM Performed by: Tilden Fossa, MD Authorized by: Tilden Fossa, MD   Consent:    Consent obtained:  Verbal   Consent given by:  Patient   Risks discussed:  Bleeding, infection, pain, nerve damage and incomplete drainage   Alternatives discussed:  Observation Universal protocol:    Procedure explained and questions answered to patient or proxy's satisfaction: yes     Patient identity confirmed:  Verbally with patient Location:    Location:  Ankle   Ankle:  R ankle Anesthesia:    Anesthesia method:  Local infiltration   Local anesthetic:  Lidocaine 1% w/o epi Procedure details:    Preparation: Patient was prepped and draped in usual sterile fashion     Needle gauge:  18 G   Approach:  Medial   Aspirate amount:  Less  than 1 cc   Aspirate characteristics:  Bloody   Steroid injected: no     Specimen collected: no    CRITICAL CARE Performed by: Tilden Fossa   Total critical care time: 45 minutes  Critical care time was exclusive of separately billable procedures and treating other patients.  Critical care was necessary to treat or prevent imminent or life-threatening deterioration.  Critical care was time spent personally by me on the following activities: development of treatment plan with patient and/or surrogate as well as nursing, discussions with consultants, evaluation of patient's response to treatment, examination of patient, obtaining history from patient or surrogate, ordering and performing treatments and interventions, ordering and review of laboratory studies, ordering and review of radiographic studies, pulse oximetry and re-evaluation of patient's condition.  Medications Ordered in ED Medications  lactated ringers infusion ( Intravenous New Bag/Given 06/02/21 0651)  vancomycin (VANCOCIN) IVPB 1000 mg/200 mL premix (1,000 mg Intravenous New Bag/Given 06/02/21 0739)  lactated ringers bolus 1,000 mL (0 mLs Intravenous Stopped 06/02/21 0500)  Tdap (BOOSTRIX) injection 0.5 mL (0.5 mLs Intramuscular Given 06/02/21 0426)  LORazepam (ATIVAN) injection 1 mg (1 mg Intravenous Given 06/02/21 0424)  morphine 4 MG/ML injection 4 mg (4 mg Intravenous Given 06/02/21 0421)  iohexol (OMNIPAQUE) 350 MG/ML injection 100 mL (100 mLs Intravenous Contrast Given 06/02/21 0616)  ceFEPIme (MAXIPIME) 2 g in sodium chloride 0.9 % 100 mL IVPB (2 g Intravenous New Bag/Given 06/02/21 0655)  morphine  4 MG/ML injection 4 mg (4 mg Intravenous Given 06/02/21 0709)  LORazepam (ATIVAN) injection 1 mg (1 mg Intravenous Given 06/02/21 0711)  lidocaine (PF) (XYLOCAINE) 1 % injection 5 mL (5 mLs Infiltration Given 06/02/21 6222)    ED Course  I have reviewed the triage vital signs and the nursing notes.  Pertinent labs & imaging  results that were available during my care of the patient were reviewed by me and considered in my medical decision making (see chart for details).    MDM Rules/Calculators/A&P                          Patient with history of IV drug abuse here for evaluation of pain to her legs.  She is ill-appearing on evaluation with tachycardia, new systolic murmur.  She has a spontaneously draining abscess to the left medial leg.  She also has what feels like a developing abscess to the right medial leg.  She has significant soft tissue swelling and tenderness to the right ankle, predominantly laterally with decreased range of motion concerning for possible septic joint versus cellulitis.  Labs significant for leukocytosis, hyponatremia.  Chest x-ray is concerning for a complicated fluid collection in the left chest.  Plan to obtain a CTA to further evaluate.  Her lactic acid is within normal limits.  She is perfusing well but given her multiple areas of acute active infection, persistent tachycardia and leukocytosis broad-spectrum antibiotics were started for concern for endocarditis with multiple septic emboli.  Discussed with on-call provider with orthopedics for [facility], will see the patient in consult when she arrives to hospital.  There is recommendation for attempt at joint aspiration.  Attempt made but unable to have return of fluid.  Medicine consulted for admission for ongoing treatment.  Patient updated of findings of studies and recommendation for admission and she is in agreement with treatment plan.  Final Clinical Impression(s) / ED Diagnoses Final diagnoses:  Sepsis with acute organ dysfunction without septic shock, due to unspecified organism, unspecified type (HCC)  Empyema lung (HCC)  Necrotizing pneumonia (HCC)  Abscess    Rx / DC Orders ED Discharge Orders     None        Tilden Fossa, MD 06/02/21 [phone]

## 2021-06-02 NOTE — Progress Notes (Signed)
Notified by EDP of need for admission d/t sepsis secondary to PNA. [facility] accepts patient to progressive at [facility]. EDP is to remain responsible for orders/medical decisions while patient is holding at [facility]. Upon arrival to [facility], [facility] will assume care. Nursing staff will call patient placement to notify them of patient's arrival so that the proper [facility] member may receive the patient. Thank you.

## 2021-06-02 NOTE — Progress Notes (Signed)
Dr Ave Filter and I attempted to go the patient this afternoon as we have been awaiting her transfer to [facility] since this morning. Unfortunately she was not in the room as she was in endoscopy for chest tube placement. Dr Ave Filter has now discussed the patient's case with Charma Igo, PA-C, orthopedic physician assistant at [facility]. Plan is for Charma Igo, PA-C to evaluate the patient's ankle in the morning. We will make the patient NPO after midnight in case surgical intervention is necessary for the right ankle.   [address] [place], [place] 06/02/21 1525

## 2021-06-02 NOTE — ED Triage Notes (Addendum)
C/o abscess to left thigh x 4 days , bil leg swelling , pt admits to IV drug use

## 2021-06-02 NOTE — Procedures (Signed)
Insertion of Chest Tube Procedure Note  Sharon [name]  [id number]  [birth year]  Date:06/02/21  Time:3:14 PM    Provider Performing: Shelby Mattocks   Procedure: Pleural Catheter Insertion w/ Imaging Guidance (17510)  Indication(s) Effusion  Consent Risks of the procedure as well as the alternatives and risks of each were explained to the patient and/or caregiver.  Consent for the procedure was obtained and is signed in the bedside chart  Anesthesia Topical only with 1% lidocaine    Time Out Verified patient identification, verified procedure, site/side was marked, verified correct patient position, special equipment/implants available, medications/allergies/relevant history reviewed, required imaging and test results available.   Sterile Technique Maximal sterile technique including full sterile barrier drape, hand hygiene, sterile gown, sterile gloves, mask, hair covering, sterile ultrasound probe cover (if used).   Procedure Description Ultrasound used to identify appropriate pleural anatomy for placement and overlying skin marked. Area of placement cleaned and draped in sterile fashion.  A 14 French pigtail pleural catheter was placed into the left pleural space using Seldinger technique. Appropriate return of pus was obtained.  The tube was connected to atrium and placed on -20 cm H2O wall suction.   Complications/Tolerance None; patient tolerated the procedure well. Chest X-ray is ordered to verify placement.   EBL Minimal  Specimen(s) fluid Sharon [name] ACNP-BC [facility] Pulmonary/Critical Care Pager # [phone] OR # [phone] if no answer

## 2021-06-02 NOTE — Progress Notes (Signed)
[facility], A/O x 4,  CCMD called ,CHG given, pt oriented to unit,Will continue to monitor. Pt has multi abrasions  on bilateral legs and arms.   Karna Christmas Aalyah Mansouri, RN    06/02/21 1331  Vitals  BP 109/65  MAP (mmHg) 76  BP Location Right Arm  BP Method Automatic  Patient Position (if appropriate) Lying  Pulse Rate (!) 102  Pulse Rate Source Monitor  Level of Consciousness  Level of Consciousness Alert  Oxygen Therapy  O2 Device Room Air  Patient Activity (if Appropriate) In bed  Pain Assessment  Pain Scale 0-10  Pain Score 8  MEWS Score  MEWS Temp 0  MEWS Systolic 0  MEWS Pulse 1  MEWS RR 0  MEWS LOC 0  MEWS Score 1  MEWS Score Color Green

## 2021-06-02 NOTE — Progress Notes (Addendum)
D/W ID attending Dr. Thedore Mins, given the CT finding will do a right upper arm MRI to rule out abscess, septic joints.  ID recommend [id number]+[id number]+Flagyl, changes made accordingly.

## 2021-06-02 NOTE — Consult Note (Addendum)
NAME:  Sharon [name], MRN:  [id number], DOB:  09/25/1990, LOS: 0 ADMISSION DATE:  06/02/2021, CONSULTATION DATE:  06/02/2021 REFERRING MD:  Dr. 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  Found to have abscess on Lt leg.  She had chest xray that showed large left pleural effusion.  CT chest showed area of cavitation in the inferior lingula and large Lt sided pleural effusion.  PCCM asked to assess for drainage of pleural effusion.  She had pain in her low chest from crying and coughing due to pain in her leg.  Her boyfriend reports she has felt warm.  Denies hemoptysis.  No chest pain, sweats, chills, or weight loss.  Denies difficulty swallowing.    Pertinent  Medical History  Anxiety, OA, Carpal tunnel, IVDA, Headache  Significant Hospital Events: Including procedures, antibiotic start and stop dates in addition to other pertinent events   12/08 Admit, ortho consulted, start ABx, Lt pig tail catheter placed  Interim History / Subjective:    Objective   Blood pressure 109/65, pulse (!) 102, temperature 98.6 F (37 C), temperature source Oral, resp. rate 12, height 5\' 3"  (1.6 m), weight 86.4 kg, SpO2 100 %.        Intake/Output Summary (Last 24 hours) at 06/02/2021 1411 Last data filed at 06/02/2021 1007 Gross per 24 hour  Intake 500 ml  Output --  Net 500 ml   Filed Weights   06/02/21 0036 06/02/21 0456  Weight: 89.8 kg 86.4 kg    Examination:  General - alert Eyes - pupils reactive ENT - no sinus tenderness, no stridor Cardiac - regular rate/rhythm, no murmur Chest - decreased BS Lt base Abdomen - soft, non tender, + bowel sounds Extremities - swelling of legs Lt > Rt Skin - abscess Lt calf, Rt ankle with erythema and edema Neuro - normal strength, moves extremities, follows commands Psych - anxious   Resolved Hospital Problem list      Assessment & Plan:   Lt pleural effusion concerning for empyema and cavitary pneumonia in setting of IVDA with leg abscess. - concern she might be developing early loculation of effusion - day 1 of ABx with vancomycin, zosyn - will arrange for pig tail catheter placement; procedure explained including risks/benefits/alternative therapies; risks details as bleeding, pneumothorax, infection; she is agreeable to proceed with pig tail insertion - will send pleural fluid for glucose, LDH, protein, cell count, culture, and AFB - might need tPA/dornase if effusion doesn't drain easily - f/u Echo to assess for endocarditis - f/u blood culture from 06/02/21  Anxiety, hx of IVDA. - will give ativan and fentanyl IV prior to inserting pig tail catheter  Leg abscess, Rt axillary stranding. - ortho consulted - continue ABx - f/u MRI Rt arm  Hypokalemia. - f/u BMET  Microcytic anemia. - f/u CBC - transfuse for Hb < 7  Best Practice (right click and "Reselect all SmartList Selections" daily)   Diet/type: NPO DVT prophylaxis: LMWH GI prophylaxis: N/A Lines: N/A Foley:  N/A Code Status:  full code Last date of multidisciplinary goals of care discussion [x]   Labs   CBC: Recent Labs  Lab 06/02/21 0221  WBC 20.5*  NEUTROABS 15.4*  HGB 8.7*  HCT 28.8*  MCV 74.2*  PLT 489*    Basic Metabolic Panel: Recent Labs  Lab 06/02/21 0221  NA 131*  K 3.2*  CL 95*  CO2 26  GLUCOSE 108*  BUN 10  CREATININE 0.69  CALCIUM 7.8*   GFR: Estimated Creatinine Clearance: 107.1 mL/min (by C-G formula based on SCr of 0.69 mg/dL). Recent Labs  Lab 06/02/21 0221 06/02/21 0450  WBC 20.5*  --   LATICACIDVEN  --  1.7    Liver Function Tests: Recent Labs  Lab 06/02/21 0221  AST 29  ALT 21  ALKPHOS 106  BILITOT 1.8*  PROT 8.4*  ALBUMIN 2.0*   No results for input(s): LIPASE, AMYLASE in the last 168 hours. No results for input(s): AMMONIA in the last 168 hours.  ABG No results  found for: PHART, PCO2ART, PO2ART, HCO3, TCO2, ACIDBASEDEF, O2SAT   Coagulation Profile: Recent Labs  Lab 06/02/21 0450  INR 1.2    Cardiac Enzymes: No results for input(s): CKTOTAL, CKMB, CKMBINDEX, TROPONINI in the last 168 hours.  HbA1C: No results found for: HGBA1C  CBG: No results for input(s): GLUCAP in the last 168 hours.  Review of Systems:   Reviewed and negative  Past Medical History:  She,  has a past medical history of Anxiety, Arthritis, Carpal tunnel syndrome, Headache(784.0), IV drug abuse (HCC), and Substance abuse (HCC).   Surgical History:   Past Surgical History:  Procedure Laterality Date   CESAREAN SECTION  05/16/2012   Procedure: CESAREAN SECTION;  Surgeon: Brock Bad, MD;  Location: [facility];  Service: Obstetrics;  Laterality: N/A;  Primary   CESAREAN SECTION N/A 04/02/2019   Procedure: CESAREAN SECTION;  Surgeon: Lazaro Arms, MD;  Location: [facility];  Service: Obstetrics;  Laterality: N/A;   FOOT SURGERY  1997   removal foreign body (sewing needle)   TOOTH EXTRACTION  2008   upper incisor x2     Social History:   reports that she has been smoking cigarettes. She has a 4.00 pack-year smoking history. She has quit using smokeless tobacco. She reports current drug use. Drugs: Marijuana, Other-see comments, Oxycodone, and IV. She reports that she does not drink alcohol.   Family History:  Her family history includes CAD in an other family member; Diabetes in an other family member; Hypertension in an other family member; Seizures in her father. There is no history of Anesthesia problems, Hypotension, Malignant hyperthermia, or Pseudochol deficiency.   Allergies No Known Allergies   Home Medications  Prior to Admission medications   Medication Sig Start Date End Date Taking? Authorizing Provider  ALPRAZolam Prudy Feeler) 1 MG tablet Take 1 mg by mouth at bedtime as needed for anxiety.   Yes [provider]  ibuprofen (ADVIL) 600 MG  tablet Take 1 tablet (600 mg total) by mouth every 6 (six) hours. Patient not taking: Reported on 06/02/2021 04/04/19   Arvilla Market, MD  senna-docusate (SENOKOT-S) 8.6-50 MG tablet Take 2 tablets by mouth daily. Patient not taking: Reported on 05/28/2019 04/05/19   Arvilla Market, MD     Signature:  Coralyn Helling, MD [facility] Pulmonary/Critical Care Pager - [phone] 06/02/2021, 2:28 PM

## 2021-06-02 NOTE — H&P (Addendum)
History and Physical    Sharon [name] OXB:[id number] DOB: 05-29-1991 DOA: 06/02/2021  PCP: Patient, No Pcp Per (Inactive) (Confirm with patient/family/NH records and if not entered, this has to be entered at [facility] point of entry) Patient coming from: Home  I have personally briefly reviewed patient's old medical records in [facility]  Chief Complaint: Feeling sick  HPI: [place] is a 30 y.o. female with medical history significant of IVDU, anxiety disorder, cigarette smoke came with multiple complaints including left leg abscess, right ankle swelling and pain, productive cough and fever.  Symptoms started about 3-4 days ago, when "there was a pimple showed up" in left calf and right ankle started to swell. She started to take Advil for the pain with some relief. She also started to have a productive cough about the same time with copious amount of yellow/greenish thick sputum. And a "sharp chest pain" which she thought she might torn some muscle in the chest. Moreover, she started to have cycles of subjective fever and chills.  In the next few days, for her symptoms including right ankle swelling and pain, abscess and cough gradually getting worse.  The abscess on the left calf opened up spontaneously yesterday.  Patient uses IV heroine regularly, last use was last week.  She says usually she injects on B/L arms.  ED Course: Patient was found afebrile, tachycardia, recent normal limits, blood pressure borderline low, patient was given IVF and started vancomycin and cefepime.  CT angiogram negative for PE but left lower lobe necrotizing pneumonia and empyema.  ED tried to do arthrocentesis on right ankle no yield of fluid  Review of Systems: As per HPI otherwise 14 point review of systems negative.    Past Medical History:  Diagnosis Date   Anxiety    no meds   Arthritis    right lower arm tendonitis   Carpal tunnel syndrome    Headache(784.0)    otc med prn   IV drug  abuse (HCC)    Substance abuse (HCC)     Past Surgical History:  Procedure Laterality Date   CESAREAN SECTION  05/16/2012   Procedure: CESAREAN SECTION;  Surgeon: Brock Bad, MD;  Location: [facility];  Service: Obstetrics;  Laterality: N/A;  Primary   CESAREAN SECTION N/A 04/02/2019   Procedure: CESAREAN SECTION;  Surgeon: Lazaro Arms, MD;  Location: [facility];  Service: Obstetrics;  Laterality: N/A;   FOOT SURGERY  1997   removal foreign body (sewing needle)   TOOTH EXTRACTION  2008   upper incisor x2     reports that she has been smoking cigarettes. She has a 4.00 pack-year smoking history. She has quit using smokeless tobacco. She reports current drug use. Drugs: Marijuana, Other-see comments, Oxycodone, and IV. She reports that she does not drink alcohol.  No Known Allergies  Family History  Problem Relation Age of Onset   Seizures Father    Diabetes Other    CAD Other    Hypertension Other    Anesthesia problems Neg Hx    Hypotension Neg Hx    Malignant hyperthermia Neg Hx    Pseudochol deficiency Neg Hx      Prior to Admission medications   Medication Sig Start Date End Date Taking? Authorizing Provider  ALPRAZolam Prudy Feeler) 1 MG tablet Take 1 mg by mouth at bedtime as needed for anxiety.    [provider]  ibuprofen (ADVIL) 600 MG tablet Take 1 tablet (600 mg  total) by mouth every 6 (six) hours. 04/04/19   Arvilla Market, MD  senna-docusate (SENOKOT-S) 8.6-50 MG tablet Take 2 tablets by mouth daily. Patient not taking: Reported on 05/28/2019 04/05/19   Arvilla Market, MD    Physical Exam: Vitals:   06/02/21 0730 06/02/21 1100 06/02/21 1130 06/02/21 1331  BP: (!) 103/53 99/61 (!) 91/57 109/65  Pulse: (!) 115 93 96 (!) 102  Resp: (!) 21 16 16 12   Temp:    98.6 F (37 C)  TempSrc:    Oral  SpO2: 100% 100% 99% 100%  Weight:      Height:        Constitutional: NAD, calm, comfortable Vitals:   06/02/21 0730 06/02/21 1100  06/02/21 1130 06/02/21 1331  BP: (!) 103/53 99/61 (!) 91/57 109/65  Pulse: (!) 115 93 96 (!) 102  Resp: (!) 21 16 16 12   Temp:    98.6 F (37 C)  TempSrc:    Oral  SpO2: 100% 100% 99% 100%  Weight:      Height:       Eyes: PERRL, lids and conjunctivae normal, chronic ill appearance ENMT: Mucous membranes are moist. Posterior pharynx clear of any exudate or lesions.Normal dentition.  Neck: normal, supple, no masses, no thyromegaly Respiratory: Diminished breathing sound on left side, no wheezing, no crackles. Normal respiratory effort. No accessory muscle use.  Cardiovascular: Regular rate and rhythm, no murmurs / rubs / gallops. No extremity edema. 2+ pedal pulses. No carotid bruits.  Abdomen: no tenderness, no masses palpated. No hepatosplenomegaly. Bowel sounds positive.  Musculoskeletal: Swelling rash and tenderness warm to touch on right ankle, significant decreased ROM.  Large abscess left upper calf/popliteal area, with yellow thick discharge. Skin: no rashes, lesions, ulcers. No induration Neurologic: CN 2-12 grossly intact. Sensation intact, DTR normal. Strength 5/5 in all 4.  Psychiatric: Normal judgment and insight. Alert and oriented x 3. Normal mood.       Labs on Admission: I have personally reviewed following labs and imaging studies  CBC: Recent Labs  Lab 06/02/21 0221  WBC 20.5*  NEUTROABS 15.4*  HGB 8.7*  HCT 28.8*  MCV 74.2*  PLT 489*   Basic Metabolic Panel: Recent Labs  Lab 06/02/21 0221  NA 131*  K 3.2*  CL 95*  CO2 26  GLUCOSE 108*  BUN 10  CREATININE 0.69  CALCIUM 7.8*   GFR: Estimated Creatinine Clearance: 107.1 mL/min (by C-G formula based on SCr of 0.69 mg/dL). Liver Function Tests: Recent Labs  Lab 06/02/21 0221  AST 29  ALT 21  ALKPHOS 106  BILITOT 1.8*  PROT 8.4*  ALBUMIN 2.0*   No results for input(s): LIPASE, AMYLASE in the last 168 hours. No results for input(s): AMMONIA in the last 168 hours. Coagulation  Profile: Recent Labs  Lab 06/02/21 0450  INR 1.2   Cardiac Enzymes: No results for input(s): CKTOTAL, CKMB, CKMBINDEX, TROPONINI in the last 168 hours. BNP (last 3 results) No results for input(s): PROBNP in the last 8760 hours. HbA1C: No results for input(s): HGBA1C in the last 72 hours. CBG: No results for input(s): GLUCAP in the last 168 hours. Lipid Profile: No results for input(s): CHOL, HDL, LDLCALC, TRIG, CHOLHDL, LDLDIRECT in the last 72 hours. Thyroid Function Tests: No results for input(s): TSH, T4TOTAL, FREET4, T3FREE, THYROIDAB in the last 72 hours. Anemia Panel: No results for input(s): VITAMINB12, FOLATE, FERRITIN, TIBC, IRON, RETICCTPCT in the last 72 hours. Urine analysis:    Component Value Date/Time  COLORURINE AMBER (A) 06/02/2021 0745   APPEARANCEUR HAZY (A) 06/02/2021 0745   LABSPEC 1.020 06/02/2021 0745   PHURINE 5.5 06/02/2021 0745   GLUCOSEU NEGATIVE 06/02/2021 0745   HGBUR TRACE (A) 06/02/2021 0745   BILIRUBINUR MODERATE (A) 06/02/2021 0745   KETONESUR NEGATIVE 06/02/2021 0745   PROTEINUR 30 (A) 06/02/2021 0745   NITRITE POSITIVE (A) 06/02/2021 0745   LEUKOCYTESUR NEGATIVE 06/02/2021 0745    Radiological Exams on Admission: DG Ankle Complete Right  Result Date: 06/02/2021 CLINICAL DATA:  30 year old female with sepsis. Bilateral leg swelling. EXAM: RIGHT ANKLE - COMPLETE 3+ VIEW COMPARISON:  No priors. FINDINGS: There is no evidence of fracture, dislocation, or joint effusion. There is no evidence of arthropathy or other focal bone abnormality. Soft tissues are diffusely swollen. IMPRESSION: 1. Diffuse swelling of the soft tissues around the right ankle joint. No acute osseous abnormality. Electronically Signed   By: Trudie Reed M.D.   On: 06/02/2021 05:12   CT Angio Chest PE W/Cm &/Or Wo Cm  Addendum Date: 06/02/2021   ADDENDUM REPORT: 06/02/2021 09:08 ADDENDUM: As mentioned in the body of the report there is extensive soft tissue stranding  in the subcutaneous fat of the right axillary region where there also appears to be a small amount of gas in the soft tissues. Clinical correlation for signs and symptoms of acute soft tissue infection in the right upper extremity is recommended. Electronically Signed   By: Trudie Reed M.D.   On: 06/02/2021 09:08   Result Date: 06/02/2021 CLINICAL DATA:  30 year old female with history of IV drug abuse. Possible septic emboli. Possible infectious endocarditis. EXAM: CT ANGIOGRAPHY CHEST WITH CONTRAST TECHNIQUE: Multidetector CT imaging of the chest was performed using the standard protocol during bolus administration of intravenous contrast. Multiplanar CT image reconstructions and MIPs were obtained to evaluate the vascular anatomy. CONTRAST:  OMNIPAQUE IOHEXOL 350 MG/ML SOLN COMPARISON:  No priors. FINDINGS: Cardiovascular: There are no filling defects within the pulmonary arterial tree to suggest pulmonary embolism. Heart size is normal. There is no significant pericardial fluid, thickening or pericardial calcification. No atherosclerotic calcifications are noted in the thoracic aorta or the coronary arteries. Mediastinum/Nodes: Prominent but nonenlarged mediastinal and left hilar lymph nodes are nonspecific, but likely reactive. Esophagus is unremarkable in appearance. No axillary lymphadenopathy. However, there is extensive soft tissue stranding and what appears to be some fluid in the right axillary region. A small amount of gas is also noted in the soft tissues of the proximal right upper extremity, incompletely imaged. Lungs/Pleura: Large complex left pleural effusion with extensive pleural enhancement which appears partially loculated. This is associated with extensive areas of passive atelectasis in the left lung, including near complete atelectasis of the left lower lobe. Some cavitary areas are noted in the inferior segment of the lingula. Right lung is clear. No right pleural effusion. No  definite suspicious appearing pulmonary nodules or masses are noted. Upper Abdomen: Spleen is incompletely imaged, but appears likely enlarged measuring approximally 13.6 x 5.6 cm on axial images. Musculoskeletal: There are no aggressive appearing lytic or blastic lesions noted in the visualized portions of the skeleton. Review of the MIP images confirms the above findings. IMPRESSION: 1. No evidence of pulmonary embolism. 2. Area of cavitation in the inferior segment of the lingula, likely secondary to necrotizing pneumonia. This is associated with a large left-sided empyema, and extensive passive atelectasis in the left lung, most notably in the left lower lobe. 3. Probable splenomegaly. Electronically Signed: By: Reuel Boom  Entrikin M.D. On: 06/02/2021 06:54   DG Chest Port 1 View  Result Date: 06/02/2021 CLINICAL DATA:  30 year old female with sepsis. Bilateral leg swelling. History of IV drug abuse. EXAM: PORTABLE CHEST 1 VIEW COMPARISON:  Chest x-ray 03/29/2018. FINDINGS: Opacity throughout the left hemithorax, favored to reflect a combination of atelectasis and/or consolidation in the left lung base, likely with large left pleural effusion, however, there are some irregular lucencies within the left mid to lower hemithorax. Right lung appears clear. No right pleural effusion. No pneumothorax. No evidence of pulmonary edema. Heart size is normal. Upper mediastinal contours are within normal limits. IMPRESSION: 1. Grossly abnormal chest x-ray with unusual features. Findings suggest a combination of airspace consolidation, atelectasis and complex pleural fluid collection in the left hemithorax. Correlation with forthcoming chest CTA is recommended to better characterize these findings. Electronically Signed   By: Trudie Reed M.D.   On: 06/02/2021 05:11    EKG: Independently reviewed. Sinus tachy  Assessment/Plan Principal Problem:   Sepsis (HCC) Active Problems:   IV drug abuse (HCC)   Substance  abuse (HCC)  (please populate well all problems here in Problem List. (For example, if patient is on BP meds at home and you resume or decide to hold them, it is a problem that needs to be her. Same for CAD, COPD, HLD and so on)  Sepsis -Evidenced by tachycardia, elevated white count, infection source we will need to rule out endocarditis, clearly has right septic arthritis on right ankle, left necrotizing pneumonia and empyema. -For necrotizing pneumonia and lymphedema, switch antibiotics to vancomycin and Zosyn, PCCM and infectious disease consulted.  Chest tube indicated -Left popliteal abscess right ankle septic arthritis, orthopedic surgery consulted, antibiotics as above.  DVT study of the right leg. -Continue IV fluid -TTE  Hypokalemia -P.o. replacement  Chronic microcytic anemia -Check iron level  Hyponatremia -Mild hypovolemic from sepsis, IVF, re-evaluate.  IVDU -As of now, there is no symptoms symptoms signs of narcotic withdrawal -As needed clonidine and hydroxyzine -PRN Xanax  Cigarette smoke -Nicotine patch   DVT prophylaxis: Lovenox Code Status: Full code Family Communication: Significant other at bedside Disposition Plan: Expect more than 1 week hospital stay to treat complicated sepsis underlying infections. Consults called: PCCM, orthopedic surgery, infectious disease. Admission status: PCU   Emeline General MD [facility] Pager [phone]  06/02/2021, 1:54 PM

## 2021-06-02 NOTE — ED Notes (Signed)
Patient transported to CT 

## 2021-06-02 NOTE — ED Notes (Signed)
Report given to Angie from [facility].

## 2021-06-03 ENCOUNTER — Inpatient Hospital Stay (HOSPITAL_COMMUNITY): Payer: Medicaid Other

## 2021-06-03 DIAGNOSIS — M7989 Other specified soft tissue disorders: Secondary | ICD-10-CM | POA: Diagnosis not present

## 2021-06-03 DIAGNOSIS — I38 Endocarditis, valve unspecified: Secondary | ICD-10-CM | POA: Diagnosis not present

## 2021-06-03 DIAGNOSIS — F1910 Other psychoactive substance abuse, uncomplicated: Secondary | ICD-10-CM

## 2021-06-03 DIAGNOSIS — A419 Sepsis, unspecified organism: Principal | ICD-10-CM

## 2021-06-03 DIAGNOSIS — J850 Gangrene and necrosis of lung: Secondary | ICD-10-CM

## 2021-06-03 DIAGNOSIS — J869 Pyothorax without fistula: Secondary | ICD-10-CM | POA: Diagnosis not present

## 2021-06-03 DIAGNOSIS — R7881 Bacteremia: Secondary | ICD-10-CM

## 2021-06-03 DIAGNOSIS — L0291 Cutaneous abscess, unspecified: Secondary | ICD-10-CM

## 2021-06-03 LAB — CBC
HCT: 23.5 % — ABNORMAL LOW (ref 36.0–46.0)
Hemoglobin: 7.1 g/dL — ABNORMAL LOW (ref 12.0–15.0)
MCH: 22.7 pg — ABNORMAL LOW (ref 26.0–34.0)
MCHC: 30.2 g/dL (ref 30.0–36.0)
MCV: 75.1 fL — ABNORMAL LOW (ref 80.0–100.0)
Platelets: 407 10*3/uL — ABNORMAL HIGH (ref 150–400)
RBC: 3.13 MIL/uL — ABNORMAL LOW (ref 3.87–5.11)
RDW: 19 % — ABNORMAL HIGH (ref 11.5–15.5)
WBC: 12.5 10*3/uL — ABNORMAL HIGH (ref 4.0–10.5)
nRBC: 0 % (ref 0.0–0.2)

## 2021-06-03 LAB — ECHOCARDIOGRAM COMPLETE
AR max vel: 2.32 cm2
AV Peak grad: 5.2 mmHg
Ao pk vel: 1.14 m/s
Area-P 1/2: 6.12 cm2
Calc EF: 58.4 %
Height: 63 in
S' Lateral: 3 cm
Single Plane A2C EF: 58.2 %
Single Plane A4C EF: 57.3 %
Weight: 3022.95 oz

## 2021-06-03 LAB — MAGNESIUM: Magnesium: 1.7 mg/dL (ref 1.7–2.4)

## 2021-06-03 LAB — BASIC METABOLIC PANEL
Anion gap: 6 (ref 5–15)
BUN: 8 mg/dL (ref 6–20)
CO2: 26 mmol/L (ref 22–32)
Calcium: 7.4 mg/dL — ABNORMAL LOW (ref 8.9–10.3)
Chloride: 101 mmol/L (ref 98–111)
Creatinine, Ser: 0.56 mg/dL (ref 0.44–1.00)
GFR, Estimated: >60 mL/min (ref >60)
Glucose, Bld: 102 mg/dL — ABNORMAL HIGH (ref 70–99)
Potassium: 4.2 mmol/L (ref 3.5–5.1)
Sodium: 133 mmol/L — ABNORMAL LOW (ref 135–145)

## 2021-06-03 LAB — HEMOGLOBIN AND HEMATOCRIT, BLOOD
HCT: 22 % — ABNORMAL LOW (ref 36.0–46.0)
Hemoglobin: 6.4 g/dL — CL (ref 12.0–15.0)

## 2021-06-03 LAB — URINE CULTURE

## 2021-06-03 LAB — RETICULOCYTES
Immature Retic Fract: 27.5 % — ABNORMAL HIGH (ref 2.3–15.9)
RBC.: 3.14 MIL/uL — ABNORMAL LOW (ref 3.87–5.11)
Retic Count, Absolute: 90.1 10*3/uL (ref 19.0–186.0)
Retic Ct Pct: 2.9 % (ref 0.4–3.1)

## 2021-06-03 LAB — HEPATITIS B SURFACE ANTIBODY,QUALITATIVE: Hep B S Ab: REACTIVE — AB

## 2021-06-03 LAB — PREPARE RBC (CROSSMATCH)

## 2021-06-03 LAB — IRON AND TIBC
Iron: 16 ug/dL — ABNORMAL LOW (ref 28–170)
Saturation Ratios: 8 % — ABNORMAL LOW (ref 10.4–31.8)
TIBC: 199 ug/dL — ABNORMAL LOW (ref 250–450)
UIBC: 183 ug/dL

## 2021-06-03 LAB — HEPATITIS B CORE ANTIBODY, TOTAL: Hep B Core Total Ab: NONREACTIVE

## 2021-06-03 LAB — HEPATITIS C ANTIBODY: HCV Ab: NONREACTIVE

## 2021-06-03 LAB — HEPATITIS B SURFACE ANTIGEN: Hepatitis B Surface Ag: NONREACTIVE

## 2021-06-03 LAB — FERRITIN: Ferritin: 108 ng/mL (ref 11–307)

## 2021-06-03 LAB — HIV ANTIBODY (ROUTINE TESTING W REFLEX): HIV Screen 4th Generation wRfx: NONREACTIVE

## 2021-06-03 LAB — CYTOLOGY - NON PAP

## 2021-06-03 IMAGING — MR MR [PERSON_NAME] LOW WO/W CM*R*
4 of 9 series · 13 of 40 positions shown · IV contrast (gadavist)
Comparison: Ankle x-ray [DATE]

CLINICAL DATA: Soft tissue infection suspected, lower leg, xray
done

EXAM:
MRI OF LOWER RIGHT EXTREMITY WITHOUT AND WITH CONTRAST
TECHNIQUE: Multiplanar, multisequence MR imaging of the right tibia and fibula
was performed both before and after administration of intravenous
contrast.
CONTRAST:  8mL GADAVIST GADOBUTROL 1 MMOL/ML IV SOLN

[Series 4: T1 · coronal · 4.0mm · 0.43mm/px · 2 of 30 slices shown (1 of 2)]
[im 1/30]
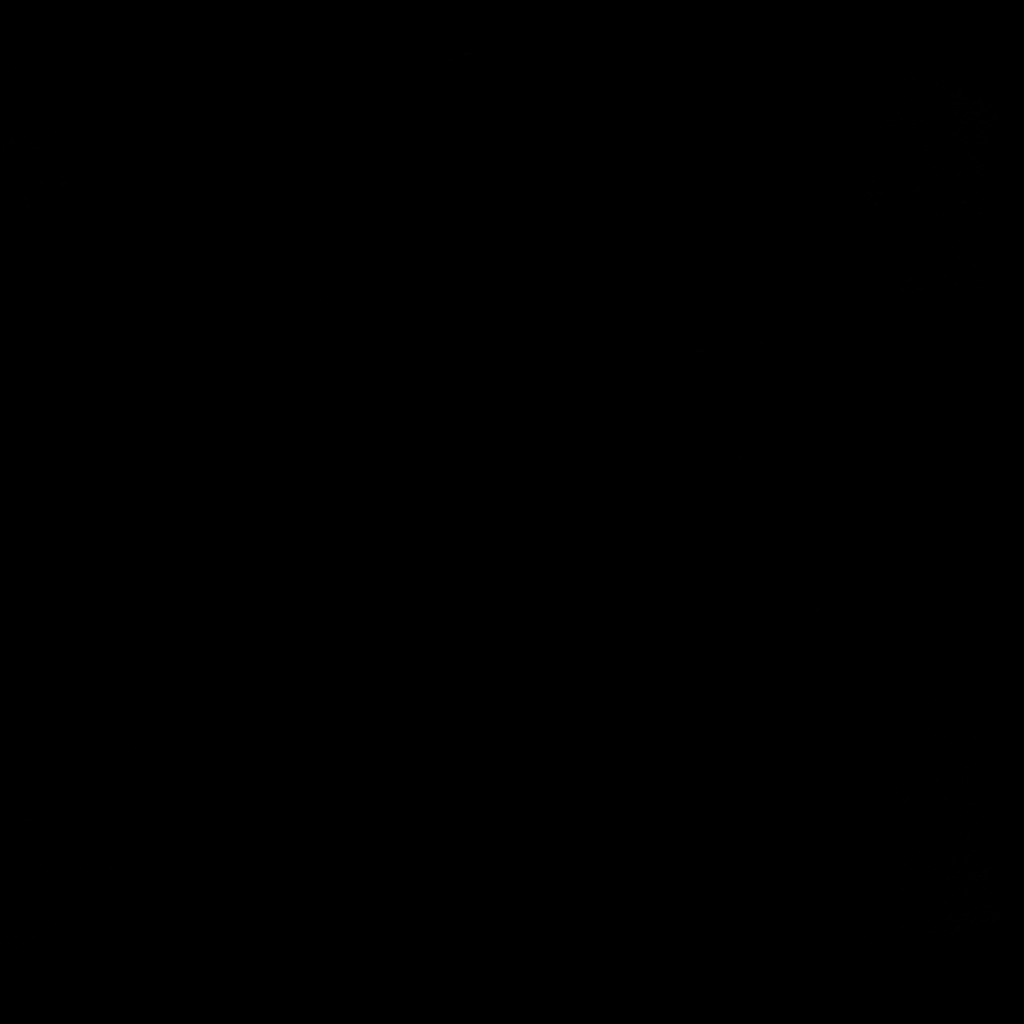
[im 30/30]
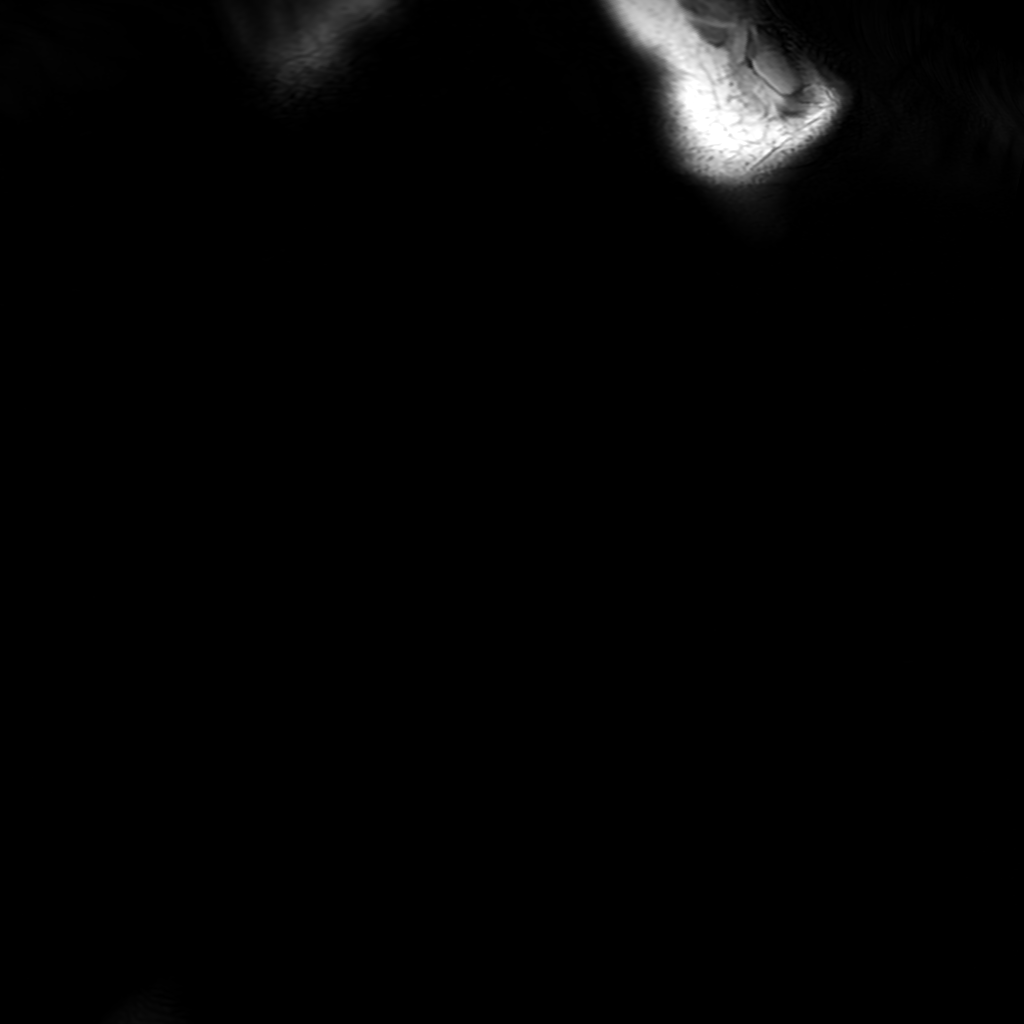

[Series 6: T1 · axial · 4.0mm · 0.51mm/px · z∈[-35,+244]mm · 3 of 85 slices shown (2 of 2)]
[im 15/85]
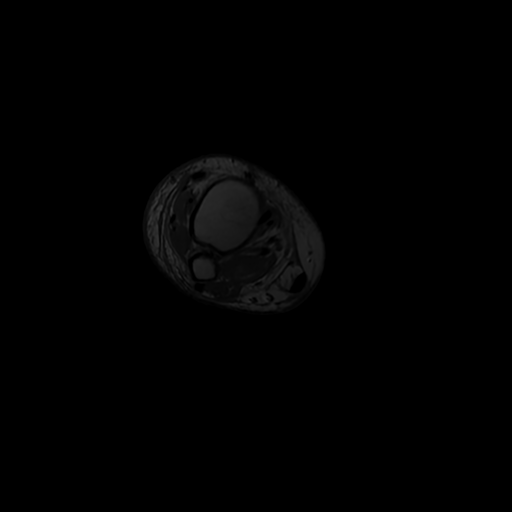
[im 43/85]
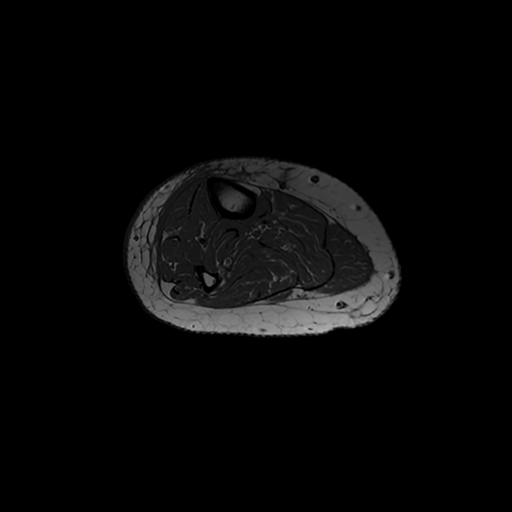
[im 71/85]
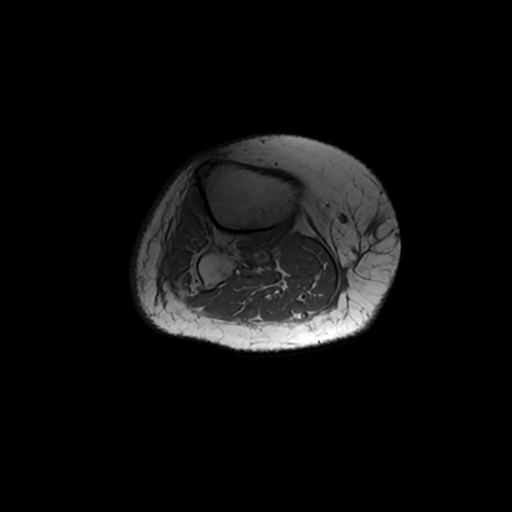

[Series 7: T2 fat-sat · axial · 4.0mm · 0.51mm/px · z∈[-105,+244]mm · 5 of 85 slices shown]
[im 1/85]
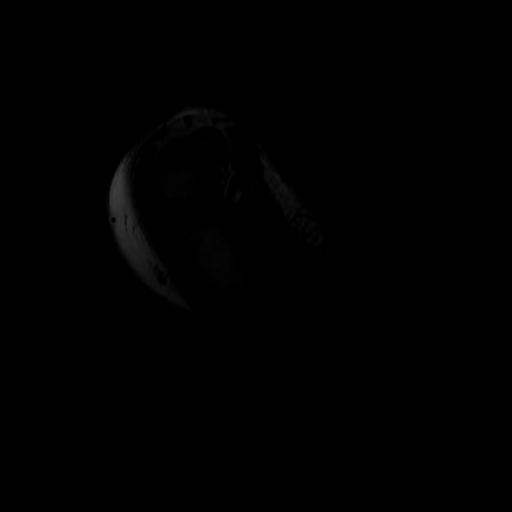
[im 15/85]
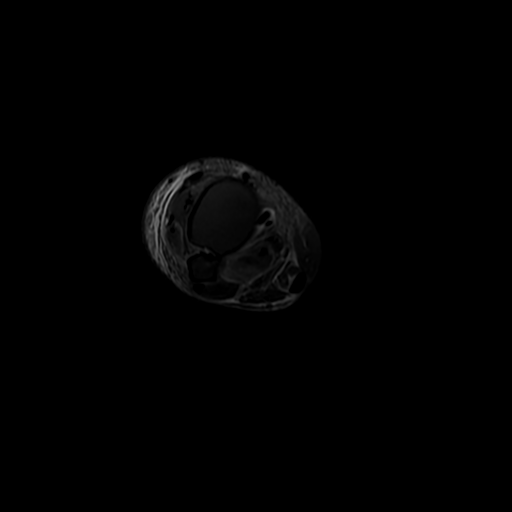
[im 29/85]
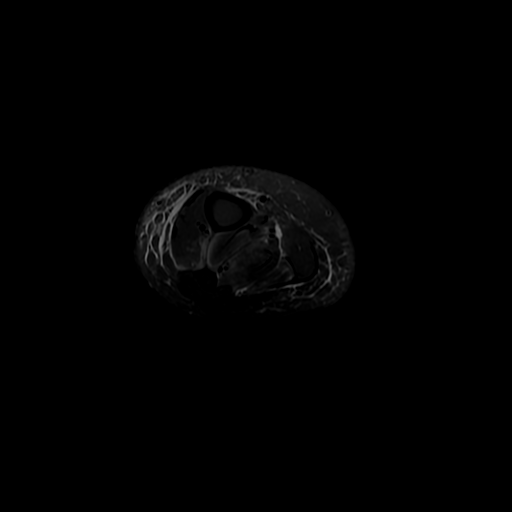
[im 43/85]
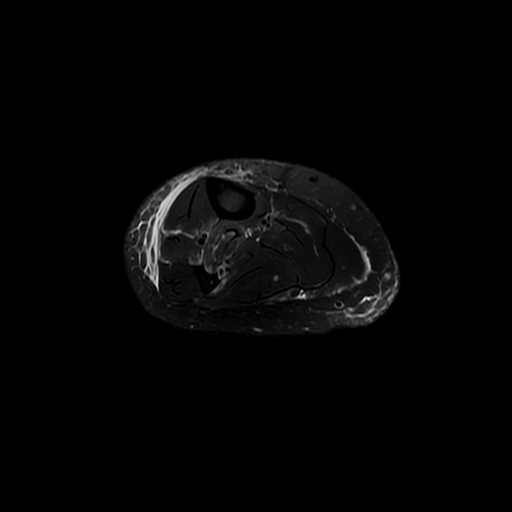
[im 71/85]
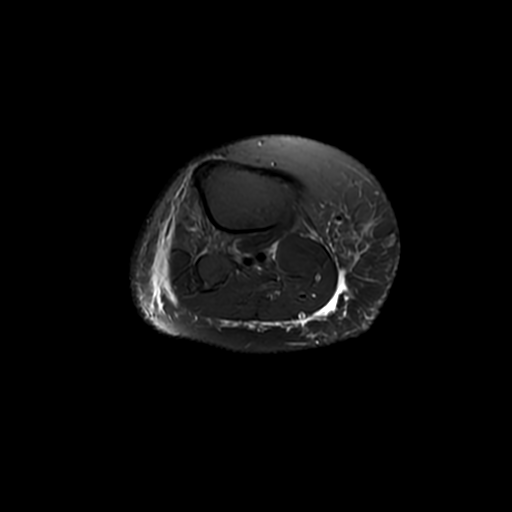

[Series 9: T1 fat-sat · axial · non-contrast · 4.0mm · 0.51mm/px · z∈[-35,+244]mm · 3 of 85 slices shown]
[im 15/85]
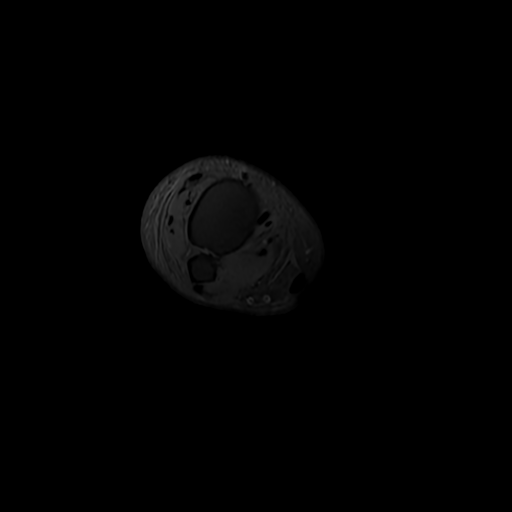
[im 43/85]
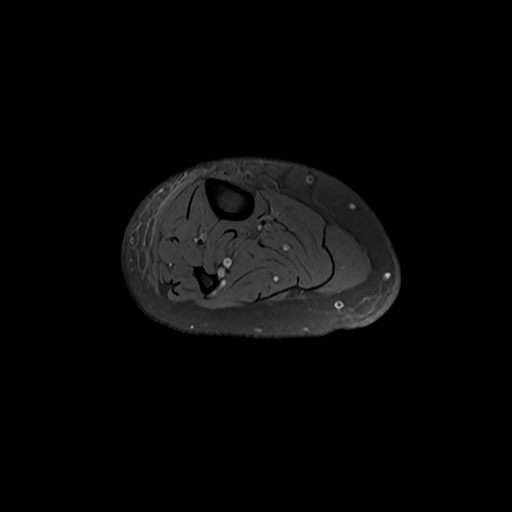
[im 71/85]
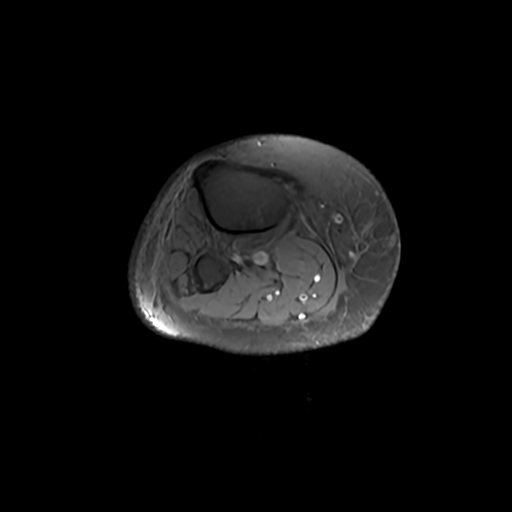

[13 of 40 positions shown; findings below may reference images not displayed]

FINDINGS: Bones/Joint/Cartilage

No acute fracture. No malalignment. No bone marrow edema. No
cortical destruction. No marrow replacing bone lesion. Trace
tibiotalar joint effusion.

Ligaments

Grossly intact.

Muscles and Tendons

Small volume tenosynovial fluid associated with the posteromedial
ankle tendons. Tendinous structures appear otherwise within normal
limits. The mild diffuse intramuscular edema involving the anterior
and deep posterior compartments of the lower leg with associated
perifascial edema. Edema is most pronounced within the tibialis
posterior and flexor hallucis longus muscles. Edema tracks along the
tibiofibular syndesmosis. No organized intramuscular fluid
collection.

Soft tissues

Rim enhancing fluid collection within the medial subcutaneous soft
tissues at the level of the tibial plateau. Collection measures
x 2.0 x 2.9 cm (series 7, image 11). Subcutaneous edema extends
along the length of the lower leg, most pronounced anterolaterally
with fluid overlying the superficial investing fascia of the
anterior compartment.
IMPRESSION: 1. Rim-enhancing abscess within the medial subcutaneous soft tissues
at the level of the tibial plateau measuring 3.6 x 2.0 x 2.9 cm.
2. Mild diffuse intramuscular edema involving the anterior and deep
posterior compartments of the lower leg with associated perifascial
edema, compatible with myofasciitis. No evidence of myonecrosis or
pyomyositis.
3. Mild tenosynovitis associated with the posteromedial ankle
tendons, which may be reactive or infectious.
4. No evidence of osteomyelitis.

## 2021-06-03 MED ORDER — LACTATED RINGERS IV SOLN: INTRAVENOUS | Status: AC

## 2021-06-03 MED ORDER — SODIUM CHLORIDE (PF) 0.9 % IJ SOLN
10.0000 mg | Freq: Once | INTRAMUSCULAR | Status: AC
  Administered 2021-06-03: 10 mg via INTRAPLEURAL
  Filled 2021-06-03: qty 10

## 2021-06-03 MED ORDER — SODIUM CHLORIDE 0.9% IV SOLUTION: Freq: Once | INTRAVENOUS | Status: AC

## 2021-06-03 MED ORDER — STERILE WATER FOR INJECTION IJ SOLN
5.0000 mg | Freq: Once | RESPIRATORY_TRACT | Status: AC
  Administered 2021-06-03: 5 mg via INTRAPLEURAL
  Filled 2021-06-03: qty 5

## 2021-06-03 MED ORDER — GADOBUTROL 1 MMOL/ML IV SOLN
8.0000 mL | Freq: Once | INTRAVENOUS | Status: AC | PRN
  Administered 2021-06-03: 8 mL via INTRAVENOUS

## 2021-06-03 NOTE — Consult Note (Signed)
[facility]    Date of Admission:  06/02/2021     Reason for Consult: right ankle infection, empyema     Referring Physician: Dr Jarvis Newcomer  Current antibiotics: Vancomycin 12/8--present Cefepime 12/8--present Flagyl 12/8--present  Previous antibiotics: N/A  ASSESSMENT:    30 y.o. female admitted with:  Right lower leg infection: With concern for a deep infection of the bone or joint. Left leg abscess:  This is spontaneously draining. Left lobe empyema and cavitary pneumonia: Status post chest tube placement 06/02/21. Concern for bacteremia in patient with active injection drug use. Right axillary stranding: Incidentally noted on CT chest. Anxiety, IVDU Sepsis due to #1-3  RECOMMENDATIONS:    Continue vancomycin and cefepime per pharmacy Stop metronidazole for now Repeat blood cultures as only 1 set done in the ED MRI right leg to evaluate for deeper infection Plan for MRI right UE given CT abnormalities noted but Sharon [name] has no complaints with this area.  Would prioritize leg imaging Echo Chest tube management per CCM.  Follow up pleural fluid cultures Wound care for left leg abscess Check hepatitis serologies, RPR, urine GC/CT Will follow   Principal Problem:   Sepsis (HCC) Active Problems:   IV drug abuse (HCC)   Substance abuse (HCC)   Empyema lung (HCC)   MEDICATIONS:    Scheduled Meds:  acidophilus  2 capsule Oral Daily   alteplase (TPA) for intrapleural administration  10 mg Intrapleural Once   And   pulmozyme (DORNASE) for intrapleural administration  5 mg Intrapleural Once   enoxaparin (LOVENOX) injection  40 mg Subcutaneous Q24H   guaiFENesin  1,200 mg Oral BID   nicotine  21 mg Transdermal Daily   sodium chloride flush  10 mL Other Q8H   Continuous Infusions:  ceFEPime (MAXIPIME) IV 2 g (06/03/21 0601)   lactated ringers 150 mL/hr at 06/03/21 1011   metronidazole 500 mg (06/03/21 0336)   vancomycin 1,000 mg (06/03/21  1012)   PRN Meds:.acetaminophen **OR** acetaminophen, ALPRAZolam, cloNIDine, hydrOXYzine, ipratropium-albuterol, ketorolac  HPI:    Sharon [name] is a 30 y.o. female with a history of active injecting drug use, anxiety, tobacco use who presented yesterday to med [facility] with several complaints including right ankle pain, redness, swelling.  Sharon [name] also reported subjective fevers and cough.  Sharon [name] has been actively injecting heroin and reports that her last use was approximately 1 week ago.  Sharon [name] reports no prior history of infectious complications related to her injecting drugs and has no history of endocarditis.  On exam in the ER Sharon [name] was found to have a left calf abscess that was spontaneously draining with surrounding cellulitis.  Sharon [name] was also noted to have right calf tenderness to palpation and soft tissue swelling with induration.  Her right foot/ankle also had erythema, edema, and decreased range of motion.  Sharon [name] was started on broad-spectrum antibiotics.  Sharon [name] was found to have leukocytosis of 20.5 and was afebrile.  Other lab work obtained included creatinine 0.69, potassium 3.2, albumin 2.0, normal LFTs, normal lactic acid.  Her urinalysis was notable for positive nitrites, pyuria, and many bacteria.  Urine culture is pending, however, Sharon [name] is asymptomatic from a urinary standpoint.  Her HIV testing was nonreactive.  Sharon [name] underwent imaging of the chest with x-ray that was abnormal and thus prompted a CTA of the chest.  This was significant for area of cavitation in the inferior segment of the lingula, likely secondary to necrotizing pneumonia and associated with  a large left-sided empyema.  Also noted on CT chest was soft tissue stranding in the subcutaneous fat of the right axillary region with what appears to be a small amount of gas in the soft tissues.  Sharon [name] denies any right upper extremity symptoms or signs of infection and a MRI of this area is pending.  Given her chest imaging findings,  Sharon [name] was evaluated by pulmonary/critical care and underwent chest tube placement.  This chest tube has put out 700 cc thus far.  Follow-up chest x-ray after chest tube placement has indicated decreased size of her pleural effusion.  Cultures from the pleural fluid are pending.  Aspiration of the right ankle was attempted in the ED and unsuccessful.  Sharon [name] was seen by orthopedics this morning who recommended continuing to treat for cellulitis given report of improvement over the last 24 hrs.  Sharon [name] had only 1 set of blood cultures obtained in the ED.    Sharon [name] reports that Sharon [name] was previously on methadone and did fairly well on this prior to falling back into injecting drugs about 1 year ago.  Sharon [name] also has previously been on Suboxone but does not think it was as effective for her and Sharon [name] thinks that Sharon [name] is allergic to naloxone.        Past Medical History:  Diagnosis Date   Anxiety    no meds   Arthritis    right lower arm tendonitis   Carpal tunnel syndrome    Headache(784.0)    otc med prn   IV drug abuse ([place])    Substance abuse ([place])     Social History   Tobacco Use   Smoking status: Every Day    Packs/day: 1.00    Years: 4.00    Pack years: 4.00    Types: Cigarettes   Smokeless tobacco: Former  Scientific laboratory technician Use: Former  Substance Use Topics   Alcohol use: No    Comment: ocassionaly    Drug use: Yes    Types: Marijuana, Other-see comments, Oxycodone, IV    Comment: hx - weekly vicodin use, benzo abuse    Family History  Problem Relation Age of Onset   Seizures Father    Diabetes Other    CAD Other    Hypertension Other    Anesthesia problems Neg Hx    Hypotension Neg Hx    Malignant hyperthermia Neg Hx    Pseudochol deficiency Neg Hx     No Known Allergies  Review of Systems  Constitutional:  Positive for chills and fever.  Respiratory:  Positive for cough.   Cardiovascular:  Positive for chest pain.  Gastrointestinal: Negative.   Genitourinary:  Negative.   Musculoskeletal:  Positive for joint pain.  Skin:        + draining abscess.  Psychiatric/Behavioral:  Positive for substance abuse.    OBJECTIVE:   Blood pressure (!) 98/56, pulse 94, temperature 98.2 F (36.8 C), temperature source Oral, resp. rate 16, height 5\' 3"  (1.6 m), weight 85.7 kg, SpO2 99 %. Body mass index is 33.47 kg/m.  Physical Exam Constitutional:      General: Sharon [name] is not in acute distress.    Appearance: Normal appearance.  HENT:     Head: Normocephalic and atraumatic.  Cardiovascular:     Rate and Rhythm: Normal rate and regular rhythm.  Pulmonary:     Effort: Pulmonary effort is normal. No respiratory distress.     Comments: Breath sounds diminished at bases with coarseness  on the left.  Chest tube in place on left.  Abdominal:     General: There is no distension.     Palpations: Abdomen is soft.     Tenderness: There is no abdominal tenderness.  Musculoskeletal:        General: Swelling and tenderness present.     Comments: Sharon [name] has a draining abscess on left leg that is wrapped. Right leg and ankle has swelling and some erythema.  Sharon [name] is able to move her toes but resists any ROM due to pain.  Warm and well perfused. Right arm with no warmth, TTP, induration. Sites of previous injecting drugs noted.   Skin:    General: Skin is warm and dry.     Findings: Erythema present. No rash.  Neurological:     General: No focal deficit present.     Mental Status: Sharon [name] is alert and oriented to person, place, and time.  Psychiatric:        Mood and Affect: Mood normal.        Behavior: Behavior normal.     Lab Results: Lab Results  Component Value Date   WBC 12.5 (H) 06/03/2021   HGB 7.1 (L) 06/03/2021   HCT 23.5 (L) 06/03/2021   MCV 75.1 (L) 06/03/2021   PLT 407 (H) 06/03/2021    Lab Results  Component Value Date   NA 133 (L) 06/03/2021   K 4.2 06/03/2021   CO2 26 06/03/2021   GLUCOSE 102 (H) 06/03/2021   BUN 8 06/03/2021   CREATININE  0.56 06/03/2021   CALCIUM 7.4 (L) 06/03/2021   GFRNONAA >60 06/03/2021   GFRAA >60 04/03/2019    Lab Results  Component Value Date   ALT 21 06/02/2021   AST 29 06/02/2021   ALKPHOS 106 06/02/2021   BILITOT 1.8 (H) 06/02/2021    No results found for: CRP  No results found for: ESRSEDRATE  I have reviewed the micro and lab results in Epic.  Imaging: DG Ankle Complete Right  Result Date: 06/02/2021 CLINICAL DATA:  30 year old female with sepsis. Bilateral leg swelling. EXAM: RIGHT ANKLE - COMPLETE 3+ VIEW COMPARISON:  No priors. FINDINGS: There is no evidence of fracture, dislocation, or joint effusion. There is no evidence of arthropathy or other focal bone abnormality. Soft tissues are diffusely swollen. IMPRESSION: 1. Diffuse swelling of the soft tissues around the right ankle joint. No acute osseous abnormality. Electronically Signed   By: Vinnie Langton M.D.   On: 06/02/2021 05:12   CT Angio Chest PE W/Cm &/Or Wo Cm  Addendum Date: 06/02/2021   ADDENDUM REPORT: 06/02/2021 09:08 ADDENDUM: As mentioned in the body of the report there is extensive soft tissue stranding in the subcutaneous fat of the right axillary region where there also appears to be a small amount of gas in the soft tissues. Clinical correlation for signs and symptoms of acute soft tissue infection in the right upper extremity is recommended. Electronically Signed   By: Vinnie Langton M.D.   On: 06/02/2021 09:08   Result Date: 06/02/2021 CLINICAL DATA:  30 year old female with history of IV drug abuse. Possible septic emboli. Possible infectious endocarditis. EXAM: CT ANGIOGRAPHY CHEST WITH CONTRAST TECHNIQUE: Multidetector CT imaging of the chest was performed using the standard protocol during bolus administration of intravenous contrast. Multiplanar CT image reconstructions and MIPs were obtained to evaluate the vascular anatomy. CONTRAST:  19mL OMNIPAQUE IOHEXOL 350 MG/ML SOLN COMPARISON:  No priors. FINDINGS:  Cardiovascular: There are no filling defects within the  pulmonary arterial tree to suggest pulmonary embolism. Heart size is normal. There is no significant pericardial fluid, thickening or pericardial calcification. No atherosclerotic calcifications are noted in the thoracic aorta or the coronary arteries. Mediastinum/Nodes: Prominent but nonenlarged mediastinal and left hilar lymph nodes are nonspecific, but likely reactive. Esophagus is unremarkable in appearance. No axillary lymphadenopathy. However, there is extensive soft tissue stranding and what appears to be some fluid in the right axillary region. A small amount of gas is also noted in the soft tissues of the proximal right upper extremity, incompletely imaged. Lungs/Pleura: Large complex left pleural effusion with extensive pleural enhancement which appears partially loculated. This is associated with extensive areas of passive atelectasis in the left lung, including near complete atelectasis of the left lower lobe. Some cavitary areas are noted in the inferior segment of the lingula. Right lung is clear. No right pleural effusion. No definite suspicious appearing pulmonary nodules or masses are noted. Upper Abdomen: Spleen is incompletely imaged, but appears likely enlarged measuring approximally 13.6 x 5.6 cm on axial images. Musculoskeletal: There are no aggressive appearing lytic or blastic lesions noted in the visualized portions of the skeleton. Review of the MIP images confirms the above findings. IMPRESSION: 1. No evidence of pulmonary embolism. 2. Area of cavitation in the inferior segment of the lingula, likely secondary to necrotizing pneumonia. This is associated with a large left-sided empyema, and extensive passive atelectasis in the left lung, most notably in the left lower lobe. 3. Probable splenomegaly. Electronically Signed: By: Trudie Reed M.D. On: 06/02/2021 06:54   DG Chest Port 1 View  Result Date: 06/02/2021 CLINICAL DATA:   Status post chest tube placement. EXAM: PORTABLE CHEST 1 VIEW COMPARISON:  Same day chest radiograph at 0415 hours and same day chest CT. FINDINGS: Interval placement of a left basilar pigtail thoracostomy tube. Decreased size of the left-sided empyema with increased expansion of the left lung. Left basilar airspace consolidation. The heart size and mediastinal contours are unchanged. The visualized skeletal structures are unchanged. IMPRESSION: Interval placement of left basilar pigtail thoracostomy tube with decreased size of the left-sided empyema. Electronically Signed   By: Maudry Mayhew M.D.   On: 06/02/2021 15:54   DG Chest Port 1 View  Result Date: 06/02/2021 CLINICAL DATA:  30 year old female with sepsis. Bilateral leg swelling. History of IV drug abuse. EXAM: PORTABLE CHEST 1 VIEW COMPARISON:  Chest x-ray 03/29/2018. FINDINGS: Opacity throughout the left hemithorax, favored to reflect a combination of atelectasis and/or consolidation in the left lung base, likely with large left pleural effusion, however, there are some irregular lucencies within the left mid to lower hemithorax. Right lung appears clear. No right pleural effusion. No pneumothorax. No evidence of pulmonary edema. Heart size is normal. Upper mediastinal contours are within normal limits. IMPRESSION: 1. Grossly abnormal chest x-ray with unusual features. Findings suggest a combination of airspace consolidation, atelectasis and complex pleural fluid collection in the left hemithorax. Correlation with forthcoming chest CTA is recommended to better characterize these findings. Electronically Signed   By: Trudie Reed M.D.   On: 06/02/2021 05:11     Imaging independently reviewed in Epic.  Vedia Coffer for Infectious Disease [facility] [phone] pager 06/03/2021, 10:52 AM

## 2021-06-03 NOTE — Progress Notes (Signed)
Lower extremity venous RT study completed.   Please see CV Proc for preliminary results.   Neisha Hinger, RDMS, RVT  

## 2021-06-03 NOTE — Progress Notes (Signed)
Received a critical lab for Hgb 6.4. Paged the provider. Will transfuse 1 unit when ready from blood bank.  [place] N Doniel Maiello

## 2021-06-03 NOTE — Consult Note (Signed)
NAME:  Sharon [name], MRN:  [id number], DOB:  07-May-1991, LOS: 1 ADMISSION DATE:  06/02/2021, CONSULTATION DATE:  06/02/2021 REFERRING MD:  Dr. 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  Found to have abscess on Lt leg.  She had chest xray that showed large left pleural effusion.  CT chest showed area of cavitation in the inferior lingula and large Lt sided pleural effusion.  PCCM asked to assess for drainage of pleural effusion.  Pertinent  Medical History  Anxiety, OA, Carpal tunnel, IVDA, Headache  Significant Hospital Events: Including procedures, antibiotic start and stop dates in addition to other pertinent events   12/08 Admit, ortho consulted, start ABx, Lt pig tail catheter placed, start tPA/dornase  Studies:  CT angio chest 06/02/21 >> large complex Lt effusion with pleural enhancement with partial loculation, ATX LLL, cavitary area lingula, splenomegaly Lt pleural fluid 06/02/21 >> glucose less than 20, protein 5.4, LDH 4696, 10625 cells (92% neutrophils)  Interim History / Subjective:  Breathing better.  Still has pain in her legs, but better.  Feels hungry.  Objective   Blood pressure (!) 95/58, pulse 91, temperature 98 F (36.7 C), temperature source Oral, resp. rate 18, height 5\' 3"  (1.6 m), weight 85.7 kg, SpO2 100 %.        Intake/Output Summary (Last 24 hours) at 06/03/2021 0756 Last data filed at 06/03/2021 0555 Gross per 24 hour  Intake 1611.17 ml  Output 900 ml  Net 711.17 ml   Filed Weights   06/02/21 0036 06/02/21 0456 06/02/21 1331  Weight: 89.8 kg 86.4 kg 85.7 kg    Examination:  General - alert Eyes - pupils reactive ENT - no sinus tenderness, no stridor Cardiac - regular rate/rhythm, no murmur Chest - decreased BS Lt base, Lt pig tail in place Abdomen - soft, non tender, + bowel sounds Extremities - no cyanosis, clubbing,  or edema Skin - abscess Lt calf, decreased erythema Rt ankle Neuro - normal strength, moves extremities, follows commands Psych - normal mood and behavior  Resolved Hospital Problem list     Assessment & Plan:   Lt pleural space empyema with cavitary pneumonia in setting of IVDA and Leg abscess and cellulitis. - pig tail placed 12/08  - continue tPA/dornase - day 2 of Abx currently on vancomycin, flagyl, cefepime - f/u pleural fluid and blood cultures from 06/02/21 - f/u Echo  Anxiety, hx of IVDA. - per primary team  Leg abscess, Rt lower leg cellulitis, Rt axillary stranding. - ortho consulted; deferring intervention at this time since she has some clinical improvement with Abx - continue ABx - f/u MRI Rt arm  Microcytic anemia. - f/u CBC - transfuse for Hb < 7  D/w Dr. Tamera Punt.  Updated pt's boyfriend at bedside.  Best Practice (right click and "Reselect all SmartList Selections" daily)   Diet/type: Regular consistency (see orders) DVT prophylaxis: LMWH GI prophylaxis: N/A Lines: N/A Foley:  N/A Code Status:  full code Last date of multidisciplinary goals of care discussion [x]   Labs    CMP Latest Ref Rng & Units 06/03/2021 06/02/2021 04/03/2019  Glucose 70 - 99 mg/dL 102(H) 108(H) 129(H)  BUN 6 - 20 mg/dL 8 10 9   Creatinine 0.44 - 1.00 mg/dL 0.56 0.69 0.76  Sodium 135 - 145 mmol/L 133(L) 131(L) 137  Potassium 3.5 - 5.1 mmol/L 4.2 3.2(L) 3.9  Chloride 98 - 111 mmol/L 101 95(L) 103  CO2 22 - 32 mmol/L 26 26 25   Calcium 8.9 - 10.3 mg/dL 7.4(L) 7.8(L) 7.9(L)  Total Protein 6.5 - 8.1 g/dL - 8.4(H) 4.7(L)  Total Bilirubin 0.3 - 1.2 mg/dL - 1.8(H) 0.5  Alkaline Phos 38 - 126 U/L - 106 115  AST 15 - 41 U/L - 29 20  ALT 0 - 44 U/L - 21 9    CBC Latest Ref Rng & Units 06/03/2021 06/02/2021 04/03/2019  WBC 4.0 - 10.5 K/uL 12.5(H) 20.5(H) 31.2(H)  Hemoglobin 12.0 - 15.0 g/dL 7.1(L) 8.7(L) 9.7(L)  Hematocrit 36.0 - 46.0 % 23.5(L) 28.8(L) 31.3(L)  Platelets 150 - 400  K/uL 407(H) 489(H) 305    Signature:  06/03/2019, MD [facility]/Critical Care Pager - [phone] 06/03/2021, 7:56 AM

## 2021-06-03 NOTE — Procedures (Signed)
Pleural Fibrinolytic Administration Procedure Note  Sharon [name]  [id number]  29-Jan-1991  Date:06/03/21  Time:7:22 AM   Provider Performing:Pete E Tanja Port   Procedure: Pleural Fibrinolysis Initial day (44315)  Indication(s) Fibrinolysis of complicated pleural effusion  Consent Risks of the procedure as well as the alternatives and risks of each were explained to the patient and/or caregiver.  Consent for the procedure was obtained.   Anesthesia None   Time Out Verified patient identification, verified procedure, site/side was marked, verified correct patient position, special equipment/implants available, medications/allergies/relevant history reviewed, required imaging and test results available.   Sterile Technique Hand hygiene, gloves   Procedure Description Existing pleural catheter was cleaned and accessed in sterile manner.  10mg  of tPA in 30cc of saline and 5mg  of dornase in 30cc of sterile water were injected into pleural space using existing pleural catheter.  Catheter will be clamped for 1 hour and then placed back to suction.   Complications/Tolerance None; patient tolerated the procedure well.   EBL None   Specimen(s) None ACNP-BC [facility] Pulmonary/Critical Care Pager # [phone] OR # [phone] if no answer

## 2021-06-03 NOTE — Consult Note (Signed)
Reason for Consult:rule out right septic ankle Referring Physician: [name] is an 30 y.o. female.  HPI: 30 year old female with history of IV DA and one week history of right ankle pain and swelling which worsened 2 days ago prompting a visit to the emergency department.  She was seen in [place] and subsequently transferred to [facility].  The joint was attempted to be aspirated in the emergency department yesterday.  The practitioner felt confident that the needle was in the joint but this was a dry tap.  She subsequently has been on IV antibiotics over the last 24 hours and has seen significant improvement in symptoms and the white count is now down to half of what it was yesterday.  I spoke with the critical care attending this morning who notes that the ankle looks significantly better than it did yesterday. She also has necrotizing pneumonia with chest tube placed yesterday.  There is an abscess on the left lower extremity which has opened and is draining.  Past Medical History:  Diagnosis Date   Anxiety    no meds   Arthritis    right lower arm tendonitis   Carpal tunnel syndrome    Headache(784.0)    otc med prn   IV drug abuse (HCC)    Substance abuse (HCC)     Past Surgical History:  Procedure Laterality Date   CESAREAN SECTION  05/16/2012   Procedure: CESAREAN SECTION;  Surgeon: Brock Bad, MD;  Location: [facility] ORS;  Service: Obstetrics;  Laterality: N/A;  Primary   CESAREAN SECTION N/A 04/02/2019   Procedure: CESAREAN SECTION;  Surgeon: Lazaro Arms, MD;  Location: [facility] LD ORS;  Service: Obstetrics;  Laterality: N/A;   FOOT SURGERY  1997   removal foreign body (sewing needle)   TOOTH EXTRACTION  2008   upper incisor x2    Family History  Problem Relation Age of Onset   Seizures Father    Diabetes Other    CAD Other    Hypertension Other    Anesthesia problems Neg Hx    Hypotension Neg Hx    Malignant hyperthermia Neg Hx    Pseudochol deficiency  Neg Hx     Social History:  reports that she has been smoking cigarettes. She has a 4.00 pack-year smoking history. She has quit using smokeless tobacco. She reports current drug use. Drugs: Marijuana, Other-see comments, Oxycodone, and IV. She reports that she does not drink alcohol.  Allergies: No Known Allergies  Medications: I have reviewed the patient's current medications.  Results for orders placed or performed during the hospital encounter of 06/02/21 (from the past 48 hour(s))  CBC with Differential     Status: Abnormal   Collection Time: 06/02/21  2:21 AM  Result Value Ref Range   WBC 20.5 (H) 4.0 - 10.5 K/uL   RBC 3.88 3.87 - 5.11 MIL/uL   Hemoglobin 8.7 (L) 12.0 - 15.0 g/dL    Comment: Reticulocyte Hemoglobin testing may be clinically indicated, consider ordering this additional test [id number]    HCT 28.8 (L) 36.0 - 46.0 %   MCV 74.2 (L) 80.0 - 100.0 fL   MCH 22.4 (L) 26.0 - 34.0 pg   MCHC 30.2 30.0 - 36.0 g/dL   RDW 54.0 (H) 98.1 - 19.1 %   Platelets 489 (H) 150 - 400 K/uL   nRBC 0.0 0.0 - 0.2 %   Neutrophils Relative % 76 %   Neutro Abs 15.4 (H) 1.7 - 7.7  K/uL   Lymphocytes Relative 19 %   Lymphs Abs 3.9 0.7 - 4.0 K/uL   Monocytes Relative 4 %   Monocytes Absolute 0.7 0.1 - 1.0 K/uL   Eosinophils Relative 0 %   Eosinophils Absolute 0.1 0.0 - 0.5 K/uL   Basophils Relative 0 %   Basophils Absolute 0.1 0.0 - 0.1 K/uL   Immature Granulocytes 1 %   Abs Immature Granulocytes 0.25 (H) 0.00 - 0.07 K/uL    Comment: Performed at [facility], [address] [place], [place] [address]  Comprehensive metabolic panel     Status: Abnormal   Collection Time: 06/02/21  2:21 AM  Result Value Ref Range   Sodium 131 (L) 135 - 145 mmol/L   Potassium 3.2 (L) 3.5 - 5.1 mmol/L   Chloride 95 (L) 98 - 111 mmol/L   CO2 26 22 - 32 mmol/L   Glucose, Bld 108 (H) 70 - 99 mg/dL    Comment: Glucose reference range applies only to samples taken after fasting for at least 8  hours.   BUN 10 6 - 20 mg/dL   Creatinine, Ser 6.04 0.44 - 1.00 mg/dL   Calcium 7.8 (L) 8.9 - 10.3 mg/dL   Total Protein 8.4 (H) 6.5 - 8.1 g/dL   Albumin 2.0 (L) 3.5 - 5.0 g/dL   AST 29 15 - 41 U/L   ALT 21 0 - 44 U/L   Alkaline Phosphatase 106 38 - 126 U/L   Total Bilirubin 1.8 (H) 0.3 - 1.2 mg/dL   GFR, Estimated >54 >09 mL/min    Comment: (NOTE) Calculated using the CKD-EPI Creatinine Equation (2021)    Anion gap 10 5 - 15    Comment: Performed at [facility], [address] [place], [place] [address]  Resp Panel by RT-PCR (Flu A&B, Covid) Nasopharyngeal Swab     Status: None   Collection Time: 06/02/21  4:50 AM   Specimen: Nasopharyngeal Swab; Nasopharyngeal(NP) swabs in vial transport medium  Result Value Ref Range   SARS Coronavirus 2 by RT PCR NEGATIVE NEGATIVE    Comment: (NOTE) SARS-CoV-2 target nucleic acids are NOT DETECTED.  The SARS-CoV-2 RNA is generally detectable in upper respiratory specimens during the acute phase of infection. The lowest concentration of SARS-CoV-2 viral copies this assay can detect is 138 copies/mL. A negative result does not preclude SARS-Cov-2 infection and should not be used as the sole basis for treatment or other patient management decisions. A negative result may occur with  improper specimen collection/handling, submission of specimen other than nasopharyngeal swab, presence of viral mutation(s) within the areas targeted by this assay, and inadequate number of viral copies(<138 copies/mL). A negative result must be combined with clinical observations, patient history, and epidemiological information. The expected result is Negative.  Fact Sheet for Patients:  BloggerCourse.com  Fact Sheet for Healthcare Providers:  SeriousBroker.it  This test is no t yet approved or cleared by the [place] FDA and  has been authorized for detection and/or diagnosis of SARS-CoV-2  by FDA under an Emergency Use Authorization (EUA). This EUA will remain  in effect (meaning this test can be used) for the duration of the COVID-19 declaration under Section 564(b)(1) of the Act, 21 U.S.C.section 360bbb-3(b)(1), unless the authorization is terminated  or revoked sooner.       Influenza A by PCR NEGATIVE NEGATIVE   Influenza B by PCR NEGATIVE NEGATIVE    Comment: (NOTE) The Xpert Xpress SARS-CoV-2/FLU/RSV plus assay  is intended as an aid in the diagnosis of influenza from Nasopharyngeal swab specimens and should not be used as a sole basis for treatment. Nasal washings and aspirates are unacceptable for Xpert Xpress SARS-CoV-2/FLU/RSV testing.  Fact Sheet for Patients: BloggerCourse.com  Fact Sheet for Healthcare Providers: SeriousBroker.it  This test is not yet approved or cleared by the [place] FDA and has been authorized for detection and/or diagnosis of SARS-CoV-2 by FDA under an Emergency Use Authorization (EUA). This EUA will remain in effect (meaning this test can be used) for the duration of the COVID-19 declaration under Section 564(b)(1) of the Act, 21 U.S.C. section 360bbb-3(b)(1), unless the authorization is terminated or revoked.  Performed at [facility], [address] [place], [place] [address]   Lactic acid, plasma     Status: None   Collection Time: 06/02/21  4:50 AM  Result Value Ref Range   Lactic Acid, Venous 1.7 0.5 - 1.9 mmol/L    Comment: Performed at [facility], [address] [place], [place] [address]  Protime-INR     Status: Abnormal   Collection Time: 06/02/21  4:50 AM  Result Value Ref Range   Prothrombin Time 15.5 (H) 11.4 - 15.2 seconds   INR 1.2 0.8 - 1.2    Comment: (NOTE) INR goal varies based on device and disease states. Performed at [facility], [address] [place], [place] [address]   APTT     Status: None    Collection Time: 06/02/21  4:50 AM  Result Value Ref Range   aPTT 29 24 - 36 seconds    Comment: Performed at [facility], [address] [place], [place] [address]  Urinalysis, Routine w reflex microscopic Urine, Clean Catch     Status: Abnormal   Collection Time: 06/02/21  7:45 AM  Result Value Ref Range   Color, Urine AMBER (A) YELLOW    Comment: BIOCHEMICALS MAY BE AFFECTED BY COLOR   APPearance HAZY (A) CLEAR   Specific Gravity, Urine 1.020 1.005 - 1.030   pH 5.5 5.0 - 8.0   Glucose, UA NEGATIVE NEGATIVE mg/dL   Hgb urine dipstick TRACE (A) NEGATIVE   Bilirubin Urine MODERATE (A) NEGATIVE   Ketones, ur NEGATIVE NEGATIVE mg/dL   Protein, ur 30 (A) NEGATIVE mg/dL   Nitrite POSITIVE (A) NEGATIVE   Leukocytes,Ua NEGATIVE NEGATIVE    Comment: Performed at [facility], [address] [place], [place] [address]  Pregnancy, urine     Status: None   Collection Time: 06/02/21  7:45 AM  Result Value Ref Range   Preg Test, Ur NEGATIVE NEGATIVE    Comment:        THE SENSITIVITY OF THIS METHODOLOGY IS >20 mIU/mL. Performed at [facility], [address] [place], [place] [address]   Urinalysis, Microscopic (reflex)     Status: Abnormal   Collection Time: 06/02/21  7:45 AM  Result Value Ref Range   RBC / HPF 0-5 0 - 5 RBC/hpf   WBC, UA 6-10 0 - 5 WBC/hpf   Bacteria, UA MANY (A) NONE SEEN   Squamous Epithelial / LPF 0-5 0 - 5   Non Squamous Epithelial PRESENT (A) NONE SEEN    Comment: Performed at [facility], [address] [place], [place] [address]  Body fluid cell count with differential     Status: Abnormal   Collection Time: 06/02/21  3:01 PM  Result Value Ref Range   Fluid Type-FCT PLEU LEFT    Color, Fluid YELLOW YELLOW   Appearance, Fluid TURBID (A) CLEAR   Total Nucleated Cell Count, Fluid 10,625 (H) 0 - 1,000 cu mm    Comment: COUNT MAY BE INACCURATE DUE TO FIBRIN CLUMPS.   Neutrophil Count, Fluid 92 (H) 0 - 25 %    Lymphs, Fluid 4 %   Monocyte-Macrophage-Serous Fluid 4 (L) 50 - 90 %   Eos, Fluid 0 %   Other Cells, Fluid MICROORGANSIM PRESENT, CORRELATE WITH MIRCOBIOLOGY %    Comment: Performed at [facility], [address], [place], [place] [address]  Glucose, pleural or peritoneal fluid     Status: None   Collection Time: 06/02/21  3:01 PM  Result Value Ref Range   Glucose, Fluid <20 mg/dL   Fluid Type-FGLU PLEU LEFT     Comment: Performed at [facility], [address], [place], [place] [address]  Lactate dehydrogenase (pleural or peritoneal fluid)     Status: Abnormal   Collection Time: 06/02/21  3:01 PM  Result Value Ref Range   LD, Fluid 4,696 (H) 3 - 23 U/L    Comment: RESULTS CONFIRMED BY MANUAL DILUTION (NOTE) Results should be evaluated in conjunction with serum values    Fluid Type-FLDH Pleural, L     Comment: Performed at [facility], [address], [place], [place] [address]  Protein, pleural or peritoneal fluid     Status: None   Collection Time: 06/02/21  3:01 PM  Result Value Ref Range   Total protein, fluid 5.4 g/dL    Comment: (NOTE) No normal range established for this test Results should be evaluated in conjunction with serum values    Fluid Type-FTP PLEU LEFT     Comment: Performed at [facility], [address], [place], [place] [address]  Iron and TIBC     Status: Abnormal   Collection Time: 06/03/21  1:52 AM  Result Value Ref Range   Iron 16 (L) 28 - 170 ug/dL   TIBC 264 (L) 158 - 309 ug/dL   Saturation Ratios 8 (L) 10.4 - 31.8 %   UIBC 183 ug/dL    Comment: Performed at [facility], [address] [place], [place] [address]  Ferritin     Status: None   Collection Time: 06/03/21  1:52 AM  Result Value Ref Range   Ferritin 108 11 - 307 ng/mL    Comment: Performed at [facility], [address], [place], [place] [address]  Magnesium     Status: None   Collection Time: 06/03/21  1:52 AM  Result Value Ref Range    Magnesium 1.7 1.7 - 2.4 mg/dL    Comment: Performed at [facility], [address] [place], [place] [address]  Reticulocytes     Status: Abnormal   Collection Time: 06/03/21  1:52 AM  Result Value Ref Range   Retic Ct Pct 2.9 0.4 - 3.1 %   RBC. 3.14 (L) 3.87 - 5.11 MIL/uL   Retic Count, Absolute 90.1 19.0 - 186.0 K/uL   Immature Retic Fract 27.5 (H) 2.3 - 15.9 %    Comment: Performed at [facility], [address], [place], [place] [address]  CBC     Status: Abnormal   Collection Time: 06/03/21  1:52 AM  Result Value Ref Range   WBC 12.5 (H) 4.0 -  10.5 K/uL   RBC 3.13 (L) 3.87 - 5.11 MIL/uL   Hemoglobin 7.1 (L) 12.0 - 15.0 g/dL    Comment: Reticulocyte Hemoglobin testing may be clinically indicated, consider ordering this additional test [id number]    HCT 23.5 (L) 36.0 - 46.0 %   MCV 75.1 (L) 80.0 - 100.0 fL   MCH 22.7 (L) 26.0 - 34.0 pg   MCHC 30.2 30.0 - 36.0 g/dL   RDW 14.7 (H) 82.9 - 56.2 %   Platelets 407 (H) 150 - 400 K/uL   nRBC 0.0 0.0 - 0.2 %    Comment: Performed at [facility], [address] [place], [place] [address]  Basic metabolic panel     Status: Abnormal   Collection Time: 06/03/21  1:52 AM  Result Value Ref Range   Sodium 133 (L) 135 - 145 mmol/L   Potassium 4.2 3.5 - 5.1 mmol/L   Chloride 101 98 - 111 mmol/L   CO2 26 22 - 32 mmol/L   Glucose, Bld 102 (H) 70 - 99 mg/dL    Comment: Glucose reference range applies only to samples taken after fasting for at least 8 hours.   BUN 8 6 - 20 mg/dL   Creatinine, Ser 5.78 0.44 - 1.00 mg/dL   Calcium 7.4 (L) 8.9 - 10.3 mg/dL   GFR, Estimated >46 >96 mL/min    Comment: (NOTE) Calculated using the CKD-EPI Creatinine Equation (2021)    Anion gap 6 5 - 15    Comment: Performed at [facility], [address] [place], [place] [address]    DG Ankle Complete Right  Result Date: 06/02/2021 CLINICAL DATA:  30 year old female with sepsis. Bilateral leg swelling. EXAM: RIGHT ANKLE -  COMPLETE 3+ VIEW COMPARISON:  No priors. FINDINGS: There is no evidence of fracture, dislocation, or joint effusion. There is no evidence of arthropathy or other focal bone abnormality. Soft tissues are diffusely swollen. IMPRESSION: 1. Diffuse swelling of the soft tissues around the right ankle joint. No acute osseous abnormality. Electronically Signed   By: Trudie Reed M.D.   On: 06/02/2021 05:12   CT Angio Chest PE W/Cm &/Or Wo Cm  Addendum Date: 06/02/2021   ADDENDUM REPORT: 06/02/2021 09:08 ADDENDUM: As mentioned in the body of the report there is extensive soft tissue stranding in the subcutaneous fat of the right axillary region where there also appears to be a small amount of gas in the soft tissues. Clinical correlation for signs and symptoms of acute soft tissue infection in the right upper extremity is recommended. Electronically Signed   By: Trudie Reed M.D.   On: 06/02/2021 09:08   Result Date: 06/02/2021 CLINICAL DATA:  30 year old female with history of IV drug abuse. Possible septic emboli. Possible infectious endocarditis. EXAM: CT ANGIOGRAPHY CHEST WITH CONTRAST TECHNIQUE: Multidetector CT imaging of the chest was performed using the standard protocol during bolus administration of intravenous contrast. Multiplanar CT image reconstructions and MIPs were obtained to evaluate the vascular anatomy. CONTRAST:  OMNIPAQUE IOHEXOL 350 MG/ML SOLN COMPARISON:  No priors. FINDINGS: Cardiovascular: There are no filling defects within the pulmonary arterial tree to suggest pulmonary embolism. Heart size is normal. There is no significant pericardial fluid, thickening or pericardial calcification. No atherosclerotic calcifications are noted in the thoracic aorta or the coronary arteries. Mediastinum/Nodes: Prominent but nonenlarged mediastinal and left hilar lymph nodes are nonspecific, but likely reactive. Esophagus is unremarkable in appearance. No axillary lymphadenopathy. However,  there is extensive soft tissue stranding  and what appears to be some fluid in the right axillary region. A small amount of gas is also noted in the soft tissues of the proximal right upper extremity, incompletely imaged. Lungs/Pleura: Large complex left pleural effusion with extensive pleural enhancement which appears partially loculated. This is associated with extensive areas of passive atelectasis in the left lung, including near complete atelectasis of the left lower lobe. Some cavitary areas are noted in the inferior segment of the lingula. Right lung is clear. No right pleural effusion. No definite suspicious appearing pulmonary nodules or masses are noted. Upper Abdomen: Spleen is incompletely imaged, but appears likely enlarged measuring approximally 13.6 x 5.6 cm on axial images. Musculoskeletal: There are no aggressive appearing lytic or blastic lesions noted in the visualized portions of the skeleton. Review of the MIP images confirms the above findings. IMPRESSION: 1. No evidence of pulmonary embolism. 2. Area of cavitation in the inferior segment of the lingula, likely secondary to necrotizing pneumonia. This is associated with a large left-sided empyema, and extensive passive atelectasis in the left lung, most notably in the left lower lobe. 3. Probable splenomegaly. Electronically Signed: By: Trudie Reed M.D. On: 06/02/2021 06:54   DG Chest Port 1 View  Result Date: 06/02/2021 CLINICAL DATA:  Status post chest tube placement. EXAM: PORTABLE CHEST 1 VIEW COMPARISON:  Same day chest radiograph at 0415 hours and same day chest CT. FINDINGS: Interval placement of a left basilar pigtail thoracostomy tube. Decreased size of the left-sided empyema with increased expansion of the left lung. Left basilar airspace consolidation. The heart size and mediastinal contours are unchanged. The visualized skeletal structures are unchanged. IMPRESSION: Interval placement of left basilar pigtail thoracostomy  tube with decreased size of the left-sided empyema. Electronically Signed   By: Maudry Mayhew M.D.   On: 06/02/2021 15:54   DG Chest Port 1 View  Result Date: 06/02/2021 CLINICAL DATA:  30 year old female with sepsis. Bilateral leg swelling. History of IV drug abuse. EXAM: PORTABLE CHEST 1 VIEW COMPARISON:  Chest x-ray 03/29/2018. FINDINGS: Opacity throughout the left hemithorax, favored to reflect a combination of atelectasis and/or consolidation in the left lung base, likely with large left pleural effusion, however, there are some irregular lucencies within the left mid to lower hemithorax. Right lung appears clear. No right pleural effusion. No pneumothorax. No evidence of pulmonary edema. Heart size is normal. Upper mediastinal contours are within normal limits. IMPRESSION: 1. Grossly abnormal chest x-ray with unusual features. Findings suggest a combination of airspace consolidation, atelectasis and complex pleural fluid collection in the left hemithorax. Correlation with forthcoming chest CTA is recommended to better characterize these findings. Electronically Signed   By: Trudie Reed M.D.   On: 06/02/2021 05:11    Review of Systems  Respiratory:  Positive for shortness of breath.   Musculoskeletal:  Positive for arthralgias, joint swelling and myalgias.  Blood pressure (!) 95/58, pulse 91, temperature 98 F (36.7 C), temperature source Oral, resp. rate 18, height  (1.6 m), weight 85.7 kg, SpO2 100 %. Physical Exam HENT:     Head: Atraumatic.  Eyes:     Extraocular Movements: Extraocular movements intact.  Cardiovascular:     Pulses: Normal pulses.  Musculoskeletal:     Comments: Right lower extremity with mild erythema and moderate swelling from the mid calf to the toes.she does have tenderness to palpation over the soft tissues of the calf and ankle.  She is able to gently wiggle her ankle up and down with some  discomfort.  She is able to wiggle her toes up and down.  I am  unable to palpate any specific area of induration.  Neurological:     Mental Status: She is alert.    Assessment/Plan:  Right ankle pain and swelling, likely cellulitis. At this point concern for septic ankle joint is low given her negative joint aspiration and significant response in the last 24 hours to IV antibiotics. At this point I would recommend continuing with conservative management with elevation, compression and IV antibiotics. If this fails to continue to respond to that treatment she may need repeat aspiration attempt or possibly an MRI.       Glennon Hamilton 06/03/2021, 8:13 AM

## 2021-06-03 NOTE — Procedures (Addendum)
Pleural Fibrinolytic Administration Procedure Note  Sharon [name]  [id number]  07-25-1990  Date:06/03/21  Time:12:17 PM   Provider Performing:Pete E Tanja Port   Procedure: Pleural Fibrinolysis Subsequent day (74259)  Indication(s) Fibrinolysis of complicated pleural effusion  Consent Risks of the procedure as well as the alternatives and risks of each were explained to the patient and/or caregiver.  Consent for the procedure was obtained.   Anesthesia None   Time Out Verified patient identification, verified procedure, site/side was marked, verified correct patient position, special equipment/implants available, medications/allergies/relevant history reviewed, required imaging and test results available.   Sterile Technique Hand hygiene, gloves   Procedure Description Existing pleural catheter was cleaned and accessed in sterile manner.  10mg  of tPA in 30cc of saline and 5mg  of dornase in 30cc of sterile water were injected into pleural space using existing pleural catheter.  Catheter will be clamped for 1 hour and then placed back to suction.   Complications/Tolerance None; patient tolerated the procedure well.   EBL None   Specimen(s) None ACNP-BC [facility] Pulmonary/Critical Care Pager # [phone] OR # [phone] if no answer

## 2021-06-03 NOTE — Progress Notes (Signed)
PROGRESS NOTE  Sharon [name]  FYT:[id number] DOB: 1991/01/11 DOA: 06/02/2021 PCP: Patient, No Pcp Per (Inactive)   Brief Narrative: Sharon [name] is a 30 y.o. female with a history of IVDU, tobacco use, anxiety who presented to the ED on 12/8 with infected wound of the left leg, painful swelling of the right ankle, and fever with cough. She was tachycardic with borderline hypotension, given IVF. WBC 20k. CTA chest revealed cavitary pneumonia with associated loculated left pleural effusion. PCCM was consulted, placed pigtail catheter and is proceeding with fibrinolytic. Right ankle was tapped with no fluid return in ED. Swelling has improved with IV antibiotics alone, so orthopedics not currently recommending surgical management. Blood culture and pleural fluid culture are pending, echocardiogram ordered. ID guiding antibiotics, changed now to vancomycin and cefepime.  Assessment & Plan: Principal Problem:   Sepsis (HCC) Active Problems:   IV drug abuse (HCC)   Substance abuse (HCC)   Empyema of pleura (HCC)  Sepsis due to multiple infections, concern for bacteremia in IVDU:  - Continue vancomycin, cefepime, can stop metronidazole per ID.  - Monitor blood cultures (only 1 set drawn on admission, repeat another now). - Vitals stabilizing. Can stop IVF once taking po. - Echocardiogram pending  Cavitary lingula pneumonia and empyema:  - Appreciate PCCM management s/p chest tube 12/8, instilling lytics. - F/u pleural fluid culture  Right ankle cellulitis: Possible septic arthritis, though felt less likely with significant improvement and dry tap in ED. - MR ordered for further investigation. Appreciate orthopedics recommendations. - LE venous U/S negative for DVT  Right axillary stranding: Unclear significance. No complaints from patient or findings on exam to suggest another site of infection, though could be from UE injections.  - MR right arm when able.  Left calf abscess:  Spontaneously draining.  - Continue local wound care and abx as above.  IVDU, narcotic use:  - Monitor for withdrawal.  - Clonidine prn - Cessation counseling provided - HIV NR, otherinfectious work up per ID  Tobacco use:  - Cessation counseling - Nicotine patch  Microcytic anemia: Exacerbated by IVF causing hemodilution.  - Recheck H/H with T&S this PM, transfuse for hgb <7g/dl.  Hypokalemia: Resolved  Anxiety:  - Xanax prn  Obesity: Estimated body mass index is 33.47 kg/m as calculated from the following:   Height as of this encounter: 5\' 3"  (1.6 m).   Weight as of this encounter: 85.7 kg.  DVT prophylaxis: Lovenox Code Status: Full Family Communication: Boyfriend at bedside this AM Disposition Plan:  Status is: Inpatient  Remains inpatient appropriate because: continued work up of multiple infections, requiring IV abx.  Consultants:  PCCM Orthopedics Infectious diseases  Procedures:  Left pigtail catheter/chest tube placement 06/02/2021  Antimicrobials: Vancomycin cefepime >>  Flagyl 12/8 - 12/9   Subjective: Pain is improved overall but still moderate, primary complaint is discomfort from chest tube at this time and hunger. No fever or chills. No right arm pain at all or wounds.  Objective: Vitals:   06/02/21 2322 06/03/21 0412 06/03/21 0851 06/03/21 1211  BP: 94/63 (!) 95/58 (!) 98/56 (!) 100/54  Pulse: 98 91 94 97  Resp: 20 18 16 20   Temp: 98.2 F (36.8 C) 98 F (36.7 C) 98.2 F (36.8 C) 98.5 F (36.9 C)  TempSrc: Oral Oral Oral Oral  SpO2: 100% 100% 99% 95%  Weight:      Height:        Intake/Output Summary (Last 24 hours) at 06/03/2021 1305 Last  data filed at 06/03/2021 1212 Gross per 24 hour  Intake 1451.17 ml  Output 1100 ml  Net 351.17 ml   Filed Weights   06/02/21 0036 06/02/21 0456 06/02/21 1331  Weight: 89.8 kg 86.4 kg 85.7 kg    Gen: 30 y.o. female in no distress, chronically ill-appearing Pulm: Non-labored breathing. Clear  to auscultation bilaterally.  CV: Regular rate and rhythm. No significant murmur, rub, or gallop. No JVD, no pitting symmetric pedal edema. GI: Abdomen soft, non-tender, non-distended, with normoactive bowel sounds. No organomegaly or masses felt. Ext: Warm, R foot/ankle with ill-defined tender erythematous edema. Left upper calf with wound dressing c/d/i. Left chest tube site c/d/I, no air leak. Skin: As above Neuro: Alert and oriented. No focal neurological deficits. Psych: Judgement and insight appear normal. Mood & affect appropriate.   Data Reviewed: I have personally reviewed following labs and imaging studies  CBC: Recent Labs  Lab 06/02/21 0221 06/03/21 0152  WBC 20.5* 12.5*  NEUTROABS 15.4*  --   HGB 8.7* 7.1*  HCT 28.8* 23.5*  MCV 74.2* 75.1*  PLT 489* 407*   Basic Metabolic Panel: Recent Labs  Lab 06/02/21 0221 06/03/21 0152  NA 131* 133*  K 3.2* 4.2  CL 95* 101  CO2 26 26  GLUCOSE 108* 102*  BUN 10 8  CREATININE 0.69 0.56  CALCIUM 7.8* 7.4*  MG  --  1.7   GFR: Estimated Creatinine Clearance: 106.6 mL/min (by C-G formula based on SCr of 0.56 mg/dL). Liver Function Tests: Recent Labs  Lab 06/02/21 0221  AST 29  ALT 21  ALKPHOS 106  BILITOT 1.8*  PROT 8.4*  ALBUMIN 2.0*   No results for input(s): LIPASE, AMYLASE in the last 168 hours. No results for input(s): AMMONIA in the last 168 hours. Coagulation Profile: Recent Labs  Lab 06/02/21 0450  INR 1.2   Cardiac Enzymes: No results for input(s): CKTOTAL, CKMB, CKMBINDEX, TROPONINI in the last 168 hours. BNP (last 3 results) No results for input(s): PROBNP in the last 8760 hours. HbA1C: No results for input(s): HGBA1C in the last 72 hours. CBG: No results for input(s): GLUCAP in the last 168 hours. Lipid Profile: No results for input(s): CHOL, HDL, LDLCALC, TRIG, CHOLHDL, LDLDIRECT in the last 72 hours. Thyroid Function Tests: No results for input(s): TSH, T4TOTAL, FREET4, T3FREE, THYROIDAB in  the last 72 hours. Anemia Panel: Recent Labs    06/03/21 0152  FERRITIN 108  TIBC 199*  IRON 16*  RETICCTPCT 2.9   Urine analysis:    Component Value Date/Time   COLORURINE AMBER (A) 06/02/2021 0745   APPEARANCEUR HAZY (A) 06/02/2021 0745   LABSPEC 1.020 06/02/2021 0745   PHURINE 5.5 06/02/2021 0745   GLUCOSEU NEGATIVE 06/02/2021 0745   HGBUR TRACE (A) 06/02/2021 0745   BILIRUBINUR MODERATE (A) 06/02/2021 0745   KETONESUR NEGATIVE 06/02/2021 0745   PROTEINUR 30 (A) 06/02/2021 0745   NITRITE POSITIVE (A) 06/02/2021 0745   LEUKOCYTESUR NEGATIVE 06/02/2021 0745   Recent Results (from the past 240 hour(s))  Resp Panel by RT-PCR (Flu A&B, Covid) Nasopharyngeal Swab     Status: None   Collection Time: 06/02/21  4:50 AM   Specimen: Nasopharyngeal Swab; Nasopharyngeal(NP) swabs in vial transport medium  Result Value Ref Range Status   SARS Coronavirus 2 by RT PCR NEGATIVE NEGATIVE Final    Comment: (NOTE) SARS-CoV-2 target nucleic acids are NOT DETECTED.  The SARS-CoV-2 RNA is generally detectable in upper respiratory specimens during the acute phase of infection. The lowest  concentration of SARS-CoV-2 viral copies this assay can detect is 138 copies/mL. A negative result does not preclude SARS-Cov-2 infection and should not be used as the sole basis for treatment or other patient management decisions. A negative result may occur with  improper specimen collection/handling, submission of specimen other than nasopharyngeal swab, presence of viral mutation(s) within the areas targeted by this assay, and inadequate number of viral copies(<138 copies/mL). A negative result must be combined with clinical observations, patient history, and epidemiological information. The expected result is Negative.  Fact Sheet for Patients:  BloggerCourse.com  Fact Sheet for Healthcare Providers:  SeriousBroker.it  This test is no t yet  approved or cleared by the [place] FDA and  has been authorized for detection and/or diagnosis of SARS-CoV-2 by FDA under an Emergency Use Authorization (EUA). This EUA will remain  in effect (meaning this test can be used) for the duration of the COVID-19 declaration under Section 564(b)(1) of the Act, 21 U.S.C.section 360bbb-3(b)(1), unless the authorization is terminated  or revoked sooner.       Influenza A by PCR NEGATIVE NEGATIVE Final   Influenza B by PCR NEGATIVE NEGATIVE Final    Comment: (NOTE) The Xpert Xpress SARS-CoV-2/FLU/RSV plus assay is intended as an aid in the diagnosis of influenza from Nasopharyngeal swab specimens and should not be used as a sole basis for treatment. Nasal washings and aspirates are unacceptable for Xpert Xpress SARS-CoV-2/FLU/RSV testing.  Fact Sheet for Patients: BloggerCourse.com  Fact Sheet for Healthcare Providers: SeriousBroker.it  This test is not yet approved or cleared by the [place] FDA and has been authorized for detection and/or diagnosis of SARS-CoV-2 by FDA under an Emergency Use Authorization (EUA). This EUA will remain in effect (meaning this test can be used) for the duration of the COVID-19 declaration under Section 564(b)(1) of the Act, 21 U.S.C. section 360bbb-3(b)(1), unless the authorization is terminated or revoked.  Performed at [facility], [address] [place], [place] [address]   Blood Culture (routine x 2)     Status: None (Preliminary result)   Collection Time: 06/02/21  4:50 AM   Specimen: Left Antecubital; Blood  Result Value Ref Range Status   Specimen Description   Final    LEFT ANTECUBITAL Performed at [facility], [address] [place], [place] [address]    Special Requests   Final    BOTTLES DRAWN AEROBIC AND ANAEROBIC Blood Culture adequate volume Performed at [facility], [address]  [place], [place] [address]    Culture   Final    NO GROWTH < 24 HOURS Performed at [facility], [address], [place], [place] [address]    Report Status PENDING  Incomplete  Urine Culture     Status: Abnormal   Collection Time: 06/02/21  7:45 AM   Specimen: In/Out Cath Urine  Result Value Ref Range Status   Specimen Description   Final    IN/OUT CATH URINE Performed at [facility], [address] [place], [place] [address]    Special Requests   Final    NONE Performed at [facility], [address] [place], [place] [address]    Culture MULTIPLE SPECIES PRESENT, SUGGEST RECOLLECTION (A)  Final   Report Status 06/03/2021 FINAL  Final      Radiology Studies: DG Ankle Complete Right  Result Date: 06/02/2021 CLINICAL DATA:  30 year old female with sepsis. Bilateral  leg swelling. EXAM: RIGHT ANKLE - COMPLETE 3+ VIEW COMPARISON:  No priors. FINDINGS: There is no evidence of fracture, dislocation, or joint effusion. There is no evidence of arthropathy or other focal bone abnormality. Soft tissues are diffusely swollen. IMPRESSION: 1. Diffuse swelling of the soft tissues around the right ankle joint. No acute osseous abnormality. Electronically Signed   By: Trudie Reed M.D.   On: 06/02/2021 05:12   CT Angio Chest PE W/Cm &/Or Wo Cm  Addendum Date: 06/02/2021   ADDENDUM REPORT: 06/02/2021 09:08 ADDENDUM: As mentioned in the body of the report there is extensive soft tissue stranding in the subcutaneous fat of the right axillary region where there also appears to be a small amount of gas in the soft tissues. Clinical correlation for signs and symptoms of acute soft tissue infection in the right upper extremity is recommended. Electronically Signed   By: Trudie Reed M.D.   On: 06/02/2021 09:08   Result Date: 06/02/2021 CLINICAL DATA:  30 year old female with history of IV drug abuse. Possible septic emboli. Possible infectious endocarditis. EXAM: CT  ANGIOGRAPHY CHEST WITH CONTRAST TECHNIQUE: Multidetector CT imaging of the chest was performed using the standard protocol during bolus administration of intravenous contrast. Multiplanar CT image reconstructions and MIPs were obtained to evaluate the vascular anatomy. CONTRAST:  OMNIPAQUE IOHEXOL 350 MG/ML SOLN COMPARISON:  No priors. FINDINGS: Cardiovascular: There are no filling defects within the pulmonary arterial tree to suggest pulmonary embolism. Heart size is normal. There is no significant pericardial fluid, thickening or pericardial calcification. No atherosclerotic calcifications are noted in the thoracic aorta or the coronary arteries. Mediastinum/Nodes: Prominent but nonenlarged mediastinal and left hilar lymph nodes are nonspecific, but likely reactive. Esophagus is unremarkable in appearance. No axillary lymphadenopathy. However, there is extensive soft tissue stranding and what appears to be some fluid in the right axillary region. A small amount of gas is also noted in the soft tissues of the proximal right upper extremity, incompletely imaged. Lungs/Pleura: Large complex left pleural effusion with extensive pleural enhancement which appears partially loculated. This is associated with extensive areas of passive atelectasis in the left lung, including near complete atelectasis of the left lower lobe. Some cavitary areas are noted in the inferior segment of the lingula. Right lung is clear. No right pleural effusion. No definite suspicious appearing pulmonary nodules or masses are noted. Upper Abdomen: Spleen is incompletely imaged, but appears likely enlarged measuring approximally 13.6 x 5.6 cm on axial images. Musculoskeletal: There are no aggressive appearing lytic or blastic lesions noted in the visualized portions of the skeleton. Review of the MIP images confirms the above findings. IMPRESSION: 1. No evidence of pulmonary embolism. 2. Area of cavitation in the inferior segment of the  lingula, likely secondary to necrotizing pneumonia. This is associated with a large left-sided empyema, and extensive passive atelectasis in the left lung, most notably in the left lower lobe. 3. Probable splenomegaly. Electronically Signed: By: Trudie Reed M.D. On: 06/02/2021 06:54   MR HUMERUS RIGHT W WO CONTRAST  Result Date: 06/03/2021 Simonne Martinet, NP     06/03/2021 12:17 PM Pleural Fibrinolytic Administration Procedure Note KARRA PINK [id number] Dec 01, 1990 Date:06/03/21 Time:12:17 PM Provider Performing:Pete E Tanja Port Procedure: Pleural Fibrinolysis Subsequent day [phone]) Indication(s) Fibrinolysis of complicated pleural effusion Consent Risks of the procedure as well as the alternatives and risks of each were explained to the patient and/or caregiver.  Consent for the procedure was obtained. Anesthesia None Time Out Verified patient identification, verified  procedure, site/side was marked, verified correct patient position, special equipment/implants available, medications/allergies/relevant history reviewed, required imaging and test results available. Sterile Technique Hand hygiene, gloves Procedure Description Existing pleural catheter was cleaned and accessed in sterile manner.   of tPA in 30cc of saline and  of dornase in 30cc of sterile water were injected into pleural space using existing pleural catheter.  Catheter will be clamped for 1 hour and then placed back to suction. Complications/Tolerance None; patient tolerated the procedure well. EBL None Specimen(s) None Simonne Martinet ACNP-BC [facility] Pulmonary/Critical Care Pager # [phone] OR # [phone] if no answer   DG Chest Port 1 View  Result Date: 06/02/2021 CLINICAL DATA:  Status post chest tube placement. EXAM: PORTABLE CHEST 1 VIEW COMPARISON:  Same day chest radiograph at 0415 hours and same day chest CT. FINDINGS: Interval placement of a left basilar pigtail thoracostomy tube. Decreased size of the left-sided empyema  with increased expansion of the left lung. Left basilar airspace consolidation. The heart size and mediastinal contours are unchanged. The visualized skeletal structures are unchanged. IMPRESSION: Interval placement of left basilar pigtail thoracostomy tube with decreased size of the left-sided empyema. Electronically Signed   By: Maudry Mayhew M.D.   On: 06/02/2021 15:54   DG Chest Port 1 View  Result Date: 06/02/2021 CLINICAL DATA:  30 year old female with sepsis. Bilateral leg swelling. History of IV drug abuse. EXAM: PORTABLE CHEST 1 VIEW COMPARISON:  Chest x-ray 03/29/2018. FINDINGS: Opacity throughout the left hemithorax, favored to reflect a combination of atelectasis and/or consolidation in the left lung base, likely with large left pleural effusion, however, there are some irregular lucencies within the left mid to lower hemithorax. Right lung appears clear. No right pleural effusion. No pneumothorax. No evidence of pulmonary edema. Heart size is normal. Upper mediastinal contours are within normal limits. IMPRESSION: 1. Grossly abnormal chest x-ray with unusual features. Findings suggest a combination of airspace consolidation, atelectasis and complex pleural fluid collection in the left hemithorax. Correlation with forthcoming chest CTA is recommended to better characterize these findings. Electronically Signed   By: Trudie Reed M.D.   On: 06/02/2021 05:11   VAS [place] LOWER EXTREMITY VENOUS (DVT)  Result Date: 06/03/2021  Lower Venous DVT Study Patient Name:  LUCIENNE SAWYERS  Date of Exam:   06/03/2021 Medical Rec #: [id number]         Accession #:    [id number] Date of Birth: [birth year]          Patient Gender: F Patient Age:   30 years Exam Location[facility]  [facility] Procedure:      VAS [place] LOWER EXTREMITY VENOUS (DVT) Referring Phys: Mikey College --------------------------------------------------------------------------------  Indications: RT ankle swelling, IV drug user.  Comparison Study: No  prior studies. Performing Technologist: Jean Rosenthal RDMS, RVT  Examination Guidelines: A complete evaluation includes B-mode imaging, spectral Doppler, color Doppler, and power Doppler as needed of all accessible portions of each vessel. Bilateral testing is considered an integral part of a complete examination. Limited examinations for reoccurring indications may be performed as noted. The reflux portion of the exam is performed with the patient in reverse Trendelenburg.  +---------+---------------+---------+-----------+----------+--------------+ RIGHT    CompressibilityPhasicitySpontaneityPropertiesThrombus Aging +---------+---------------+---------+-----------+----------+--------------+ CFV      Full           Yes      Yes                                 +---------+---------------+---------+-----------+----------+--------------+  SFJ      Full                                                        +---------+---------------+---------+-----------+----------+--------------+ FV Prox  Full                                                        +---------+---------------+---------+-----------+----------+--------------+ FV Mid   Full                                                        +---------+---------------+---------+-----------+----------+--------------+ FV DistalFull                                                        +---------+---------------+---------+-----------+----------+--------------+ PFV      Full                                                        +---------+---------------+---------+-----------+----------+--------------+ POP      Full           Yes      Yes                                 +---------+---------------+---------+-----------+----------+--------------+ PTV      Full                                                        +---------+---------------+---------+-----------+----------+--------------+ PERO     Full                                                         +---------+---------------+---------+-----------+----------+--------------+ Gastroc  Full                                                        +---------+---------------+---------+-----------+----------+--------------+   +----+---------------+---------+-----------+----------+--------------+ LEFTCompressibilityPhasicitySpontaneityPropertiesThrombus Aging +----+---------------+---------+-----------+----------+--------------+ CFV Full           Yes      Yes                                 +----+---------------+---------+-----------+----------+--------------+  Summary: RIGHT: - There is no evidence of deep vein thrombosis in the lower extremity.  - No cystic structure found in the popliteal fossa.  LEFT: - No evidence of common femoral vein obstruction.  *See table(s) above for measurements and observations.    Preliminary     Scheduled Meds:  acidophilus  2 capsule Oral Daily   enoxaparin (LOVENOX) injection  40 mg Subcutaneous Q24H   guaiFENesin  1,200 mg Oral BID   nicotine  21 mg Transdermal Daily   sodium chloride flush  10 mL Other Q8H   Continuous Infusions:  ceFEPime (MAXIPIME) IV 2 g (06/03/21 0601)   lactated ringers 150 mL/hr at 06/03/21 1011   vancomycin 1,000 mg (06/03/21 1012)     LOS: 1 day   Time spent: 35 minutes.  Tyrone Nine, MD [facility] [email or web address] 06/03/2021, 1:05 PM

## 2021-06-04 ENCOUNTER — Inpatient Hospital Stay (HOSPITAL_COMMUNITY): Payer: Medicaid Other

## 2021-06-04 DIAGNOSIS — J869 Pyothorax without fistula: Secondary | ICD-10-CM | POA: Diagnosis not present

## 2021-06-04 LAB — CBC WITH DIFFERENTIAL/PLATELET
Abs Immature Granulocytes: 0.06 10*3/uL (ref 0.00–0.07)
Basophils Absolute: 0 10*3/uL (ref 0.0–0.1)
Basophils Relative: 0 %
Eosinophils Absolute: 0.2 10*3/uL (ref 0.0–0.5)
Eosinophils Relative: 2 %
HCT: 26.1 % — ABNORMAL LOW (ref 36.0–46.0)
Hemoglobin: 8.1 g/dL — ABNORMAL LOW (ref 12.0–15.0)
Immature Granulocytes: 1 %
Lymphocytes Relative: 29 %
Lymphs Abs: 2.4 10*3/uL (ref 0.7–4.0)
MCH: 23.4 pg — ABNORMAL LOW (ref 26.0–34.0)
MCHC: 31 g/dL (ref 30.0–36.0)
MCV: 75.4 fL — ABNORMAL LOW (ref 80.0–100.0)
Monocytes Absolute: 0.4 10*3/uL (ref 0.1–1.0)
Monocytes Relative: 5 %
Neutro Abs: 5.4 10*3/uL (ref 1.7–7.7)
Neutrophils Relative %: 63 %
Platelets: 420 10*3/uL — ABNORMAL HIGH (ref 150–400)
RBC: 3.46 MIL/uL — ABNORMAL LOW (ref 3.87–5.11)
RDW: 19.1 % — ABNORMAL HIGH (ref 11.5–15.5)
WBC: 8.5 10*3/uL (ref 4.0–10.5)
nRBC: 0 % (ref 0.0–0.2)

## 2021-06-04 LAB — BPAM RBC
Blood Product Expiration Date: 202212282359
ISSUE DATE / TIME: 202212091612
Unit Type and Rh: 6200

## 2021-06-04 LAB — TYPE AND SCREEN
ABO/RH(D): A POS
Antibody Screen: NEGATIVE
Unit division: 0

## 2021-06-04 LAB — COMPREHENSIVE METABOLIC PANEL
ALT: UNDETERMINED U/L (ref 0–44)
AST: 27 U/L (ref 15–41)
Albumin: <1.5 g/dL — ABNORMAL LOW (ref 3.5–5.0)
Alkaline Phosphatase: 69 U/L (ref 38–126)
Anion gap: 8 (ref 5–15)
BUN: 10 mg/dL (ref 6–20)
CO2: 22 mmol/L (ref 22–32)
Calcium: 7.6 mg/dL — ABNORMAL LOW (ref 8.9–10.3)
Chloride: 105 mmol/L (ref 98–111)
Creatinine, Ser: 0.49 mg/dL (ref 0.44–1.00)
GFR, Estimated: >60 mL/min (ref >60)
Glucose, Bld: 102 mg/dL — ABNORMAL HIGH (ref 70–99)
Potassium: 4.4 mmol/L (ref 3.5–5.1)
Sodium: 135 mmol/L (ref 135–145)
Total Bilirubin: UNDETERMINED mg/dL (ref 0.3–1.2)
Total Protein: 6.2 g/dL — ABNORMAL LOW (ref 6.5–8.1)

## 2021-06-04 LAB — RPR: RPR Ser Ql: NONREACTIVE

## 2021-06-04 IMAGING — DX DG CHEST 1V PORT
1 series · 1 of 1 positions shown · non-contrast
Comparison: [DATE].

CLINICAL DATA: Shortness of breath, chest pain.

EXAM:
PORTABLE CHEST 1 VIEW

[chest]
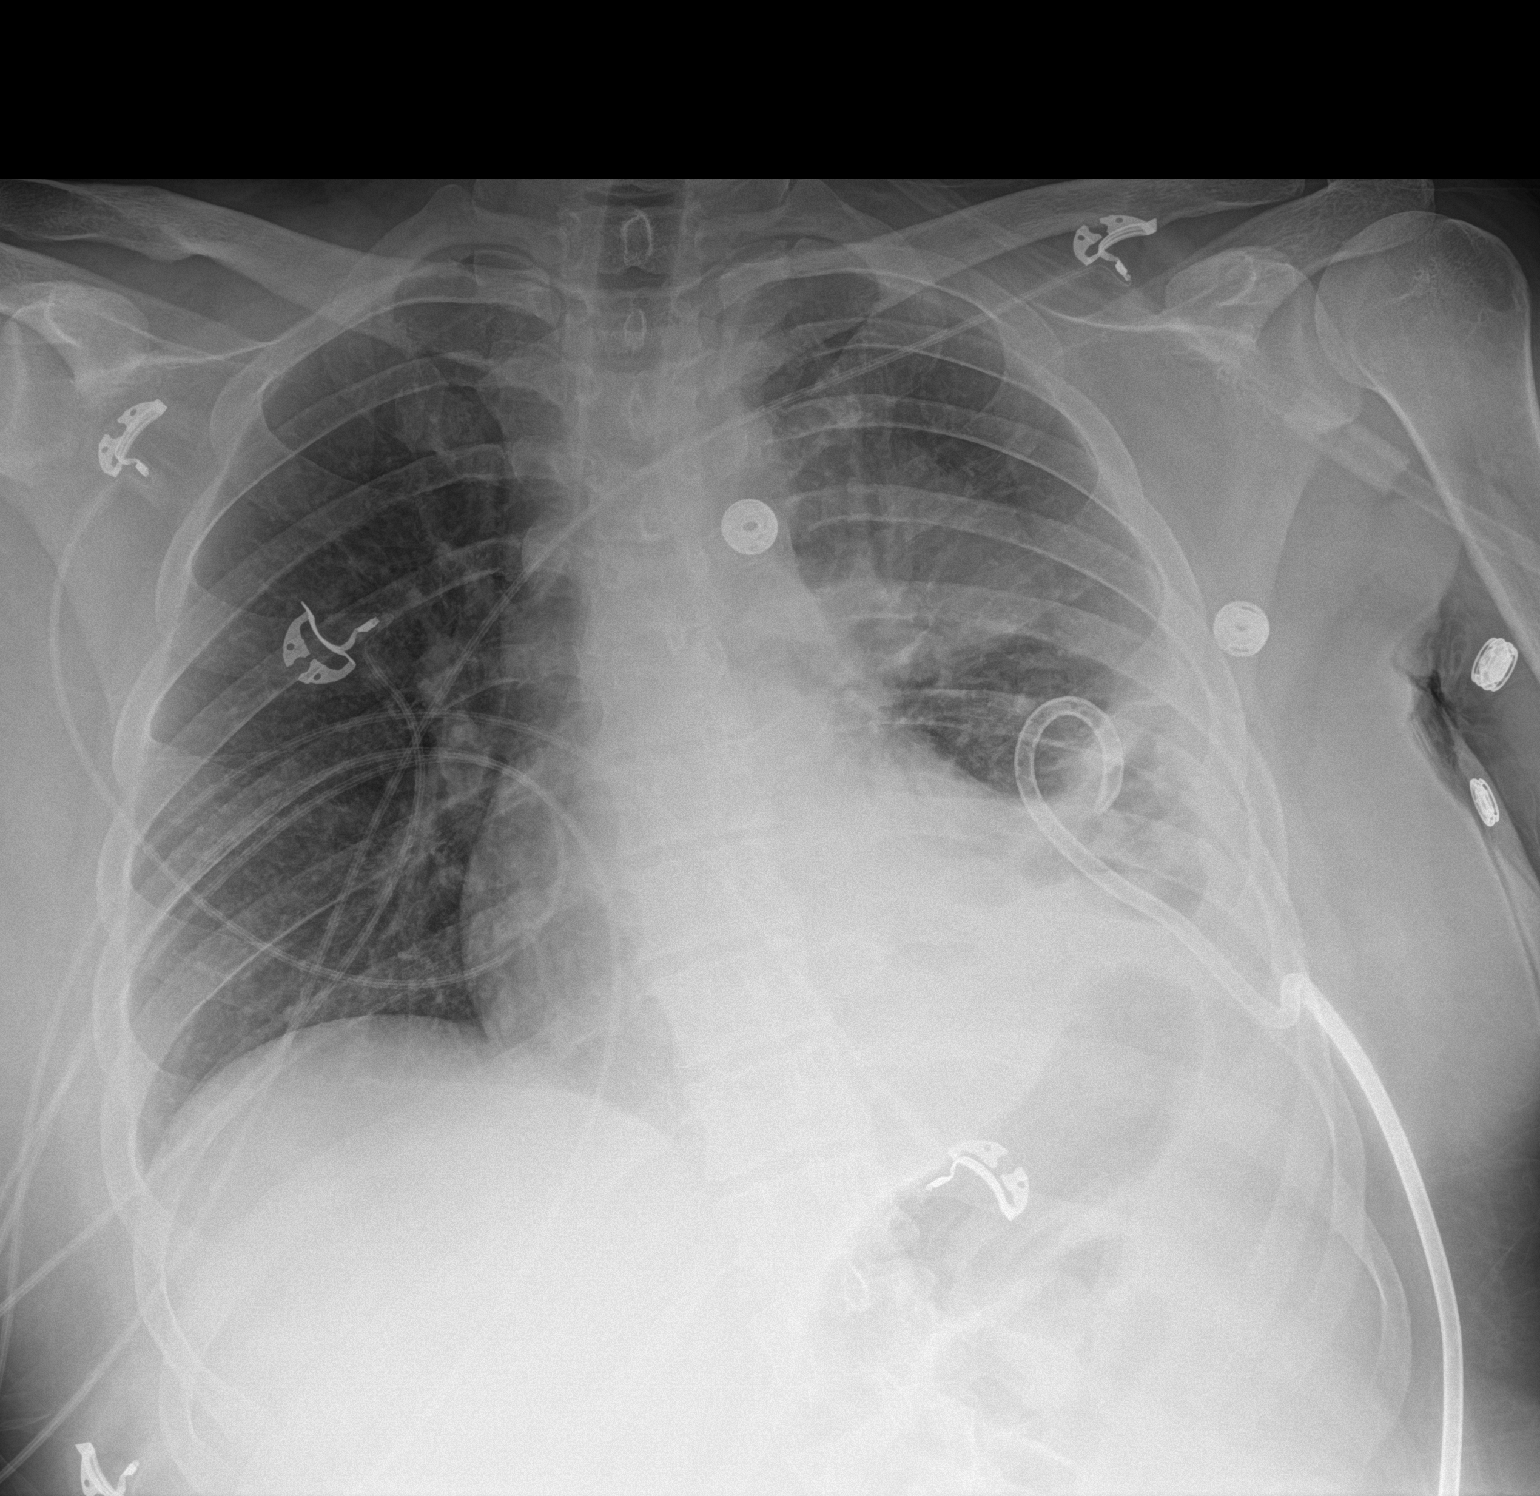

[1 of 1 positions shown; findings below may reference images not displayed]

FINDINGS: Stable cardiomediastinal silhouette. Lung is clear. Stable position
of left-sided chest tube. Mild left basilar atelectasis is noted
with small left pleural effusion. Left upper lobe opacity is noted
concerning for atelectasis or infiltrate. Bony thorax is
unremarkable.
IMPRESSION: Stable position of left-sided chest tube without pneumothorax. Left
basilar atelectasis is noted with small left pleural effusion.
Stable left upper lobe opacity as described above.

## 2021-06-04 MED ORDER — LORAZEPAM 2 MG/ML IJ SOLN
1.0000 mg | Freq: Once | INTRAMUSCULAR | Status: AC
  Administered 2021-06-04: 1 mg via INTRAVENOUS
  Filled 2021-06-04: qty 1

## 2021-06-04 NOTE — Consult Note (Signed)
Vascular and Interventional Radiology  Brief Consult Note  Patient: Sharon [name] DOB: 1991-03-10 Medical Record Number: [id number] Note Date/Time: 06/04/21 3:12 PM   Admitting Diagnosis:  Necrotizing pneumonia (HCC) [J85.0] Abscess [L02.91] Empyema lung (HCC) [J86.9] Sepsis (HCC) [A41.9] Sepsis with acute organ dysfunction without septic shock, due to unspecified organism, unspecified type (HCC) [A41.9, R65.20]  1. Sepsis with acute organ dysfunction without septic shock, due to unspecified organism, unspecified type (HCC)   2. Empyema lung (HCC)   3. Necrotizing pneumonia (HCC)   4. Abscess   5. Chest tube in place   6. Empyema of pleura (HCC)   7. Respiration abnormal      Assessment  Plan: 30 y.o. year old female who's Primary MD reached out to VIR On Call Physician with concern for ST abscess seen on recent MR RLE  - location and size of collection not amenable to percutaneous drainage with catheter - consider aspiration / incisional drainage for appropriate management   Imaging reviewed independently showing subcutaneous abscesses, largest at medial knee     Thank you for allowing [place] to participate in the care of your Patient. Please contact the VIR Consult Team with questions, concerns, or if new issues arise.   Roanna Banning, MD [facility]   Pager. [phone] Clinic. [phone]

## 2021-06-04 NOTE — Consult Note (Signed)
NAME:  Sharon [name], MRN:  [id number], DOB:  1991/04/26, LOS: 2 ADMISSION DATE:  06/02/2021, CONSULTATION DATE:  06/02/2021 REFERRING MD:  Dr. 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  Found to have abscess on Lt leg.  She had chest xray that showed large left pleural effusion.  CT chest showed area of cavitation in the inferior lingula and large Lt sided pleural effusion.  PCCM asked to assess for drainage of pleural effusion.  Pertinent  Medical History  Anxiety, OA, Carpal tunnel, IVDA, Headache  Significant Hospital Events: Including procedures, antibiotic start and stop dates in addition to other pertinent events   12/08 Admit, ortho consulted, start ABx, Lt pig tail catheter placed, start tPA/dornase  Studies:  CT angio chest 06/02/21 >> large complex Lt effusion with pleural enhancement with partial loculation, ATX LLL, cavitary area lingula, splenomegaly Lt pleural fluid 06/02/21 >> glucose less than 20, protein 5.4, LDH 4696, 10625 cells (92% neutrophils) MRI12/02/2021 1. Rim-enhancing abscess within the medial subcutaneous soft tissues at the level of the tibial plateau measuring 3.6 x 2.0 x 2.9 cm. 2. Mild diffuse intramuscular edema involving the anterior and deep posterior compartments of the lower leg with associated perifascial edema, compatible with myofasciitis. No evidence of myonecrosis or pyomyositis. 3. Mild tenosynovitis associated with the posteromedial ankle tendons, which may be reactive or infectious. 4. No evidence of osteomyelitis.    Interim History / Subjective:  Breathing better.  Still has pain in her legs, but better.  Feels hungry.  Objective   Blood pressure 119/75, pulse 77, temperature 97.9 F (36.6 C), temperature source Oral, resp. rate 20, height 5\' 3"  (1.6 m), weight 85.7 kg, SpO2 98 %.        Intake/Output Summary  (Last 24 hours) at 06/04/2021 1329 Last data filed at 06/04/2021 1129 Gross per 24 hour  Intake 1424.87 ml  Output 2220 ml  Net -795.13 ml   Filed Weights   06/02/21 0036 06/02/21 0456 06/02/21 1331  Weight: 89.8 kg 86.4 kg 85.7 kg    Examination:  General: Somnolent female no acute distress HEENT: MM pink/moist no JVD Neuro: Somewhat somnolent but arouses CV: Heart sounds are regular PULM: Decreased breath sounds left chest tube with 350 cc of drainage  GI: soft, bsx4 active  GU: Voids Extremities: warm/dry, 1-2+ edema lower extremity bandages noted Skin: no rashes or lesions   Resolved Hospital Problem list     Assessment & Plan:   Lt pleural space empyema with cavitary pneumonia in setting of IVDA and Leg abscess and cellulitis. Pigtail placed 06/02/2021 tPA placed 06/03/2021 with good drainage therefore we will evaluate daily and continue tPA as needed   Anxiety, hx of IVDA. Per primary team  Leg abscess, Rt lower leg cellulitis, Rt axillary stranding. Orthopedic clinic to intervene at this time Continue antimicrobial therapy Note MRI   Microcytic anemia. Recent Labs    06/03/21 1229 06/03/21 2340  HGB 6.4* 8.1*   Transfuse per protocol all through the deferring intervention at this time  Patient's boyfriend updated at bedside 06/04/2021   Best Practice (right click and "Reselect all SmartList Selections" daily)  For pulmonary  Labs    CMP Latest Ref Rng & Units 06/03/2021 06/03/2021 06/02/2021  Glucose 70 - 99 mg/dL 102(H) 102(H) 108(H)  BUN 6 - 20 mg/dL 10 8 10   Creatinine 0.44 -  1.00 mg/dL 7.51 0.25 8.52  Sodium 135 - 145 mmol/L 135 133(L) 131(L)  Potassium 3.5 - 5.1 mmol/L 4.4 4.2 3.2(L)  Chloride 98 - 111 mmol/L 105 101 95(L)  CO2 22 - 32 mmol/L 22 26 26   Calcium 8.9 - 10.3 mg/dL 7.6(L) 7.4(L) 7.8(L)  Total Protein 6.5 - 8.1 g/dL 6.2(L) - 8.4(H)  Total Bilirubin 0.3 - 1.2 mg/dL QUANTITY NOT SUFFICIENT, UNABLE TO PERFORM TEST - 1.8(H)   Alkaline Phos 38 - 126 U/L 69 - 106  AST 15 - 41 U/L 27 - 29  ALT 0 - 44 U/L QUANTITY NOT SUFFICIENT, UNABLE TO PERFORM TEST - 21    CBC Latest Ref Rng & Units 06/03/2021 06/03/2021 06/03/2021  WBC 4.0 - 10.5 K/uL 8.5 - 12.5(H)  Hemoglobin 12.0 - 15.0 g/dL 8.1(L) 6.4(LL) 7.1(L)  Hematocrit 36.0 - 46.0 % 26.1(L) 22.0(L) 23.5(L)  Platelets 150 - 400 K/uL 420(H) - 407(H)    Signature:  14/02/2021 Mckinlee Dunk ACNP Acute Care Nurse Practitioner [facility] Pulmonary/Critical Care Please consult Amion 06/04/2021, 1:29 PM

## 2021-06-04 NOTE — Progress Notes (Signed)
Orthopaedics Daily Progress Note   06/04/2021   2:41 PM  Sharon [name] is a 30 y.o. female 2 Days Post-Op s/p CHEST TUBE INSERTION  Subjective Denies nausea, vomiting, or fevers.  Still able to range right ankle and bear weight.  Objective Vitals:   06/04/21 1008 06/04/21 1123  BP: 100/62 119/75  Pulse:  77  Resp:  20  Temp: 97.9 F (36.6 C) 97.9 F (36.6 C)  SpO2:  98%    Intake/Output Summary (Last 24 hours) at 06/04/2021 1441 Last data filed at 06/04/2021 1129 Gross per 24 hour  Intake 1184.87 ml  Output 1820 ml  Net -635.13 ml    Physical Exam RLE: Erythema consistent with cellulitis extending over anterior aspect of leg and ankle +DF/PF/EHL SILT SP/DP/T +DP/PT and WWP distally  Independently reviewed and interpreted MRI of the right lower extremity, as well as the report.  Agree with finding multiple soft tissue abscesses.  Assessment 30 y.o. female s/p Procedure(s) (LRB): CHEST TUBE INSERTION (N/A)  Plan Had long discussion with the patient again about repeating the aspiration, but she adamantly wishes to avoid another aspiration of the ankle at this time.  Clinically suspicion for septic right ankle joint is low, given her ability to bear weight and range the ankle.  She does also seem to have good response to antibiotics so far.  In the absence of bony abscess or septic joint, would not recommend any orthopedic intervention at this time.  Consider engaging interventional radiology for possible image guided localization and percutaneous drainage of the larger soft tissue abscesses.  Plan of care discussed with patient at bedside.  All questions were answered.  Continue medical treatment and care per primary team.   Ernestina Columbia M.D. Orthopaedic Surgery [facility]

## 2021-06-04 NOTE — Progress Notes (Signed)
Initial Nutrition Assessment  DOCUMENTATION CODES:   Obesity unspecified  INTERVENTION:   - Ensure Enlive po BID, each supplement provides 350 kcal and 20 grams of protein  - MVI with minerals daily  - Encourage PO intake  NUTRITION DIAGNOSIS:   Increased nutrient needs related to acute illness (sepsis secondary to necrotizing pneumonia) as evidenced by estimated needs.  GOAL:   Patient will meet greater than or equal to 90% of their needs  MONITOR:   PO intake, Supplement acceptance, Labs, Weight trends, Skin  REASON FOR ASSESSMENT:   Consult Assessment of nutrition requirement/status  ASSESSMENT:   30 year old female who presented to the ED on 12/08 with abscess to LLE x 4 days, R ankle edema, fever with cough. PMH of IVDA, anxiety disorder. Pt admitted with sepsis secondary to LLL necrotizing pneumonia, concern for septic arthritis to R ankle, and empyema.  12/08 - s/p insertion of chest tube  When a patient presents with low albumin, it is likely skewed due to the acute inflammatory response. Unless it is suspected that patient had poor PO intake or malnutrition prior to admission, then RD should not be consulted solely for low albumin. Note that low albumin is no longer used to diagnose malnutrition; [facility] uses the new malnutrition guidelines published by the American Society for Parenteral and Enteral Nutrition (ASPEN) and the Academy of Nutrition and Dietetics (AND).   RD was unable to reach pt via phone call to room. Reviewed available weight history in chart. Last available weight PTA is from 05/28/19. Pt with a total weight loss of 6.4 kg since that date. This is a 6.9% weight loss in 2 years which is not clinically significant for timeframe. However, unsure if weight loss may have occurred more acutely. Also, suspect pt's dry weight is lower than current charted weight given nursing documentation of non-pitting edema to BLE.  RD will order oral nutrition  supplement to aid pt in meeting increased kcal and protein needs related to acute illness. Will also order daily MVI with minerals.  Admit weight: 86.4 kg Current weight: 85.7 kg  Per nursing documentation, pt with non-pitting edema to BLE.  Meal Completion: 100% x 2 meals on 12/09, 0% x 1 meal today  Medications reviewed and include: IV abx  Labs reviewed: iron 16, hemoglobin 8.1  UOP: 1100 ml x 24 hours Chest tube: 420 ml x 24 hours I/O's: +56 ml since admit  NUTRITION - FOCUSED PHYSICAL EXAM:  Unable to complete at this time. RD working remotely.  Diet Order:   Diet Order             Diet regular Room service appropriate? Yes; Fluid consistency: Thin  Diet effective now                   EDUCATION NEEDS:   No education needs have been identified at this time  Skin:  Skin Assessment: Skin Integrity Issues: Other: open wound to LLE  Last BM:  05/30/21  Height:   Ht Readings from Last 1 Encounters:  06/02/21 5\' 3"  (1.6 m)    Weight:   Wt Readings from Last 1 Encounters:  06/02/21 85.7 kg    BMI:  Body mass index is 33.47 kg/m.  Estimated Nutritional Needs:   Kcal:  1800-2000  Protein:  85-100 grams  Fluid:  1.8-2.0 L    14/08/22, MS, RD, LDN Inpatient Clinical Dietitian Please see AMiON for contact information.

## 2021-06-04 NOTE — Progress Notes (Signed)
PROGRESS NOTE  Sharon [name]  MVH:[id number] DOB: 1991/04/09 DOA: 06/02/2021 PCP: Patient, No Pcp Per (Inactive)   Brief Narrative: Sharon [name] is a 30 y.o. female with a history of IVDU, tobacco use, anxiety who presented to the ED on 12/8 with infected wound of the left leg, painful swelling of the right ankle, and fever with cough. She was tachycardic with borderline hypotension, given IVF. WBC 20k. CTA chest revealed cavitary pneumonia with associated loculated left pleural effusion. PCCM was consulted, placed pigtail catheter and is proceeding with fibrinolytic. Right ankle was tapped with no fluid return in ED. Swelling has improved with IV antibiotics alone, so orthopedics not currently recommending surgical management. Blood culture and pleural fluid culture are pending, echocardiogram ordered. ID guiding antibiotics, changed now to vancomycin and cefepime.  Assessment & Plan: Principal Problem:   Sepsis (HCC) Active Problems:   IV drug abuse (HCC)   Substance abuse (HCC)   Empyema of pleura (HCC)  Sepsis due to multiple infections, concern for bacteremia in IVDU:  - Continue vancomycin, cefepime, can stop metronidazole per ID.  - Monitor blood cultures (NGTD x2 days on admission, and 2 sets NGTD <24 hrs) - Vitals stabilizing. Can stop IVF  - Echocardiogram without vegetations.  Cavitary lingula pneumonia and empyema:  - Appreciate PCCM management s/p chest tube 12/8, instilling lytics. - F/u pleural fluid culture (pending)  Right ankle cellulitis: Possible septic arthritis, though felt less likely with significant improvement and dry tap in ED. - MR ordered for further investigation, pending radiologist interpretation. Appreciate orthopedics recommendations. - LE venous U/S negative for DVT  Right axillary stranding: Unclear significance. No complaints from patient, though there is some AC erythema and pt injects in UE's.   - MR right arm when able.   Left calf  abscess: Spontaneously draining.  - Continue local wound care and abx as above.  IVDU, narcotic use:  - Monitor for withdrawal.  - Clonidine prn - Cessation counseling provided - HIV NR, hep B Ab+, Ag - (immune), other infectious work up per ID  Tobacco use:  - Cessation counseling - Nicotine patch  Microcytic anemia: Exacerbated by IVF causing hemodilution. No active bleeding noted.  - Hgb improved with transfusion 1u PRBC 12/9. Continue monitoring.   Hypoalbuminemia: Suspected malnutrition.  - Dietitian consulted.   Hypokalemia: Resolved  Anxiety:  - Xanax prn  Obesity: Estimated body mass index is 33.47 kg/m as calculated from the following:   Height as of this encounter: 5\' 3"  (1.6 m).   Weight as of this encounter: 85.7 kg.  DVT prophylaxis: Lovenox Code Status: Full Family Communication: Boyfriend at bedside this AM Disposition Plan:  Status is: Inpatient  Remains inpatient appropriate because: continued work up of multiple infections, requiring IV abx.  Consultants:  PCCM Orthopedics Infectious diseases  Procedures:  Left pigtail catheter/chest tube placement 06/02/2021  Antimicrobials: Vancomycin cefepime >>  Flagyl 12/8 - 12/9   Subjective: No new complaints, pain improving. Would not lift cover off face for interview or exam.  Objective: Vitals:   06/03/21 1648 06/03/21 1941 06/04/21 0010 06/04/21 0407  BP: (!) 98/57 116/79 117/68 133/79  Pulse: 99 81 94 (!) 105  Resp: 20 18 18 20   Temp: 99 F (37.2 C) 98 F (36.7 C) 98.3 F (36.8 C) 98.2 F (36.8 C)  TempSrc: Oral Oral Oral Oral  SpO2: 100% 100% 96% 97%  Weight:      Height:        Intake/Output Summary (Last 24  hours) at 06/04/2021 0931 Last data filed at 06/04/2021 0407 Gross per 24 hour  Intake 1304.87 ml  Output 1520 ml  Net -215.13 ml   Filed Weights   06/02/21 0036 06/02/21 0456 06/02/21 1331  Weight: 89.8 kg 86.4 kg 85.7 kg   Gen: 30 y.o. female in no distress Pulm:  Nonlabored breathing room air. Clear laterally. CV: Regular rate and rhythm on monitor. Trace R > L pitting edema GI: Not examined due to pt cooperation. Ext: Warm, dry. R ankle edematous with improvement, no palpable deformity. Skin: No new rashes, lesions or ulcers on visualized skin. RUE with some erythema at [facility], left calf dressing c/d/i Neuro: Alert and oriented. Not cooperative with exam, speech without dysarthria. Psych: Calm, not cooperative.  Data Reviewed: I have personally reviewed following labs and imaging studies  CBC: Recent Labs  Lab 06/02/21 0221 06/03/21 0152 06/03/21 1229 06/03/21 2340  WBC 20.5* 12.5*  --  8.5  NEUTROABS 15.4*  --   --  5.4  HGB 8.7* 7.1* 6.4* 8.1*  HCT 28.8* 23.5* 22.0* 26.1*  MCV 74.2* 75.1*  --  75.4*  PLT 489* 407*  --  420*   Basic Metabolic Panel: Recent Labs  Lab 06/02/21 0221 06/03/21 0152 06/03/21 2340  NA 131* 133* 135  K 3.2* 4.2 4.4  CL 95* 101 105  CO2 GLUCOSE 108* 102* 102*  BUN CREATININE 0.69 0.56 0.49  CALCIUM 7.8* 7.4* 7.6*  MG  --  1.7  --    GFR: Estimated Creatinine Clearance: 106.6 mL/min (by C-G formula based on SCr of 0.49 mg/dL). Liver Function Tests: Recent Labs  Lab 06/02/21 0221 06/03/21 2340  AST 29 27  ALT 21 QUANTITY NOT SUFFICIENT, UNABLE TO PERFORM TEST  ALKPHOS 106 69  BILITOT 1.8* QUANTITY NOT SUFFICIENT, UNABLE TO PERFORM TEST  PROT 8.4* 6.2*  ALBUMIN 2.0* <1.5*   No results for input(s): LIPASE, AMYLASE in the last 168 hours. No results for input(s): AMMONIA in the last 168 hours. Coagulation Profile: Recent Labs  Lab 06/02/21 0450  INR 1.2   Cardiac Enzymes: No results for input(s): CKTOTAL, CKMB, CKMBINDEX, TROPONINI in the last 168 hours. BNP (last 3 results) No results for input(s): PROBNP in the last 8760 hours. HbA1C: No results for input(s): HGBA1C in the last 72 hours. CBG: No results for input(s): GLUCAP in the last 168 hours. Lipid Profile: No  results for input(s): CHOL, HDL, LDLCALC, TRIG, CHOLHDL, LDLDIRECT in the last 72 hours. Thyroid Function Tests: No results for input(s): TSH, T4TOTAL, FREET4, T3FREE, THYROIDAB in the last 72 hours. Anemia Panel: Recent Labs    06/03/21 0152  FERRITIN 108  TIBC 199*  IRON 16*  RETICCTPCT 2.9   Urine analysis:    Component Value Date/Time   COLORURINE AMBER (A) 06/02/2021 0745   APPEARANCEUR HAZY (A) 06/02/2021 0745   LABSPEC 1.020 06/02/2021 0745   PHURINE 5.5 06/02/2021 0745   GLUCOSEU NEGATIVE 06/02/2021 0745   HGBUR TRACE (A) 06/02/2021 0745   BILIRUBINUR MODERATE (A) 06/02/2021 0745   KETONESUR NEGATIVE 06/02/2021 0745   PROTEINUR 30 (A) 06/02/2021 0745   NITRITE POSITIVE (A) 06/02/2021 0745   LEUKOCYTESUR NEGATIVE 06/02/2021 0745   Recent Results (from the past 240 hour(s))  Resp Panel by RT-PCR (Flu A&B, Covid) Nasopharyngeal Swab     Status: None   Collection Time: 06/02/21  4:50 AM   Specimen: Nasopharyngeal Swab; Nasopharyngeal(NP) swabs in vial transport medium  Result  Value Ref Range Status   SARS Coronavirus 2 by RT PCR NEGATIVE NEGATIVE Final    Comment: (NOTE) SARS-CoV-2 target nucleic acids are NOT DETECTED.  The SARS-CoV-2 RNA is generally detectable in upper respiratory specimens during the acute phase of infection. The lowest concentration of SARS-CoV-2 viral copies this assay can detect is 138 copies/mL. A negative result does not preclude SARS-Cov-2 infection and should not be used as the sole basis for treatment or other patient management decisions. A negative result may occur with  improper specimen collection/handling, submission of specimen other than nasopharyngeal swab, presence of viral mutation(s) within the areas targeted by this assay, and inadequate number of viral copies(<138 copies/mL). A negative result must be combined with clinical observations, patient history, and epidemiological information. The expected result is  Negative.  Fact Sheet for Patients:  BloggerCourse.com  Fact Sheet for Healthcare Providers:  SeriousBroker.it  This test is no t yet approved or cleared by the [place] FDA and  has been authorized for detection and/or diagnosis of SARS-CoV-2 by FDA under an Emergency Use Authorization (EUA). This EUA will remain  in effect (meaning this test can be used) for the duration of the COVID-19 declaration under Section 564(b)(1) of the Act, 21 U.S.C.section 360bbb-3(b)(1), unless the authorization is terminated  or revoked sooner.       Influenza A by PCR NEGATIVE NEGATIVE Final   Influenza B by PCR NEGATIVE NEGATIVE Final    Comment: (NOTE) The Xpert Xpress SARS-CoV-2/FLU/RSV plus assay is intended as an aid in the diagnosis of influenza from Nasopharyngeal swab specimens and should not be used as a sole basis for treatment. Nasal washings and aspirates are unacceptable for Xpert Xpress SARS-CoV-2/FLU/RSV testing.  Fact Sheet for Patients: BloggerCourse.com  Fact Sheet for Healthcare Providers: SeriousBroker.it  This test is not yet approved or cleared by the [place] FDA and has been authorized for detection and/or diagnosis of SARS-CoV-2 by FDA under an Emergency Use Authorization (EUA). This EUA will remain in effect (meaning this test can be used) for the duration of the COVID-19 declaration under Section 564(b)(1) of the Act, 21 U.S.C. section 360bbb-3(b)(1), unless the authorization is terminated or revoked.  Performed at [facility], [address] [place], [place] [address]   Blood Culture (routine x 2)     Status: None (Preliminary result)   Collection Time: 06/02/21  4:50 AM   Specimen: Left Antecubital; Blood  Result Value Ref Range Status   Specimen Description   Final    LEFT ANTECUBITAL Performed at [facility], [address] [place], [place] [address]    Special Requests   Final    BOTTLES DRAWN AEROBIC AND ANAEROBIC Blood Culture adequate volume Performed at [facility], [address] [place], [place] [address]    Culture   Final    NO GROWTH 2 DAYS Performed at [facility], [address] [place], [place] [address]    Report Status PENDING  Incomplete  Urine Culture     Status: Abnormal   Collection Time: 06/02/21  7:45 AM   Specimen: In/Out Cath Urine  Result Value Ref Range Status   Specimen Description   Final    IN/OUT CATH URINE Performed at [facility], [address] [place], [place] [address]    Special Requests   Final    NONE Performed at [facility], [address] [place],  [place] [address]    Culture MULTIPLE SPECIES PRESENT, SUGGEST RECOLLECTION (A)  Final   Report Status 06/03/2021 FINAL  Final  Culture, blood (Routine X 2) w Reflex to ID Panel     Status: None (Preliminary result)   Collection Time: 06/03/21 12:21 PM   Specimen: BLOOD LEFT HAND  Result Value Ref Range Status   Specimen Description BLOOD LEFT HAND  Final   Special Requests   Final    BOTTLES DRAWN AEROBIC AND ANAEROBIC Blood Culture results may not be optimal due to an inadequate volume of blood received in culture bottles   Culture   Final    NO GROWTH < 24 HOURS Performed at [facility], [address], [place], [place] [address]    Report Status PENDING  Incomplete  Culture, blood (Routine X 2) w Reflex to ID Panel     Status: None (Preliminary result)   Collection Time: 06/03/21 12:31 PM   Specimen: BLOOD LEFT HAND  Result Value Ref Range Status   Specimen Description BLOOD LEFT HAND  Final   Special Requests   Final    BOTTLES DRAWN AEROBIC ONLY Blood Culture results may not be optimal due to an inadequate volume of blood received in culture bottles   Culture   Final    NO GROWTH < 24 HOURS Performed at [facility], [address],  [place], [place] [address]    Report Status PENDING  Incomplete      Radiology Studies: DG Chest Port 1 View  Result Date: 06/02/2021 CLINICAL DATA:  Status post chest tube placement. EXAM: PORTABLE CHEST 1 VIEW COMPARISON:  Same day chest radiograph at 0415 hours and same day chest CT. FINDINGS: Interval placement of a left basilar pigtail thoracostomy tube. Decreased size of the left-sided empyema with increased expansion of the left lung. Left basilar airspace consolidation. The heart size and mediastinal contours are unchanged. The visualized skeletal structures are unchanged. IMPRESSION: Interval placement of left basilar pigtail thoracostomy tube with decreased size of the left-sided empyema. Electronically Signed   By: Maudry Mayhew M.D.   On: 06/02/2021 15:54   ECHOCARDIOGRAM COMPLETE  Result Date: 06/03/2021    ECHOCARDIOGRAM REPORT   Patient Name:   Sharon [name] Duma Date of Exam: 06/03/2021 Medical Rec #:  [id number]        Height:       63.0 in Accession #:    [id number]       Weight:       188.9 lb Date of Birth:  Jun 14, 1991         BSA:          1.887 m Patient Age:    30 years         BP:           101/60 mmHg Patient Gender: F                HR:           92 bpm. Exam Location:  Inpatient Procedure: 2D Echo, Cardiac Doppler and Color Doppler Indications:    Endocarditis  History:        Patient has no prior history of Echocardiogram examinations.  Sonographer:    Cleatis Polka Referring Phys: [id number] PING T ZHANG IMPRESSIONS  1. Left ventricular ejection fraction, by estimation, is 60 to 65%. The left ventricle has normal function. The left ventricle has no regional wall motion abnormalities. Left ventricular diastolic parameters were normal.  2. Right ventricular systolic function is normal. The right ventricular size is normal. Tricuspid regurgitation signal is inadequate for assessing PA pressure.  3. Mild thickening of the mitral valve. No evidence of mitral valve regurgitation.  4. The aortic  valve is grossly normal. Aortic valve regurgitation is not visualized.  5. The inferior vena cava is dilated in size with >50% respiratory variability, suggesting right atrial pressure of 8 mmHg. Comparison(s): No prior Echocardiogram. Conclusion(s)/Recommendation(s): Otherwise normal echocardiogram, with minor abnormalities described in the report. FINDINGS  Left Ventricle: Left ventricular ejection fraction, by estimation, is 60 to 65%. The left ventricle has normal function. The left ventricle has no regional wall motion abnormalities. The left ventricular internal cavity size was normal in size. There is  no left ventricular hypertrophy. Left ventricular diastolic parameters were normal. Right Ventricle: The right ventricular size is normal. No increase in right ventricular wall thickness. Right ventricular systolic function is normal. Tricuspid regurgitation signal is inadequate for assessing PA pressure. Left Atrium: Left atrial size was normal in size. Right Atrium: Right atrial size was normal in size. Pericardium: There is no evidence of pericardial effusion. Mitral Valve: Mild thickening of the mitral valve. No evidence of mitral valve regurgitation. Tricuspid Valve: The tricuspid valve is normal in structure. Tricuspid valve regurgitation is not demonstrated. Aortic Valve: The aortic valve is grossly normal. Aortic valve regurgitation is not visualized. Aortic valve peak gradient measures 5.2 mmHg. Pulmonic Valve: The pulmonic valve was grossly normal. Pulmonic valve regurgitation is not visualized. Aorta: The aortic root and ascending aorta are structurally normal, with no evidence of dilitation. Venous: The inferior vena cava is dilated in size with greater than 50% respiratory variability, suggesting right atrial pressure of 8 mmHg. IAS/Shunts: No atrial level shunt detected by color flow Doppler.  LEFT VENTRICLE PLAX 2D LVIDd:         4.70 cm     Diastology LVIDs:         3.00 cm     LV e' medial:     9.68 cm/s LV PW:         0.90 cm     LV E/e' medial:  7.8 LV IVS:        1.00 cm     LV e' lateral:   9.46 cm/s LVOT diam:     2.00 cm     LV E/e' lateral: 8.0 LV SV:         50 LV SV Index:   26 LVOT Area:     3.14 cm  LV Volumes (MOD) LV vol d, MOD A2C: 65.6 ml LV vol d, MOD A4C: 84.0 ml LV vol s, MOD A2C: 27.4 ml LV vol s, MOD A4C: 35.9 ml LV SV MOD A2C:     38.2 ml LV SV MOD A4C:     84.0 ml LV SV MOD BP:      44.8 ml RIGHT VENTRICLE             IVC RV Basal diam:  3.60 cm     IVC diam: 2.20 cm RV Mid diam:    2.40 cm RV S prime:     10.10 cm/s TAPSE (M-mode): 2.0 cm LEFT ATRIUM             Index        RIGHT ATRIUM           Index LA diam:        3.80 cm 2.01 cm/m   RA Area:  12.40 cm LA Vol (A2C):   29.0 ml 15.36 ml/m  RA Volume:   31.10 ml  16.48 ml/m LA Vol (A4C):   38.5 ml 20.40 ml/m LA Biplane Vol: 36.0 ml 19.07 ml/m  AORTIC VALVE AV Area (Vmax): 2.32 cm AV Vmax:        114.00 cm/s AV Peak Grad:   5.2 mmHg LVOT Vmax:      84.20 cm/s LVOT Vmean:     60.100 cm/s LVOT VTI:       0.158 m  AORTA Ao Root diam: 2.70 cm Ao Asc diam:  2.40 cm MITRAL VALVE MV Area (PHT): 6.12 cm    SHUNTS MV Decel Time: 124 msec    Systemic VTI:  0.16 m MV E velocity: 75.40 cm/s  Systemic Diam: 2.00 cm MV A velocity: 66.80 cm/s MV E/A ratio:  1.13 Photographer signed by Carolan Clines Signature Date/Time: 06/03/2021/3:35:49 PM    Final    VAS [place] LOWER EXTREMITY VENOUS (DVT)  Result Date: 06/03/2021  Lower Venous DVT Study Patient Name:  Sharon [name]  Date of Exam:   06/03/2021 Medical Rec #: [id number]         Accession #:    [id number] Date of Birth: [birth year]          Patient Gender: F Patient Age:   30 years Exam Location:  [facility] Procedure:      VAS [place] LOWER EXTREMITY VENOUS (DVT) Referring Phys: Mikey College --------------------------------------------------------------------------------  Indications: RT ankle swelling, IV drug user.  Comparison Study: No prior studies. Performing  Technologist: Jean Rosenthal RDMS, RVT  Examination Guidelines: A complete evaluation includes B-mode imaging, spectral Doppler, color Doppler, and power Doppler as needed of all accessible portions of each vessel. Bilateral testing is considered an integral part of a complete examination. Limited examinations for reoccurring indications may be performed as noted. The reflux portion of the exam is performed with the patient in reverse Trendelenburg.  +---------+---------------+---------+-----------+----------+--------------+ RIGHT    CompressibilityPhasicitySpontaneityPropertiesThrombus Aging +---------+---------------+---------+-----------+----------+--------------+ CFV      Full           Yes      Yes                                 +---------+---------------+---------+-----------+----------+--------------+ SFJ      Full                                                        +---------+---------------+---------+-----------+----------+--------------+ FV Prox  Full                                                        +---------+---------------+---------+-----------+----------+--------------+ FV Mid   Full                                                        +---------+---------------+---------+-----------+----------+--------------+ FV DistalFull                                                        +---------+---------------+---------+-----------+----------+--------------+  PFV      Full                                                        +---------+---------------+---------+-----------+----------+--------------+ POP      Full           Yes      Yes                                 +---------+---------------+---------+-----------+----------+--------------+ PTV      Full                                                        +---------+---------------+---------+-----------+----------+--------------+ PERO     Full                                                         +---------+---------------+---------+-----------+----------+--------------+ Gastroc  Full                                                        +---------+---------------+---------+-----------+----------+--------------+   +----+---------------+---------+-----------+----------+--------------+ LEFTCompressibilityPhasicitySpontaneityPropertiesThrombus Aging +----+---------------+---------+-----------+----------+--------------+ CFV Full           Yes      Yes                                 +----+---------------+---------+-----------+----------+--------------+     Summary: RIGHT: - There is no evidence of deep vein thrombosis in the lower extremity.  - No cystic structure found in the popliteal fossa.  LEFT: - No evidence of common femoral vein obstruction.  *See table(s) above for measurements and observations.    Preliminary     Scheduled Meds:  acidophilus  2 capsule Oral Daily   enoxaparin (LOVENOX) injection  40 mg Subcutaneous Q24H   guaiFENesin  1,200 mg Oral BID   nicotine  21 mg Transdermal Daily   sodium chloride flush  10 mL Other Q8H   Continuous Infusions:  ceFEPime (MAXIPIME) IV 2 g (06/04/21 0414)   vancomycin 1,000 mg (06/03/21 2312)     LOS: 2 days   Time spent: 35 minutes.  Tyrone Nine, MD [facility] [email or web address] 06/04/2021, 9:31 AM

## 2021-06-05 ENCOUNTER — Encounter (HOSPITAL_COMMUNITY): Payer: Self-pay | Admitting: Pulmonary Disease

## 2021-06-05 ENCOUNTER — Inpatient Hospital Stay (HOSPITAL_COMMUNITY): Payer: Medicaid Other

## 2021-06-05 DIAGNOSIS — A419 Sepsis, unspecified organism: Secondary | ICD-10-CM | POA: Diagnosis not present

## 2021-06-05 LAB — CBC WITH DIFFERENTIAL/PLATELET
Abs Immature Granulocytes: 0.03 10*3/uL (ref 0.00–0.07)
Basophils Absolute: 0 10*3/uL (ref 0.0–0.1)
Basophils Relative: 0 %
Eosinophils Absolute: 0.1 10*3/uL (ref 0.0–0.5)
Eosinophils Relative: 2 %
HCT: 28 % — ABNORMAL LOW (ref 36.0–46.0)
Hemoglobin: 8.5 g/dL — ABNORMAL LOW (ref 12.0–15.0)
Immature Granulocytes: 0 %
Lymphocytes Relative: 23 %
Lymphs Abs: 1.6 10*3/uL (ref 0.7–4.0)
MCH: 23.4 pg — ABNORMAL LOW (ref 26.0–34.0)
MCHC: 30.4 g/dL (ref 30.0–36.0)
MCV: 77.1 fL — ABNORMAL LOW (ref 80.0–100.0)
Monocytes Absolute: 0.4 10*3/uL (ref 0.1–1.0)
Monocytes Relative: 6 %
Neutro Abs: 4.7 10*3/uL (ref 1.7–7.7)
Neutrophils Relative %: 69 %
Platelets: 492 10*3/uL — ABNORMAL HIGH (ref 150–400)
RBC: 3.63 MIL/uL — ABNORMAL LOW (ref 3.87–5.11)
RDW: 20.6 % — ABNORMAL HIGH (ref 11.5–15.5)
WBC: 6.9 10*3/uL (ref 4.0–10.5)
nRBC: 0 % (ref 0.0–0.2)

## 2021-06-05 LAB — SURGICAL PCR SCREEN
MRSA, PCR: NEGATIVE
Staphylococcus aureus: NEGATIVE

## 2021-06-05 LAB — CBC
HCT: 27.4 % — ABNORMAL LOW (ref 36.0–46.0)
Hemoglobin: 8.2 g/dL — ABNORMAL LOW (ref 12.0–15.0)
MCH: 23.6 pg — ABNORMAL LOW (ref 26.0–34.0)
MCHC: 29.9 g/dL — ABNORMAL LOW (ref 30.0–36.0)
MCV: 79 fL — ABNORMAL LOW (ref 80.0–100.0)
Platelets: 413 10*3/uL — ABNORMAL HIGH (ref 150–400)
RBC: 3.47 MIL/uL — ABNORMAL LOW (ref 3.87–5.11)
RDW: 21.1 % — ABNORMAL HIGH (ref 11.5–15.5)
WBC: 7 10*3/uL (ref 4.0–10.5)
nRBC: 0 % (ref 0.0–0.2)

## 2021-06-05 LAB — BASIC METABOLIC PANEL
Anion gap: 8 (ref 5–15)
Anion gap: 4 — ABNORMAL LOW (ref 5–15)
BUN: 6 mg/dL (ref 6–20)
BUN: 10 mg/dL (ref 6–20)
CO2: 24 mmol/L (ref 22–32)
CO2: 24 mmol/L (ref 22–32)
Calcium: 8 mg/dL — ABNORMAL LOW (ref 8.9–10.3)
Calcium: 7.6 mg/dL — ABNORMAL LOW (ref 8.9–10.3)
Chloride: 107 mmol/L (ref 98–111)
Chloride: 106 mmol/L (ref 98–111)
Creatinine, Ser: 0.61 mg/dL (ref 0.44–1.00)
Creatinine, Ser: 0.47 mg/dL (ref 0.44–1.00)
GFR, Estimated: >60 mL/min (ref >60)
GFR, Estimated: >60 mL/min (ref >60)
Glucose, Bld: 120 mg/dL — ABNORMAL HIGH (ref 70–99)
Glucose, Bld: 135 mg/dL — ABNORMAL HIGH (ref 70–99)
Potassium: 3.7 mmol/L (ref 3.5–5.1)
Potassium: 3.9 mmol/L (ref 3.5–5.1)
Sodium: 139 mmol/L (ref 135–145)
Sodium: 134 mmol/L — ABNORMAL LOW (ref 135–145)

## 2021-06-05 LAB — PHOSPHORUS
Phosphorus: 3.1 mg/dL (ref 2.5–4.6)
Phosphorus: 3.8 mg/dL (ref 2.5–4.6)

## 2021-06-05 LAB — MAGNESIUM
Magnesium: 2 mg/dL (ref 1.7–2.4)
Magnesium: 1.8 mg/dL (ref 1.7–2.4)

## 2021-06-05 IMAGING — DX DG CHEST 1V PORT
1 series · 1 of 1 positions shown · non-contrast
Comparison: Prior chest x-ray [DATE]

CLINICAL DATA: 30-year-old female with shortness of breath, chest
pain

EXAM:
PORTABLE CHEST 1 VIEW

[chest]
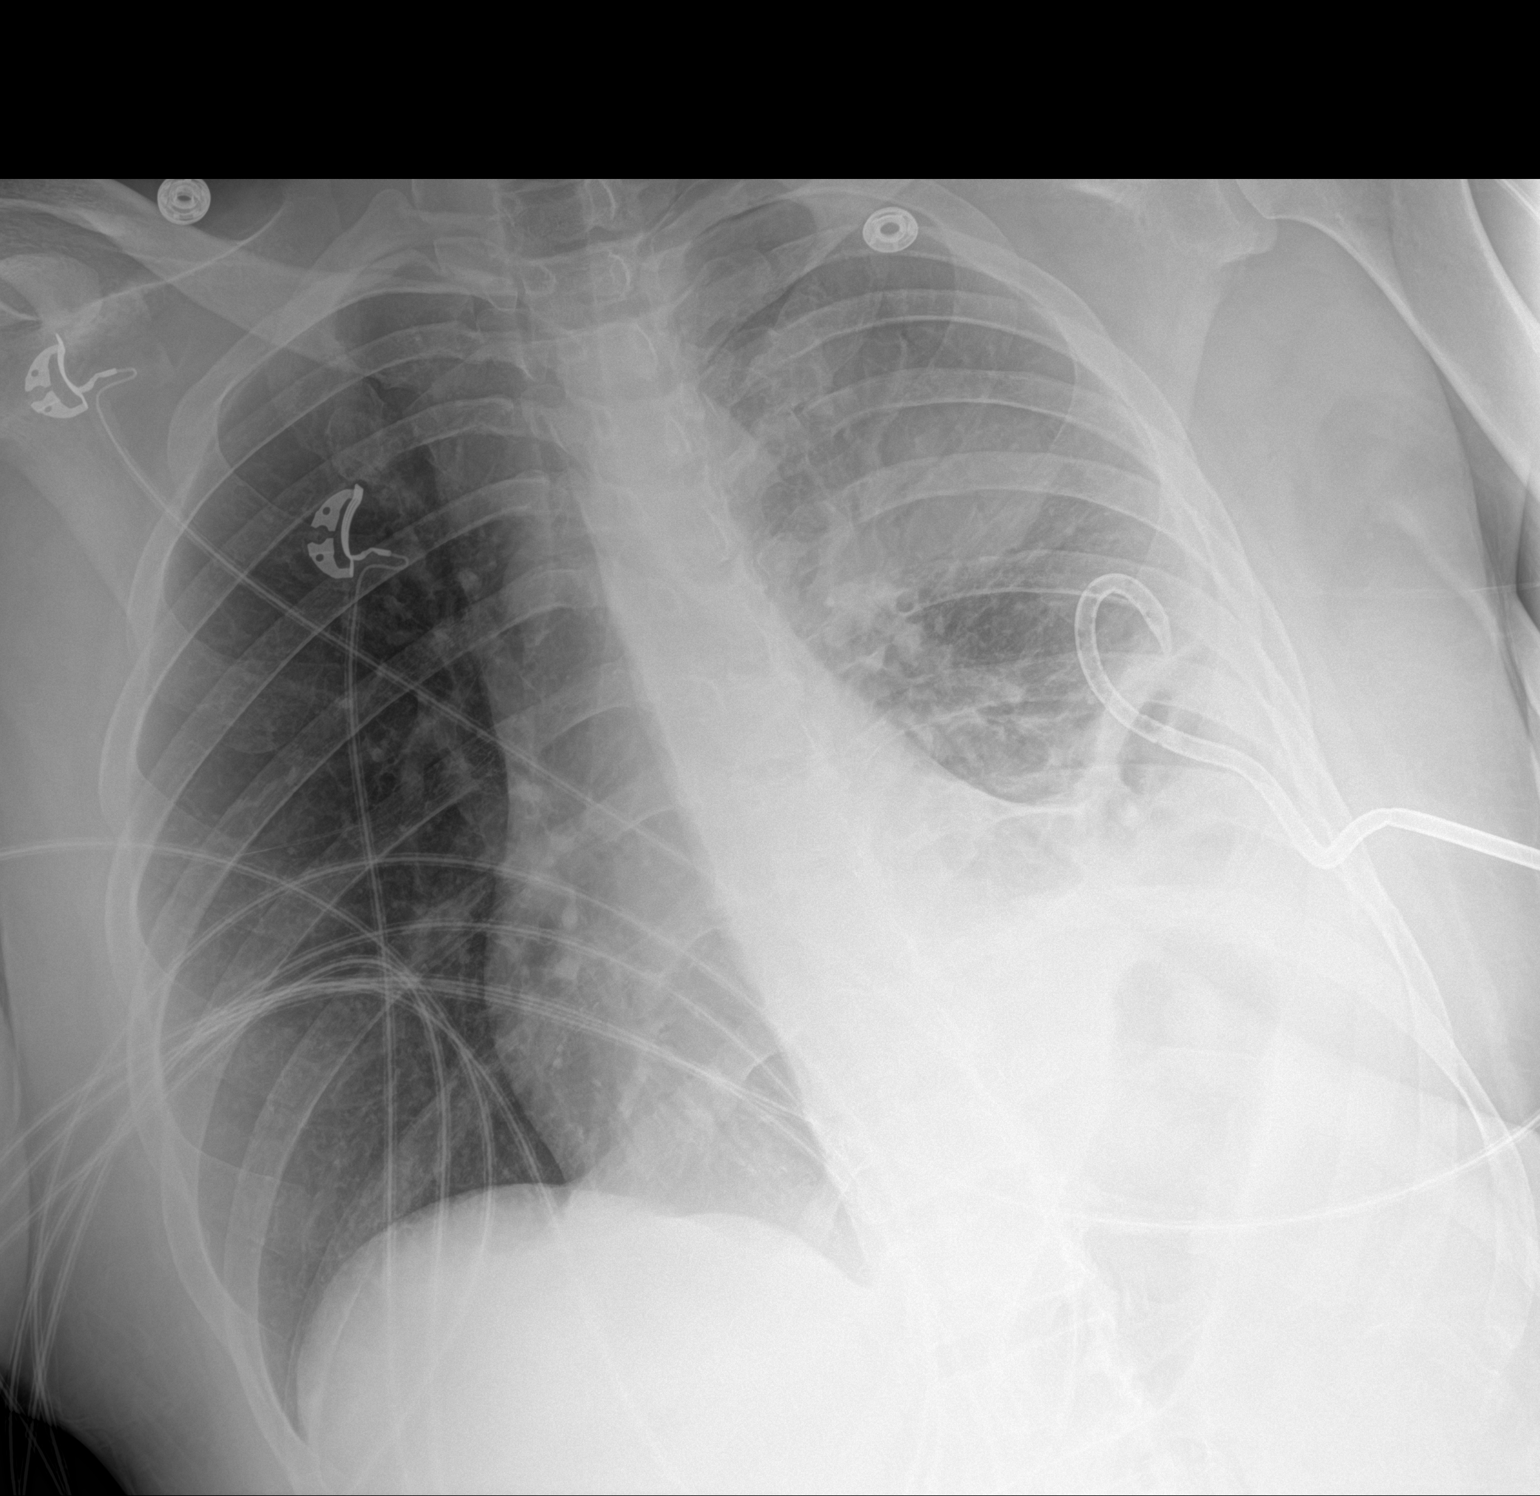

[1 of 1 positions shown; findings below may reference images not displayed]

FINDINGS: Left-sided pigtail thoracostomy tube in unchanged position.
Persistent loculated fluid within the fissure and the lateral
periphery of the lung. The right lung remains clear. Cardiac and
mediastinal contours are unchanged. No pneumothorax. No acute
osseous abnormality.
IMPRESSION: Stable position of left-sided pigtail thoracostomy tube with
residual loculated effusion. Some fluid is trapped within the
fissure.

Right lung remains clear.  No new or acute abnormality.

## 2021-06-05 MED ORDER — ALPRAZOLAM 0.5 MG PO TABS
0.5000 mg | ORAL_TABLET | Freq: Three times a day (TID) | ORAL | Status: DC | PRN
  Administered 2021-06-05 – 2021-06-10 (×13): 0.5 mg via ORAL
  Filled 2021-06-05 (×14): qty 1

## 2021-06-05 MED ORDER — SODIUM CHLORIDE (PF) 0.9 % IJ SOLN
10.0000 mg | Freq: Once | INTRAMUSCULAR | Status: AC
  Administered 2021-06-05: 10 mg via INTRAPLEURAL
  Filled 2021-06-05: qty 10

## 2021-06-05 MED ORDER — SODIUM CHLORIDE 0.9% FLUSH
10.0000 mL | Freq: Three times a day (TID) | INTRAVENOUS | Status: DC
  Administered 2021-06-05 – 2021-06-09 (×8): 10 mL

## 2021-06-05 MED ORDER — POVIDONE-IODINE 10 % EX SWAB
2.0000 "application " | Freq: Once | CUTANEOUS | Status: AC
  Administered 2021-06-06: 2 via TOPICAL

## 2021-06-05 MED ORDER — STERILE WATER FOR INJECTION IJ SOLN
5.0000 mg | Freq: Once | RESPIRATORY_TRACT | Status: AC
  Administered 2021-06-05: 5 mg via INTRAPLEURAL
  Filled 2021-06-05: qty 5

## 2021-06-05 MED ORDER — CHLORHEXIDINE GLUCONATE 4 % EX LIQD
60.0000 mL | Freq: Once | CUTANEOUS | Status: AC
  Administered 2021-06-06: 4 via TOPICAL
  Filled 2021-06-05: qty 60

## 2021-06-05 NOTE — Progress Notes (Signed)
NAME:  Sharon [name], MRN:  [id number], DOB:  05-Jul-1990, LOS: 3 ADMISSION DATE:  06/02/2021, CONSULTATION DATE:  06/02/2021 REFERRING MD:  Dr. 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  Found to have abscess on Lt leg.  She had chest xray that showed large left pleural effusion.  CT chest showed area of cavitation in the inferior lingula and large Lt sided pleural effusion.  PCCM asked to assess for drainage of pleural effusion.  Pertinent  Medical History  Anxiety, OA, Carpal tunnel, IVDA, Headache  Significant Hospital Events: Including procedures, antibiotic start and stop dates in addition to other pertinent events   12/08 Admit, ortho consulted, start ABx, Lt pig tail catheter placed, start tPA/dornase  Studies:  CT angio chest 06/02/21 >> large complex Lt effusion with pleural enhancement with partial loculation, ATX LLL, cavitary area lingula, splenomegaly Lt pleural fluid 06/02/21 >> glucose less than 20, protein 5.4, LDH 4696, 10625 cells (92% neutrophils) MRI12/02/2021 1. Rim-enhancing abscess within the medial subcutaneous soft tissues at the level of the tibial plateau measuring 3.6 x 2.0 x 2.9 cm. 2. Mild diffuse intramuscular edema involving the anterior and deep posterior compartments of the lower leg with associated perifascial edema, compatible with myofasciitis. No evidence of myonecrosis or pyomyositis. 3. Mild tenosynovitis associated with the posteromedial ankle tendons, which may be reactive or infectious. mri No evidence of osteomyelitis. 06/05/2021 tPA administered  Interim History / Subjective:  Breathing better.  Still has pain in her legs, but better.  Feels hungry.  Objective   Blood pressure 132/84, pulse 82, temperature 98.2 F (36.8 C), temperature source Oral, resp. rate 16, height 5\' 3"  (1.6 m), weight 85.7 kg, SpO2 100 %.         Intake/Output Summary (Last 24 hours) at 06/05/2021 0911 Last data filed at 06/05/2021 0518 Gross per 24 hour  Intake 720 ml  Output 1560 ml  Net -840 ml   Filed Weights   06/02/21 0036 06/02/21 0456 06/02/21 1331  Weight: 89.8 kg 86.4 kg 85.7 kg    Examination:  General: Middle-aged female no acute distress at rest HEENT: MM pink/moist Neuro: Anxious crying CV: Heart sounds are regular PULM: Decreased breath sounds in the bases GI: soft, bsx4 active  GU: Voids Extremities: warm/dry, positive edema lower extremity wraps Skin: Cellulitis lower extremity    Resolved Hospital Problem list     Assessment & Plan:   Lt pleural space empyema with cavitary pneumonia in setting of IVDA and Leg abscess and cellulitis. Pigtail placed 06/02/2021 tPA placed 06/03/2021 with good drainage therefore we will evaluate daily and continue tPA repeated 06/05/2021 with previous 2 days of drainage of 320 cc/day  Anxiety, hx of IVDA. Per primary team  Leg abscess, Rt lower leg cellulitis, Rt axillary stranding. Continue to monitor Continue antimicrobial therapy MRI has been reviewed No orthopedic interventions at this time.   Microcytic anemia. Recent Labs    06/03/21 2340 06/05/21 0428  HGB 8.1* 8.5*   Transfuse per protocol all through the deferring intervention at this time  Patient's boyfriend updated at bedside 06/05/2021  Best Practice (right click and "Reselect all SmartList Selections" daily)   Per primary Labs    CMP Latest Ref Rng & Units 06/05/2021 06/03/2021 06/03/2021  Glucose 70 - 99 mg/dL 14/02/2021) 485(I) 627(O)  BUN 6 - 20 mg/dL 6 10 8  Creatinine 0.44 - 1.00 mg/dL 0.61 0.49 0.56  Sodium 135 - 145 mmol/L 139 135 133(L)  Potassium 3.5 - 5.1 mmol/L 3.7 4.4 4.2  Chloride 98 - 111 mmol/L 107 105 101  CO2 22 - 32 mmol/L 24 22 26   Calcium 8.9 - 10.3 mg/dL 8.0(L) 7.6(L) 7.4(L)  Total Protein 6.5 - 8.1 g/dL - 6.2(L) -  Total Bilirubin 0.3 - 1.2 mg/dL - QUANTITY  NOT SUFFICIENT, UNABLE TO PERFORM TEST -  Alkaline Phos 38 - 126 U/L - 69 -  AST 15 - 41 U/L - 27 -  ALT 0 - 44 U/L - QUANTITY NOT SUFFICIENT, UNABLE TO PERFORM TEST -    CBC Latest Ref Rng & Units 06/05/2021 06/03/2021 06/03/2021  WBC 4.0 - 10.5 K/uL 6.9 8.5 -  Hemoglobin 12.0 - 15.0 g/dL 8.5(L) 8.1(L) 6.4(LL)  Hematocrit 36.0 - 46.0 % 28.0(L) 26.1(L) 22.0(L)  Platelets 150 - 400 K/uL 492(H) 420(H) -    Signature:  Richardson Landry Adaijah Endres ACNP Acute Care Nurse Practitioner [place] Please consult [place] 06/05/2021, 9:11 AM

## 2021-06-05 NOTE — Procedures (Signed)
Pleural Fibrinolytic Administration Procedure Note  Sharon [name]  [id number]  [birth year]  Date:06/05/21  Time:9:06 AM   Provider Performing:Steve Doretta Remmert ACNP Acute Care Nurse Practitioner [facility] Please consult [place] 06/05/2021, 9:07 AM   Procedure: Pleural Fibrinolysis Subsequent day (77824)  Indication(s) Fibrinolysis of complicated pleural effusion  Consent Risks of the procedure as well as the alternatives and risks of each were explained to the patient and/or caregiver.  Consent for the procedure was obtained.   Anesthesia None   Time Out Verified patient identification, verified procedure, site/side was marked, verified correct patient position, special equipment/implants available, medications/allergies/relevant history reviewed, required imaging and test results available.   Sterile Technique Hand hygiene, gloves   Procedure Description Existing pleural catheter was cleaned and accessed in sterile manner.  10mg  of tPA in 30cc of saline and 5mg  of dornase in 30cc of sterile water were injected into pleural space using existing pleural catheter.  Catheter will be clamped for 1 hour and then placed back to suction. Rt chest tube  Complications/Tolerance None; patient tolerated the procedure well.  EBL None   Specimen(s) None

## 2021-06-05 NOTE — Progress Notes (Signed)
I was contacted regarding patient's superficial right knee abscess. Patient was apparently previously evaluated by Dr. Ave Filter, and then by Dr. Sherilyn Dacosta. Per Dr. Jarvis Newcomer, general surgery and IR have stated that they would not be willing to drain abscess. I then discussed patient with Dr. Sherilyn Dacosta, and he will attempt to proceed with abscess drainage tomorrow, depending on OR availability. Please make patient NPO after midnight tonight.

## 2021-06-05 NOTE — Progress Notes (Signed)
PROGRESS NOTE  MOUNA YAGER  ZOX:[id number] DOB: 1991/04/07 DOA: 06/02/2021 PCP: Patient, No Pcp Per (Inactive)   Brief Narrative: LASONDRA HODGKINS is a 30 y.o. female with a history of IVDU, tobacco use, anxiety who presented to the ED on 12/8 with infected wound of the left leg, painful swelling of the right ankle, and fever with cough. She was tachycardic with borderline hypotension, given IVF. WBC 20k. CTA chest revealed cavitary pneumonia with associated loculated left pleural effusion. PCCM was consulted, placed pigtail catheter and is proceeding with fibrinolytic. Right ankle was tapped with no fluid return in ED. Swelling has improved with IV antibiotics alone, so orthopedics not currently recommending surgical management. Blood culture and pleural fluid culture are pending, echocardiogram ordered. ID guiding antibiotics, changed now to vancomycin and cefepime. Additional abscesses noted in RLE on MRI for which orthopedics is reconsulted.  Assessment & Plan: Principal Problem:   Sepsis (HCC) Active Problems:   IV drug abuse (HCC)   Substance abuse (HCC)   Empyema lung (HCC)  Sepsis due to multiple infections, concern for bacteremia in IVDU:  - Continue vancomycin, cefepime  - Monitor blood cultures (NGTD x3 days on admission, and 2 sets NGTD 2 days) - Echocardiogram (TTE) without vegetations.  Cavitary lingula pneumonia and empyema:  - Appreciate PCCM management s/p chest tube 12/8, instilling lytics prn (12/9, 12/11) - F/u pleural fluid culture (was not set up, now being done)  Right LE abscesses and cellulitis, possible right ankle septic arthritis:  - Pt declining orthopedic's recommendation for arthrocentesis.  - Awaiting orthopedics input regarding I&D abscess medial to tibial plateau that appears superficial on exam. IR and general surgery deferring at this time.  - LE venous U/S negative for DVT  Right axillary stranding: Unclear significance. No complaints from  patient, though there is some AC erythema and pt injects in UE's.   - MR right humerus  Left calf abscess: Spontaneously draining.  - Continue local wound care and abx as above.  IVDU, narcotic use:  - Monitor for withdrawal.  - Clonidine prn - Cessation counseling provided - HIV NR, hep B Ab+, Ag - (immune), other infectious work up per ID  Tobacco use:  - Cessation counseling - Nicotine patch  Microcytic anemia: Exacerbated by IVF causing hemodilution. No active bleeding noted.  - Hgb improved with transfusion 1u PRBC 12/9. Continue monitoring.   Hypoalbuminemia: Suspected malnutrition.  - Dietitian consulted.   Hypokalemia: Resolved  Anxiety:  - Xanax prn, increase dose to 0.5mg . Continue clonidine.  Obesity: Estimated body mass index is 33.47 kg/m as calculated from the following:   Height as of this encounter:  (1.6 m).   Weight as of this encounter: 85.7 kg.  DVT prophylaxis: Lovenox Code Status: Full Family Communication: Boyfriend at bedside this AM Disposition Plan:  Status is: Inpatient  Remains inpatient appropriate because: continued work up of multiple infections, requiring IV abx.  Consultants:  PCCM Orthopedics Infectious diseases  Procedures:  Left pigtail catheter/chest tube placement 06/02/2021  Antimicrobials: Vancomycin cefepime >>  Flagyl 12/8 - 12/9   Subjective: Anxiety is uncontrolled. Takes higher doses of anxiolytic at home. No sedation noted. Pain with chest tube dressing change this morning. Pain worsening in right leg where an abscess is developing more and more. No dyspnea.   Objective: Vitals:   06/04/21 1439 06/04/21 2332 06/05/21 0433 06/05/21 0803  BP: 127/78 120/82 118/73 132/84  Pulse:  77 78 82  Resp:  Temp:  98.5 F (36.9 C) 98.2 F (36.8 C) 98.2 F (36.8 C)  TempSrc:  Oral Oral Oral  SpO2:  96% 94% 100%  Weight:      Height:        Intake/Output Summary (Last 24 hours) at 06/05/2021 1610 Last  data filed at 06/05/2021 0518 Gross per 24 hour  Intake 720 ml  Output 1560 ml  Net -840 ml   Filed Weights   06/02/21 0036 06/02/21 0456 06/02/21 1331  Weight: 89.8 kg 86.4 kg 85.7 kg   Gen: 30 y.o. female in no distress Pulm: Nonlabored breathing room air. Clear. CV: Regular borderline tachycardia. No new murmur, rub, or gallop. No JVD, no pitting symmetric dependent edema. GI: Abdomen soft, non-tender, non-distended, with normoactive bowel sounds.  Ext: Warm, no deformities. Diffuse swelling RLE improved, medial RLE at tibial plateau has enlarging fluctuant area with overlying erythema that is exquisitely tender. Skin: No other new rashes, lesions or ulcers on visualized skin. Chest tube site c/d/i Neuro: Alert and oriented. No focal neurological deficits. Psych: Judgement and insight appear fair. Mood euthymic & affect congruent. Behavior is appropriate.    Data Reviewed: I have personally reviewed following labs and imaging studies  CBC: Recent Labs  Lab 06/02/21 0221 06/03/21 0152 06/03/21 1229 06/03/21 2340 06/05/21 0428  WBC 20.5* 12.5*  --  8.5 6.9  NEUTROABS 15.4*  --   --  5.4 4.7  HGB 8.7* 7.1* 6.4* 8.1* 8.5*  HCT 28.8* 23.5* 22.0* 26.1* 28.0*  MCV 74.2* 75.1*  --  75.4* 77.1*  PLT 489* 407*  --  420* 492*   Basic Metabolic Panel: Recent Labs  Lab 06/02/21 0221 06/03/21 0152 06/03/21 2340 06/05/21 0428  NA 131* 133* 135 139  K 3.2* 4.2 4.4 3.7  CL 95* 101 105 107  CO2 GLUCOSE 108* 102* 102* 120*  BUN CREATININE 0.69 0.56 0.49 0.61  CALCIUM 7.8* 7.4* 7.6* 8.0*  MG  --  1.7  --  2.0  PHOS  --   --   --  3.1   GFR: Estimated Creatinine Clearance: 106.6 mL/min (by C-G formula based on SCr of 0.61 mg/dL). Liver Function Tests: Recent Labs  Lab 06/02/21 0221 06/03/21 2340  AST 29 27  ALT 21 QUANTITY NOT SUFFICIENT, UNABLE TO PERFORM TEST  ALKPHOS 106 69  BILITOT 1.8* QUANTITY NOT SUFFICIENT, UNABLE TO PERFORM TEST  PROT  8.4* 6.2*  ALBUMIN 2.0* <1.5*   No results for input(s): LIPASE, AMYLASE in the last 168 hours. No results for input(s): AMMONIA in the last 168 hours. Coagulation Profile: Recent Labs  Lab 06/02/21 0450  INR 1.2   Cardiac Enzymes: No results for input(s): CKTOTAL, CKMB, CKMBINDEX, TROPONINI in the last 168 hours. BNP (last 3 results) No results for input(s): PROBNP in the last 8760 hours. HbA1C: No results for input(s): HGBA1C in the last 72 hours. CBG: No results for input(s): GLUCAP in the last 168 hours. Lipid Profile: No results for input(s): CHOL, HDL, LDLCALC, TRIG, CHOLHDL, LDLDIRECT in the last 72 hours. Thyroid Function Tests: No results for input(s): TSH, T4TOTAL, FREET4, T3FREE, THYROIDAB in the last 72 hours. Anemia Panel: Recent Labs    06/03/21 0152  FERRITIN 108  TIBC 199*  IRON 16*  RETICCTPCT 2.9   Urine analysis:    Component Value Date/Time   COLORURINE AMBER (A) 06/02/2021 0745   APPEARANCEUR HAZY (A) 06/02/2021 0745   LABSPEC 1.020 06/02/2021 0745  PHURINE 5.5 06/02/2021 0745   GLUCOSEU NEGATIVE 06/02/2021 0745   HGBUR TRACE (A) 06/02/2021 0745   BILIRUBINUR MODERATE (A) 06/02/2021 0745   KETONESUR NEGATIVE 06/02/2021 0745   PROTEINUR 30 (A) 06/02/2021 0745   NITRITE POSITIVE (A) 06/02/2021 0745   LEUKOCYTESUR NEGATIVE 06/02/2021 0745   Recent Results (from the past 240 hour(s))  Resp Panel by RT-PCR (Flu A&B, Covid) Nasopharyngeal Swab     Status: None   Collection Time: 06/02/21  4:50 AM   Specimen: Nasopharyngeal Swab; Nasopharyngeal(NP) swabs in vial transport medium  Result Value Ref Range Status   SARS Coronavirus 2 by RT PCR NEGATIVE NEGATIVE Final    Comment: (NOTE) SARS-CoV-2 target nucleic acids are NOT DETECTED.  The SARS-CoV-2 RNA is generally detectable in upper respiratory specimens during the acute phase of infection. The lowest concentration of SARS-CoV-2 viral copies this assay can detect is 138 copies/mL. A negative  result does not preclude SARS-Cov-2 infection and should not be used as the sole basis for treatment or other patient management decisions. A negative result may occur with  improper specimen collection/handling, submission of specimen other than nasopharyngeal swab, presence of viral mutation(s) within the areas targeted by this assay, and inadequate number of viral copies(<138 copies/mL). A negative result must be combined with clinical observations, patient history, and epidemiological information. The expected result is Negative.  Fact Sheet for Patients:  BloggerCourse.com  Fact Sheet for Healthcare Providers:  SeriousBroker.it  This test is no t yet approved or cleared by the [place] FDA and  has been authorized for detection and/or diagnosis of SARS-CoV-2 by FDA under an Emergency Use Authorization (EUA). This EUA will remain  in effect (meaning this test can be used) for the duration of the COVID-19 declaration under Section 564(b)(1) of the Act, 21 U.S.C.section 360bbb-3(b)(1), unless the authorization is terminated  or revoked sooner.       Influenza A by PCR NEGATIVE NEGATIVE Final   Influenza B by PCR NEGATIVE NEGATIVE Final    Comment: (NOTE) The Xpert Xpress SARS-CoV-2/FLU/RSV plus assay is intended as an aid in the diagnosis of influenza from Nasopharyngeal swab specimens and should not be used as a sole basis for treatment. Nasal washings and aspirates are unacceptable for Xpert Xpress SARS-CoV-2/FLU/RSV testing.  Fact Sheet for Patients: BloggerCourse.com  Fact Sheet for Healthcare Providers: SeriousBroker.it  This test is not yet approved or cleared by the [place] FDA and has been authorized for detection and/or diagnosis of SARS-CoV-2 by FDA under an Emergency Use Authorization (EUA). This EUA will remain in effect (meaning this test can be used)  for the duration of the COVID-19 declaration under Section 564(b)(1) of the Act, 21 U.S.C. section 360bbb-3(b)(1), unless the authorization is terminated or revoked.  Performed at [facility], [address] [place], [place] [address]   Blood Culture (routine x 2)     Status: None (Preliminary result)   Collection Time: 06/02/21  4:50 AM   Specimen: Left Antecubital; Blood  Result Value Ref Range Status   Specimen Description   Final    LEFT ANTECUBITAL Performed at [facility], [address] [place], [place] [address]    Special Requests   Final    BOTTLES DRAWN AEROBIC AND ANAEROBIC Blood Culture adequate volume Performed at [facility], [address] [place], [place] [address]    Culture   Final    NO GROWTH 3 DAYS Performed at [facility], [address], [place], [place] [address]    Report Status PENDING  Incomplete  Urine Culture     Status: Abnormal   Collection Time: 06/02/21  7:45 AM   Specimen: In/Out Cath Urine  Result Value Ref Range Status   Specimen Description   Final    IN/OUT CATH URINE Performed at [facility], [address] [place], [place] [address]    Special Requests   Final    NONE Performed at [facility], [address] [place], [place] [address]    Culture MULTIPLE SPECIES PRESENT, SUGGEST RECOLLECTION (A)  Final   Report Status 06/03/2021 FINAL  Final  Culture, blood (Routine X 2) w Reflex to ID Panel     Status: None (Preliminary result)   Collection Time: 06/03/21 12:21 PM   Specimen: BLOOD LEFT HAND  Result Value Ref Range Status   Specimen Description BLOOD LEFT HAND  Final   Special Requests   Final    BOTTLES DRAWN AEROBIC AND ANAEROBIC Blood Culture results may not be optimal due to an inadequate volume of blood received in culture bottles   Culture   Final    NO GROWTH 2 DAYS Performed at [facility], [address], [place], [place] [address]     Report Status PENDING  Incomplete  Culture, blood (Routine X 2) w Reflex to ID Panel     Status: None (Preliminary result)   Collection Time: 06/03/21 12:31 PM   Specimen: BLOOD LEFT HAND  Result Value Ref Range Status   Specimen Description BLOOD LEFT HAND  Final   Special Requests   Final    BOTTLES DRAWN AEROBIC ONLY Blood Culture results may not be optimal due to an inadequate volume of blood received in culture bottles   Culture   Final    NO GROWTH 2 DAYS Performed at [facility], [address], [place], [place] [address]    Report Status PENDING  Incomplete      Radiology Studies: MR TIBIA FIBULA RIGHT W WO CONTRAST  Result Date: 06/04/2021 CLINICAL DATA:  Soft tissue infection suspected, lower leg, xray done EXAM: MRI OF LOWER RIGHT EXTREMITY WITHOUT AND WITH CONTRAST TECHNIQUE: Multiplanar, multisequence MR imaging of the right tibia and fibula was performed both before and after administration of intravenous contrast. CONTRAST:  90mL GADAVIST GADOBUTROL 1 MMOL/ML IV SOLN COMPARISON:  Ankle x-ray 06/02/2021 FINDINGS: Bones/Joint/Cartilage No acute fracture. No malalignment. No bone marrow edema. No cortical destruction. No marrow replacing bone lesion. Trace tibiotalar joint effusion. Ligaments Grossly intact. Muscles and Tendons Small volume tenosynovial fluid associated with the posteromedial ankle tendons. Tendinous structures appear otherwise within normal limits. The mild diffuse intramuscular edema involving the anterior and deep posterior compartments of the lower leg with associated perifascial edema. Edema is most pronounced within the tibialis posterior and flexor hallucis longus muscles. Edema tracks along the tibiofibular syndesmosis. No organized intramuscular fluid collection. Soft tissues Rim enhancing fluid collection within the medial subcutaneous soft tissues at the level of the tibial plateau. Collection measures 3.6 x 2.0 x 2.9 cm (series 7, image 11).  Subcutaneous edema extends along the length of the lower leg, most pronounced anterolaterally with fluid overlying the superficial investing fascia of the anterior compartment. IMPRESSION: 1. Rim-enhancing abscess within the medial subcutaneous soft tissues at the level of the tibial plateau measuring 3.6 x 2.0 x 2.9 cm. 2. Mild diffuse intramuscular edema involving the anterior and deep  posterior compartments of the lower leg with associated perifascial edema, compatible with myofasciitis. No evidence of myonecrosis or pyomyositis. 3. Mild tenosynovitis associated with the posteromedial ankle tendons, which may be reactive or infectious. 4. No evidence of osteomyelitis. Electronically Signed   By: Duanne Guess D.O.   On: 06/04/2021 12:02   DG Chest Port 1 View  Result Date: 06/05/2021 CLINICAL DATA:  30 year old female with shortness of breath, chest pain EXAM: PORTABLE CHEST 1 VIEW COMPARISON:  Prior chest x-ray 06/04/2021 FINDINGS: Left-sided pigtail thoracostomy tube in unchanged position. Persistent loculated fluid within the fissure and the lateral periphery of the lung. The right lung remains clear. Cardiac and mediastinal contours are unchanged. No pneumothorax. No acute osseous abnormality. IMPRESSION: Stable position of left-sided pigtail thoracostomy tube with residual loculated effusion. Some fluid is trapped within the fissure. Right lung remains clear.  No new or acute abnormality. Electronically Signed   By: Malachy Moan M.D.   On: 06/05/2021 08:07   DG Chest Port 1 View  Result Date: 06/04/2021 CLINICAL DATA:  Shortness of breath, chest pain. EXAM: PORTABLE CHEST 1 VIEW COMPARISON:  June 02, 2021. FINDINGS: Stable cardiomediastinal silhouette. Lung is clear. Stable position of left-sided chest tube. Mild left basilar atelectasis is noted with small left pleural effusion. Left upper lobe opacity is noted concerning for atelectasis or infiltrate. Bony thorax is unremarkable.  IMPRESSION: Stable position of left-sided chest tube without pneumothorax. Left basilar atelectasis is noted with small left pleural effusion. Stable left upper lobe opacity as described above. Electronically Signed   By: Lupita Raider M.D.   On: 06/04/2021 09:39   ECHOCARDIOGRAM COMPLETE  Result Date: 06/03/2021    ECHOCARDIOGRAM REPORT   Patient Name:   ROSINE SOLECKI Jennings Date of Exam: 06/03/2021 Medical Rec #:  [id number]        Height:       63.0 in Accession #:    [id number]       Weight:       188.9 lb Date of Birth:  1991-06-22         BSA:          1.887 m Patient Age:    30 years         BP:           101/60 mmHg Patient Gender: F                HR:           92 bpm. Exam Location:  Inpatient Procedure: 2D Echo, Cardiac Doppler and Color Doppler Indications:    Endocarditis  History:        Patient has no prior history of Echocardiogram examinations.  Sonographer:    Cleatis Polka Referring Phys: [id number] PING T ZHANG IMPRESSIONS  1. Left ventricular ejection fraction, by estimation, is 60 to 65%. The left ventricle has normal function. The left ventricle has no regional wall motion abnormalities. Left ventricular diastolic parameters were normal.  2. Right ventricular systolic function is normal. The right ventricular size is normal. Tricuspid regurgitation signal is inadequate for assessing PA pressure.  3. Mild thickening of the mitral valve. No evidence of mitral valve regurgitation.  4. The aortic valve is grossly normal. Aortic valve regurgitation is not visualized.  5. The inferior vena cava is dilated in size with >50% respiratory variability, suggesting right atrial pressure of 8 mmHg. Comparison(s): No prior Echocardiogram. Conclusion(s)/Recommendation(s): Otherwise normal echocardiogram, with minor abnormalities described in the report. FINDINGS  Left Ventricle: Left ventricular ejection fraction, by estimation, is 60 to 65%. The left ventricle has normal function. The left ventricle has no  regional wall motion abnormalities. The left ventricular internal cavity size was normal in size. There is  no left ventricular hypertrophy. Left ventricular diastolic parameters were normal. Right Ventricle: The right ventricular size is normal. No increase in right ventricular wall thickness. Right ventricular systolic function is normal. Tricuspid regurgitation signal is inadequate for assessing PA pressure. Left Atrium: Left atrial size was normal in size. Right Atrium: Right atrial size was normal in size. Pericardium: There is no evidence of pericardial effusion. Mitral Valve: Mild thickening of the mitral valve. No evidence of mitral valve regurgitation. Tricuspid Valve: The tricuspid valve is normal in structure. Tricuspid valve regurgitation is not demonstrated. Aortic Valve: The aortic valve is grossly normal. Aortic valve regurgitation is not visualized. Aortic valve peak gradient measures 5.2 mmHg. Pulmonic Valve: The pulmonic valve was grossly normal. Pulmonic valve regurgitation is not visualized. Aorta: The aortic root and ascending aorta are structurally normal, with no evidence of dilitation. Venous: The inferior vena cava is dilated in size with greater than 50% respiratory variability, suggesting right atrial pressure of 8 mmHg. IAS/Shunts: No atrial level shunt detected by color flow Doppler.  LEFT VENTRICLE PLAX 2D LVIDd:         4.70 cm     Diastology LVIDs:         3.00 cm     LV e' medial:    9.68 cm/s LV PW:         0.90 cm     LV E/e' medial:  7.8 LV IVS:        1.00 cm     LV e' lateral:   9.46 cm/s LVOT diam:     2.00 cm     LV E/e' lateral: 8.0 LV SV:         50 LV SV Index:   26 LVOT Area:     3.14 cm  LV Volumes (MOD) LV vol d, MOD A2C: 65.6 ml LV vol d, MOD A4C: 84.0 ml LV vol s, MOD A2C: 27.4 ml LV vol s, MOD A4C: 35.9 ml LV SV MOD A2C:     38.2 ml LV SV MOD A4C:     84.0 ml LV SV MOD BP:      44.8 ml RIGHT VENTRICLE             IVC RV Basal diam:  3.60 cm     IVC diam: 2.20 cm RV  Mid diam:    2.40 cm RV S prime:     10.10 cm/s TAPSE (M-mode): 2.0 cm LEFT ATRIUM             Index        RIGHT ATRIUM           Index LA diam:        3.80 cm 2.01 cm/m   RA Area:     12.40 cm LA Vol (A2C):   29.0 ml 15.36 ml/m  RA Volume:   31.10 ml  16.48 ml/m LA Vol (A4C):   38.5 ml 20.40 ml/m LA Biplane Vol: 36.0 ml 19.07 ml/m  AORTIC VALVE AV Area (Vmax): 2.32 cm AV Vmax:        114.00 cm/s AV Peak Grad:   5.2 mmHg LVOT Vmax:      84.20 cm/s LVOT Vmean:     60.100 cm/s LVOT VTI:  0.158 m  AORTA Ao Root diam: 2.70 cm Ao Asc diam:  2.40 cm MITRAL VALVE MV Area (PHT): 6.12 cm    SHUNTS MV Decel Time: 124 msec    Systemic VTI:  0.16 m MV E velocity: 75.40 cm/s  Systemic Diam: 2.00 cm MV A velocity: 66.80 cm/s MV E/A ratio:  1.13 Photographer signed by Carolan Clines Signature Date/Time: 06/03/2021/3:35:49 PM    Final    VAS [place] LOWER EXTREMITY VENOUS (DVT)  Result Date: 06/03/2021  Lower Venous DVT Study Patient Name:  MARSHA GUNDLACH  Date of Exam:   06/03/2021 Medical Rec #: [id number]         Accession #:    [id number] Date of Birth: Feb 24, 1991          Patient Gender: F Patient Age:   30 years Exam Location:  [facility] Procedure:      VAS [place] LOWER EXTREMITY VENOUS (DVT) Referring Phys: Mikey College --------------------------------------------------------------------------------  Indications: RT ankle swelling, IV drug user.  Comparison Study: No prior studies. Performing Technologist: Jean Rosenthal RDMS, RVT  Examination Guidelines: A complete evaluation includes B-mode imaging, spectral Doppler, color Doppler, and power Doppler as needed of all accessible portions of each vessel. Bilateral testing is considered an integral part of a complete examination. Limited examinations for reoccurring indications may be performed as noted. The reflux portion of the exam is performed with the patient in reverse Trendelenburg.   +---------+---------------+---------+-----------+----------+--------------+ RIGHT    CompressibilityPhasicitySpontaneityPropertiesThrombus Aging +---------+---------------+---------+-----------+----------+--------------+ CFV      Full           Yes      Yes                                 +---------+---------------+---------+-----------+----------+--------------+ SFJ      Full                                                        +---------+---------------+---------+-----------+----------+--------------+ FV Prox  Full                                                        +---------+---------------+---------+-----------+----------+--------------+ FV Mid   Full                                                        +---------+---------------+---------+-----------+----------+--------------+ FV DistalFull                                                        +---------+---------------+---------+-----------+----------+--------------+ PFV      Full                                                        +---------+---------------+---------+-----------+----------+--------------+  POP      Full           Yes      Yes                                 +---------+---------------+---------+-----------+----------+--------------+ PTV      Full                                                        +---------+---------------+---------+-----------+----------+--------------+ PERO     Full                                                        +---------+---------------+---------+-----------+----------+--------------+ Gastroc  Full                                                        +---------+---------------+---------+-----------+----------+--------------+   +----+---------------+---------+-----------+----------+--------------+ LEFTCompressibilityPhasicitySpontaneityPropertiesThrombus Aging  +----+---------------+---------+-----------+----------+--------------+ CFV Full           Yes      Yes                                 +----+---------------+---------+-----------+----------+--------------+     Summary: RIGHT: - There is no evidence of deep vein thrombosis in the lower extremity.  - No cystic structure found in the popliteal fossa.  LEFT: - No evidence of common femoral vein obstruction.  *See table(s) above for measurements and observations.    Preliminary     Scheduled Meds:  acidophilus  2 capsule Oral Daily   enoxaparin (LOVENOX) injection  40 mg Subcutaneous Q24H   guaiFENesin  1,200 mg Oral BID   nicotine  21 mg Transdermal Daily   sodium chloride flush  10 mL Other Q8H   sodium chloride flush  10 mL Other Q8H   Continuous Infusions:  ceFEPime (MAXIPIME) IV 2 g (06/05/21 0520)   vancomycin 1,000 mg (06/05/21 0818)     LOS: 3 days   Time spent: 35 minutes.  Tyrone Nine, MD [facility] [email or web address] 06/05/2021, 9:23 AM

## 2021-06-05 NOTE — Progress Notes (Signed)
ID follow-up note:  Patient MRI noted a 3.6 x 2.0 x 2.9 centimeter abscess in the medial subcutaneous soft tissue at the level of the tibial plateau.  This was further evaluated with orthopedic surgery yesterday who recommended IR engagement for possible drainage of the larger soft tissue abscess.  Patient further declined repeat joint aspiration and in the absence of any bony abscess or septic arthritis no further orthopedic interventions were recommended.  IR recommended considering aspiration or I&D for further management of her abscess.  Pulmonary has been helping to manage her chest tube which over the last 24 hours has put out approximately 260 cc.  Her WBC has normalized, hemoglobin stabilized, and creatinine is stable.  She is immune to hepatitis B by prior vaccination.  She does not have chronic hepatitis C.  Her syphilis screen was negative and her GC/CT is pending.  Cultures thus far are unremarkable with negative blood cultures x3 sets.  Her pleural fluid cultures have also not revealed an organism.  She continues on vancomycin and cefepime.  Problems: 1.  Right lower leg infection: With subcutaneous soft tissue abscess and tenosynovitis of the posterior medial ankle tendons 2.  Left leg abscess: This is spontaneously draining 3.  Left lobe empyema and cavitary pneumonia: Status post chest tube placement 06/02/2021 and being managed with the help of pulmonology 4.  Injection drug use 5.  Right axillary stranding: Incidentally noted on CT chest 6.  Sepsis due to #1 through 3  Reccs: -- Continue vancomycin and cefepime per pharmacy -- Follow-up blood cultures -- Follow-up pleural fluid cultures.  I called microbiology this morning to get an update on her cultures as they have not resulted.  It appears they were not set up for culture in the lab, however, they will get this done today.  Gram stain should be available later today with cultures to follow. -- Would ask general surgery to see  regarding aspiration or incision and drainage -- MRI arm when able -- Will follow.  Appreciate assistance of CCM and surgical services for management of her infectious complications    Lady Deutscher [facility] 06/05/2021, 8:27 AM

## 2021-06-05 NOTE — Progress Notes (Signed)
Pharmacy Antibiotic Note  Sharon [name] is a 30 y.o. female admitted on 06/02/2021 with  leg wound infections .  Pharmacy has been consulted for vancomycin and cefepime dosing. She is currently receiving vancomycin 1000 mg every 12 hours with calculated AUC 536.  Plan: Continue vancomycin IV 1000 mg every 12 hours Continue cefepime IV 2 g every 8 hours Follow-up vancomycin levels tomorrow - peak ordered for 12/11 at 2300 and trough ordered for 12/12 at 0830 Future levels as indicated Monitor renal function   Height: 5\' 3"  (160 cm) Weight: 85.7 kg (188 lb 15 oz) IBW/kg (Calculated) : 52.4  Temp (24hrs), Avg:98.1 F (36.7 C), Min:97.9 F (36.6 C), Max:98.5 F (36.9 C)  Recent Labs  Lab 06/02/21 0221 06/02/21 0450 06/03/21 0152 06/03/21 2340 06/05/21 0428  WBC 20.5*  --  12.5* 8.5 6.9  CREATININE 0.69  --  0.56 0.49 0.61  LATICACIDVEN  --  1.7  --   --   --      Estimated Creatinine Clearance: 106.6 mL/min (by C-G formula based on SCr of 0.61 mg/dL).    No Known Allergies  Antimicrobials this admission: Vancomycin 12/7 >  Cefepime 12/7 >   Microbiology results: 12/8 BCx x 2 > ngtd (possibly drawn after dose of antibiotics) 12/8 UCx > multiple species, recollection recommended 12/9 Bcx > ngtd 12/8 pleural fluid cxs: in process  Thank you for including pharmacy in the care of this patient.  14/8, PharmD PGY1 Acute Care Pharmacy Resident  Phone: [phone] 06/05/2021  9:54 AM  Please check [email or web address] for unit-specific pharmacy phone numbers.

## 2021-06-06 ENCOUNTER — Encounter (HOSPITAL_COMMUNITY)
Admission: EM | Disposition: A | Discharge status: Home / Self Care | Payer: Self-pay | Source: Home / Self Care | Attending: Family Medicine

## 2021-06-06 ENCOUNTER — Inpatient Hospital Stay (HOSPITAL_COMMUNITY): Payer: Medicaid Other

## 2021-06-06 ENCOUNTER — Inpatient Hospital Stay (HOSPITAL_COMMUNITY): Payer: Medicaid Other | Admitting: Certified Registered Nurse Anesthetist

## 2021-06-06 ENCOUNTER — Encounter (HOSPITAL_COMMUNITY): Payer: Self-pay | Admitting: Internal Medicine

## 2021-06-06 DIAGNOSIS — J1289 Other viral pneumonia: Secondary | ICD-10-CM

## 2021-06-06 DIAGNOSIS — Z9689 Presence of other specified functional implants: Secondary | ICD-10-CM | POA: Diagnosis not present

## 2021-06-06 DIAGNOSIS — J869 Pyothorax without fistula: Secondary | ICD-10-CM

## 2021-06-06 LAB — GC/CHLAMYDIA PROBE AMP (~~LOC~~) NOT AT ARMC
Chlamydia: NEGATIVE
Comment: NEGATIVE
Comment: NORMAL
Neisseria Gonorrhea: NEGATIVE

## 2021-06-06 LAB — VANCOMYCIN, PEAK: Vancomycin Pk: 107 ug/mL (ref 30–40)

## 2021-06-06 LAB — VANCOMYCIN, RANDOM: Vancomycin Rm: 6

## 2021-06-06 IMAGING — DX DG CHEST 1V PORT
1 series · 1 of 1 positions shown · non-contrast
Comparison: [DATE], [DATE], [DATE]

CLINICAL DATA: 30-year-old female status post left thoracostomy
tube placement.

EXAM:
PORTABLE CHEST - 1 VIEW

[chest]
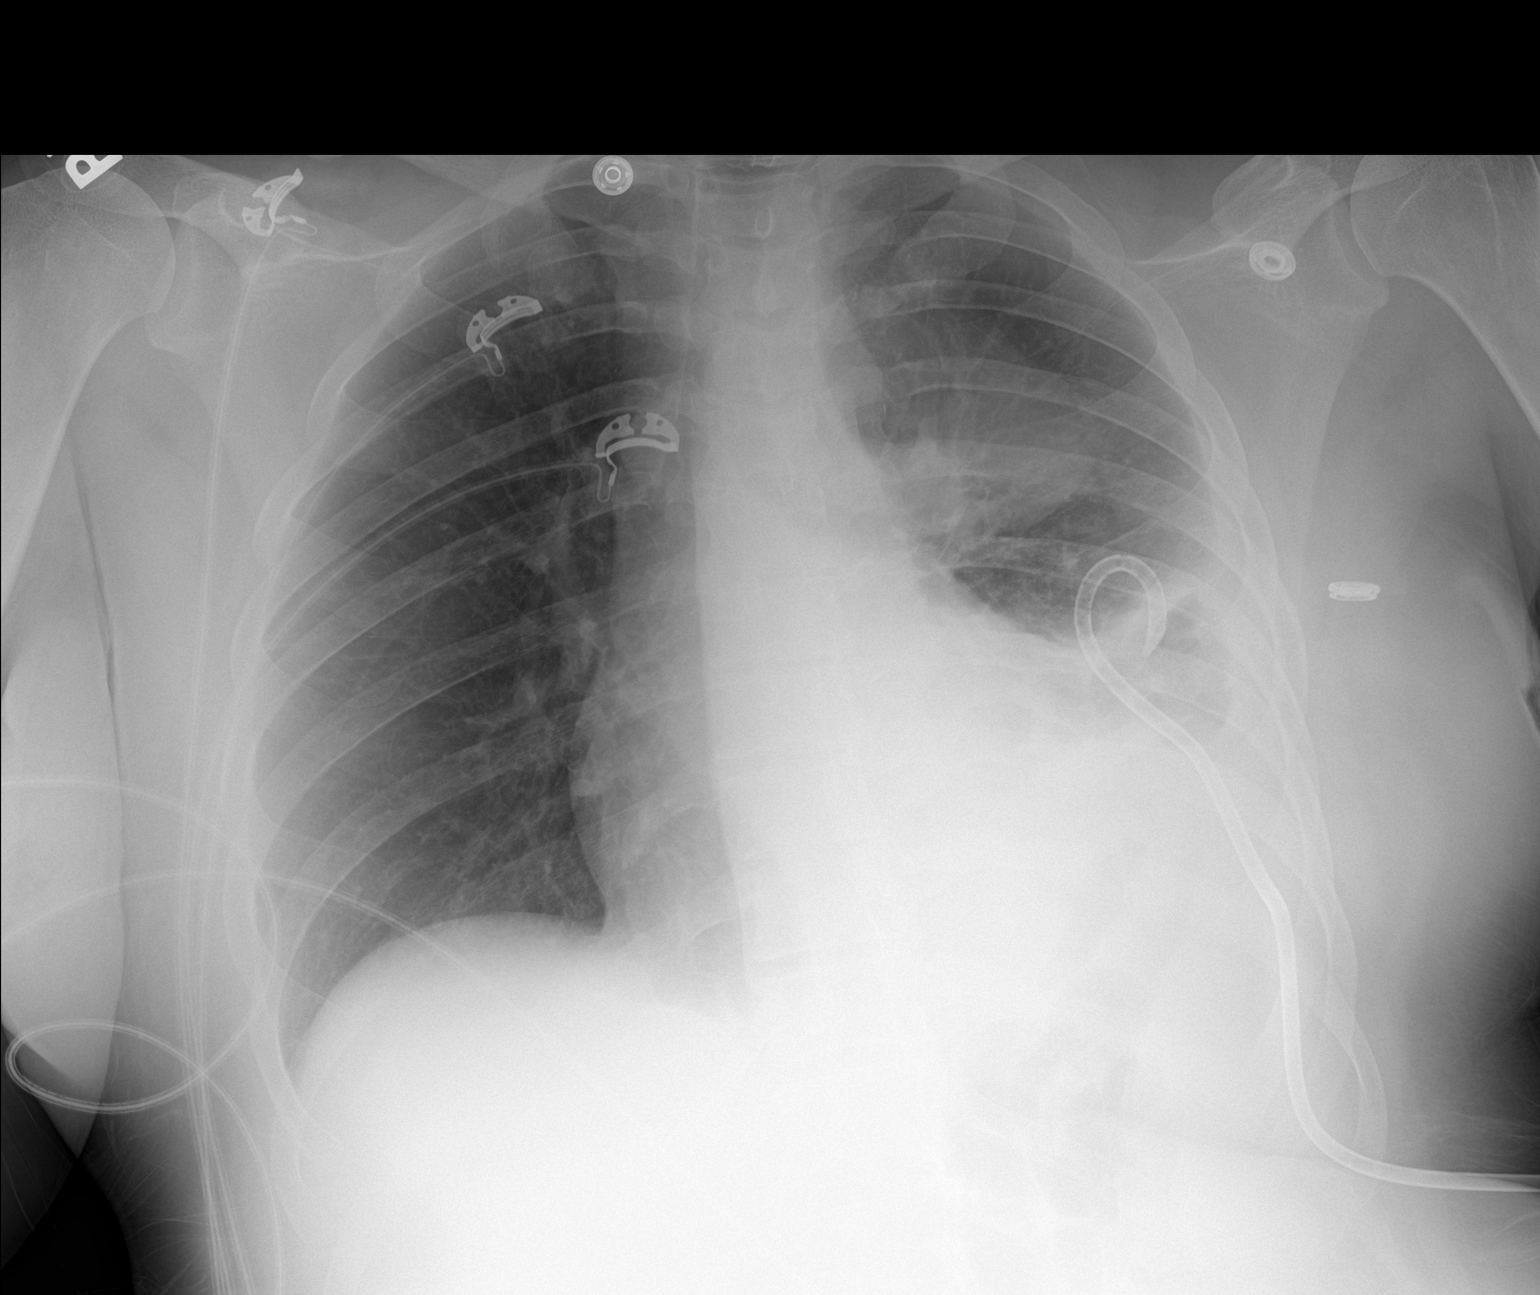

[1 of 1 positions shown; findings below may reference images not displayed]

FINDINGS: Persistent obscuration of the left inferior mediastinal silhouette.
Unchanged heart size. Unchanged position of indwelling left basilar
pigtail thoracostomy tube. Similar to slightly decreased prominence
of rounded opacity centered about the aortopulmonic window. The
right lung is clear. No evidence of pneumothorax. No cardiomegaly.
No acute osseous abnormality.
IMPRESSION: Similar appearing small left pleural effusion and left basilar
atelectasis with indwelling left pigtail thoracostomy tube. Similar
appearance of loculated/fissural upper lobe pleural effusion.

## 2021-06-06 IMAGING — MR MR HUMERUS*R* WO/W CM
4 of 10 series · 11 of 40 positions shown · IV contrast (gadavist)
Comparison: CTA chest dated [DATE].

CLINICAL DATA: Right axillary soft tissue stranding and fluid seen
on chest CT. Patient denies right upper arm complaints. History of
IV drug abuse with empyema and necrotizing pneumonia.

EXAM:
MRI OF THE RIGHT HUMERUS WITHOUT AND WITH CONTRAST
TECHNIQUE: Multiplanar, multisequence MR imaging of the right upper arm was
performed before and after the administration of intravenous
contrast.
CONTRAST:  8.5mL GADAVIST GADOBUTROL 1 MMOL/ML IV SOLN

[Series 5: T2 fat-sat · axial · 5.0mm · 0.33mm/px · z∈[-17,+226]mm · 3 of 56 slices shown]
[im 12/56]
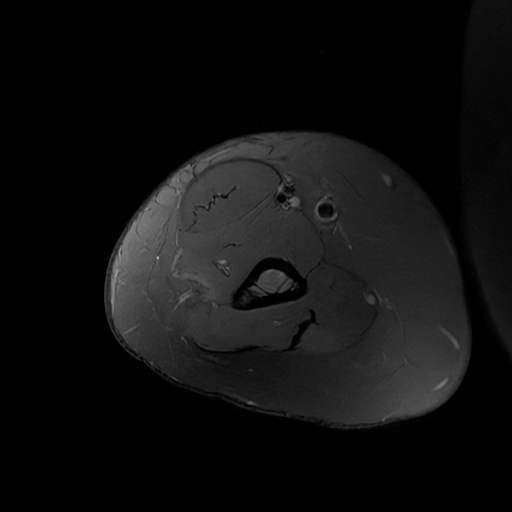
[im 34/56]
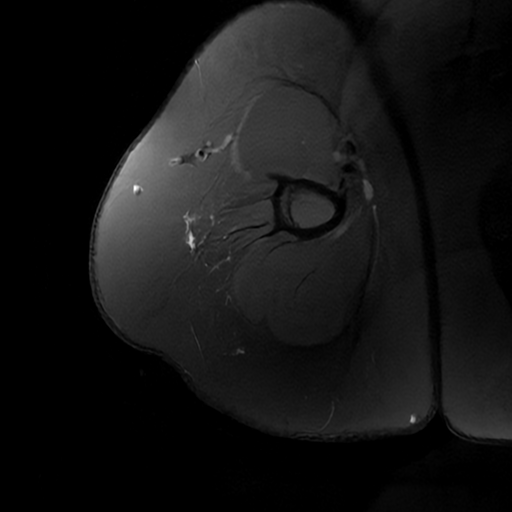
[im 56/56]
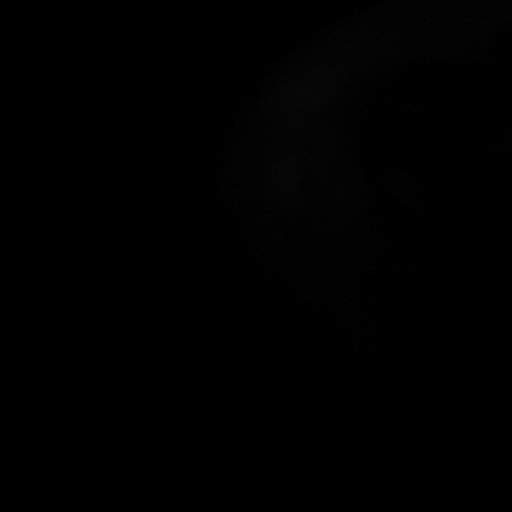

[Series 6: T1 · axial · 5.0mm · 0.33mm/px · z∈[-78,+226]mm · 3 of 56 slices shown (1 of 3)]
[im 1/56]
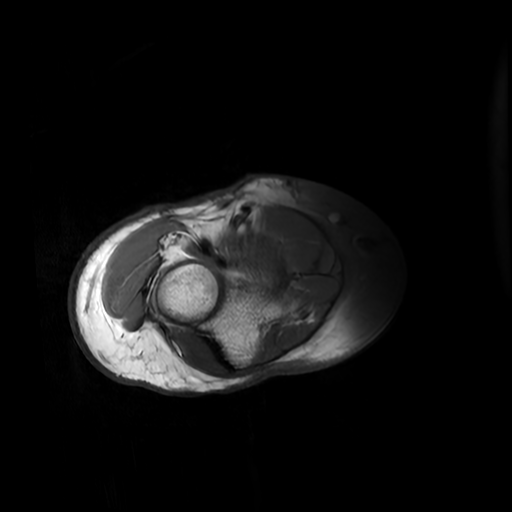
[im 28/56]
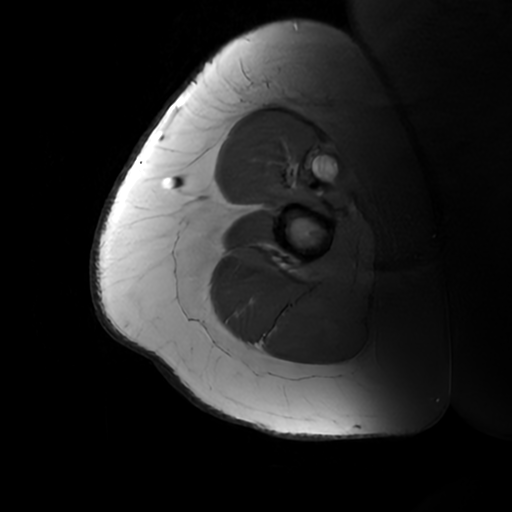
[im 56/56]
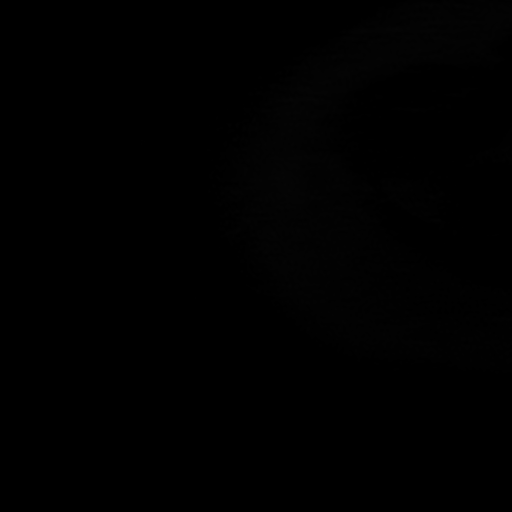

[Series 8: T1 · sagittal · 4.0mm · 0.31mm/px · 3 of 29 slices shown (2 of 3)]
[im 1/29]
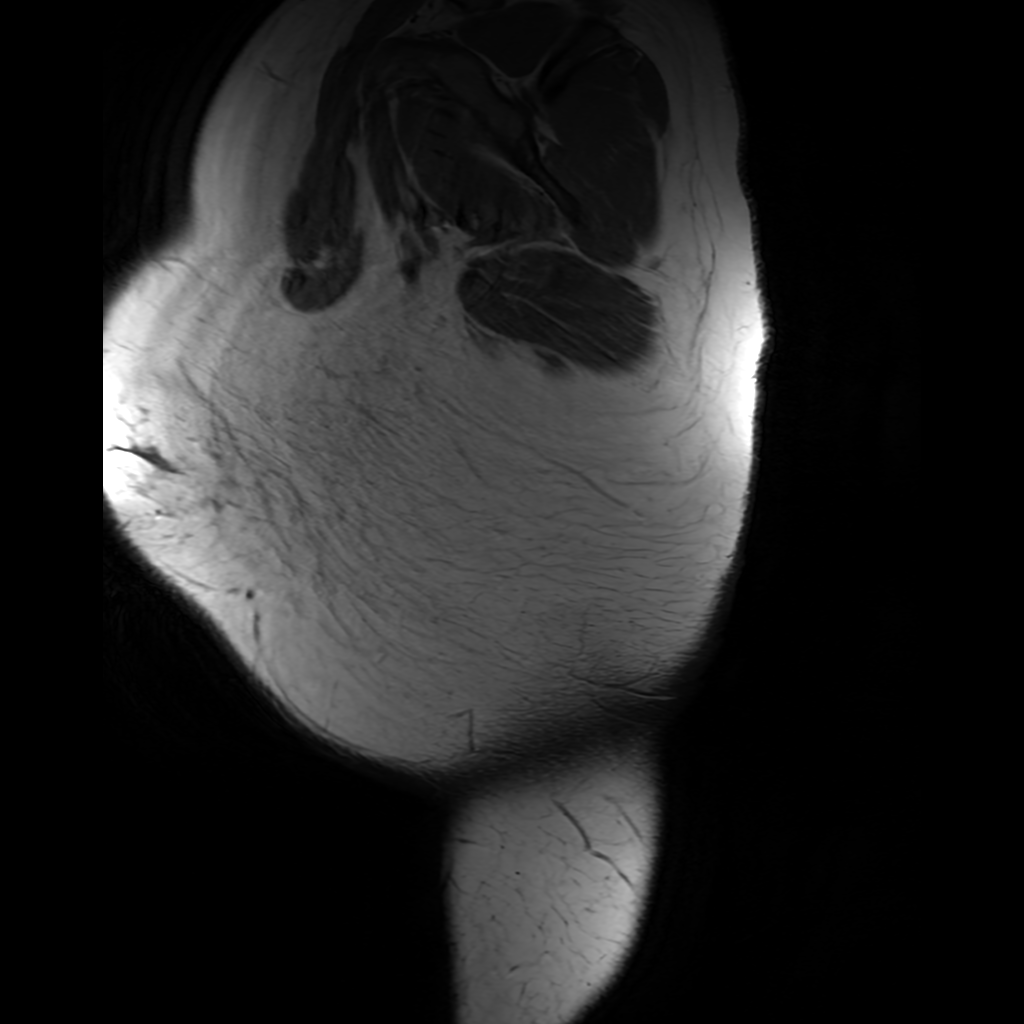
[im 15/29]
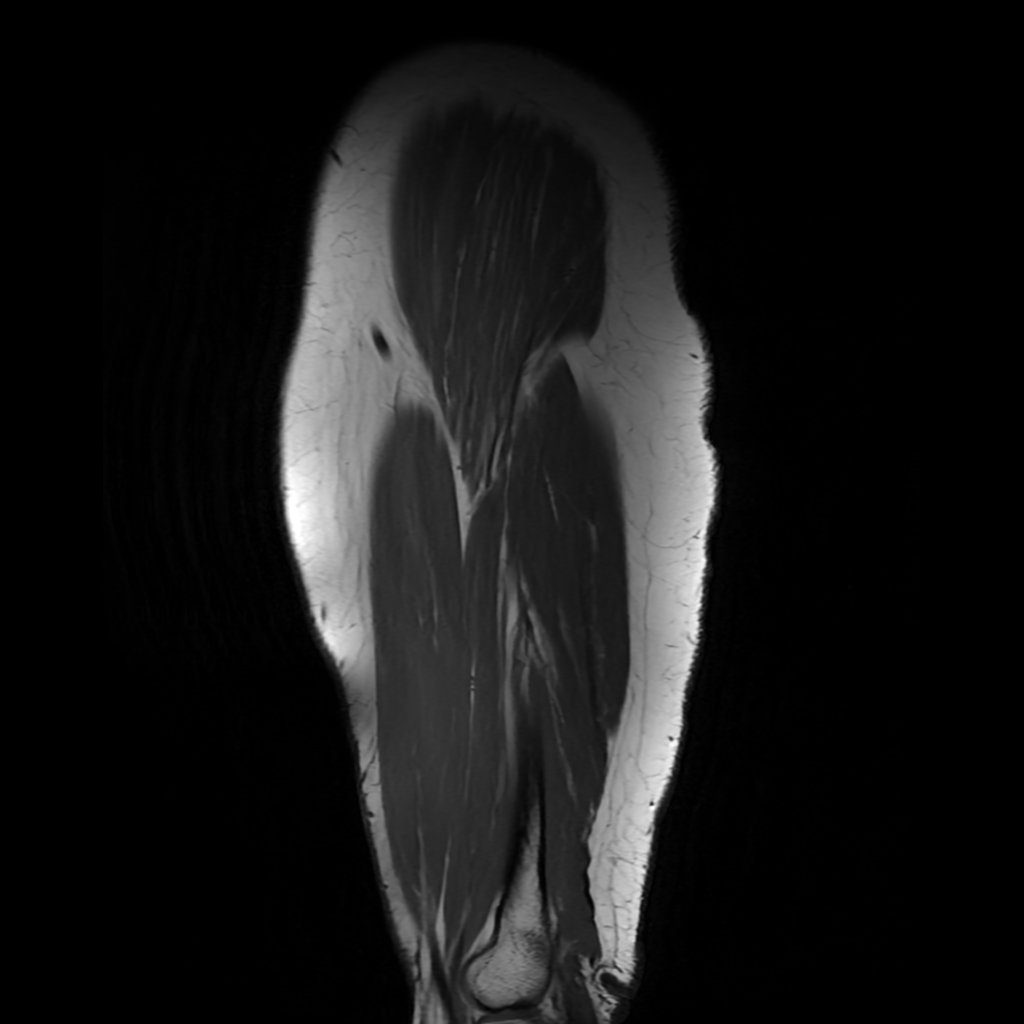
[im 29/29]
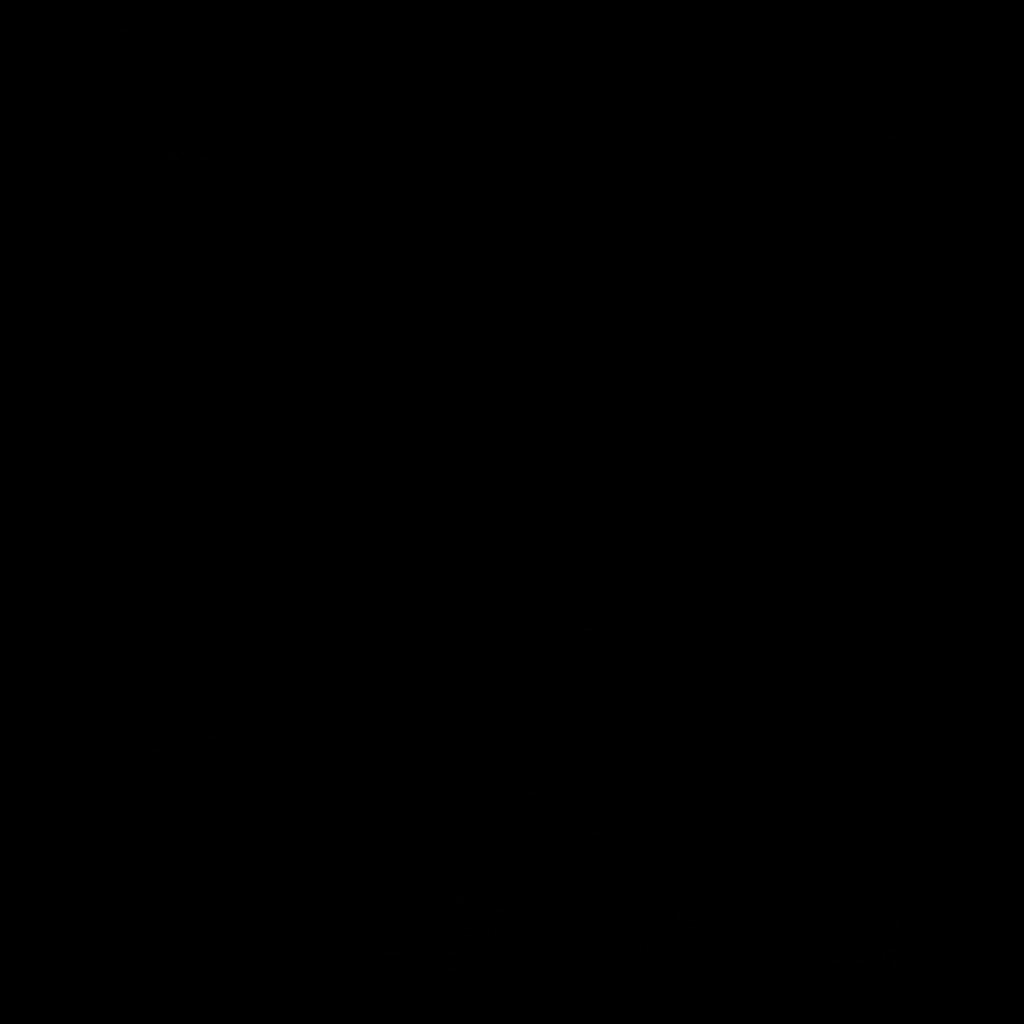

[Series 9: T1 · coronal · 4.0mm · 0.61mm/px · 2 of 38 slices shown (3 of 3)]
[im 1/38]
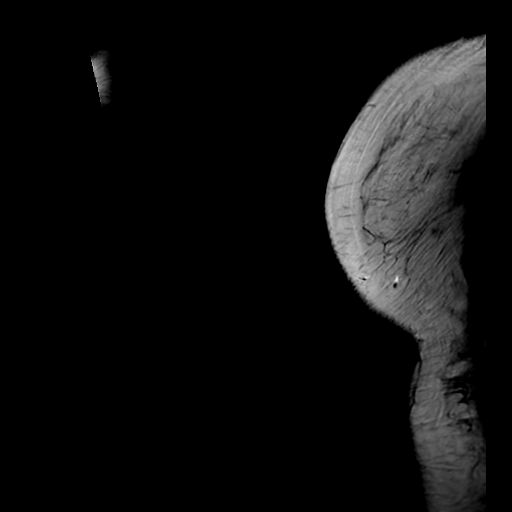
[im 19/38]
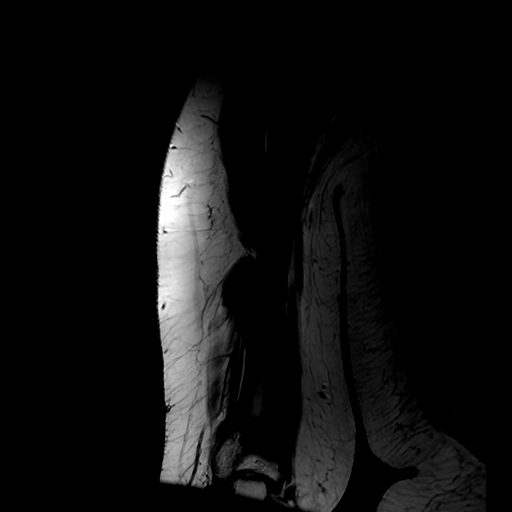

[11 of 40 positions shown; findings below may reference images not displayed]

FINDINGS: Bones/Joint/Cartilage

No marrow signal abnormality. No fracture or dislocation. Joint
spaces are preserved. No joint effusion.

Muscles and Tendons
Intact.  No muscle edema or atrophy.

Soft tissue
No fluid collection or hematoma.  No soft tissue mass.

Small right pleural effusion.
IMPRESSION: 1. No evidence of soft tissue infection or septic arthritis.
Stranding/fluid in the right axilla seen on recent CT has resolved.
2. Small right pleural effusion.

## 2021-06-06 SURGERY — IRRIGATION AND DEBRIDEMENT EXTREMITY
Anesthesia: General | Site: Leg Lower | Laterality: Right

## 2021-06-06 MED ORDER — FENTANYL CITRATE (PF) 250 MCG/5ML IJ SOLN
INTRAMUSCULAR | Status: AC
  Filled 2021-06-06: qty 5

## 2021-06-06 MED ORDER — DEXAMETHASONE SODIUM PHOSPHATE 10 MG/ML IJ SOLN
INTRAMUSCULAR | Status: AC
  Filled 2021-06-06: qty 1

## 2021-06-06 MED ORDER — DEXMEDETOMIDINE (PRECEDEX) IN NS 20 MCG/5ML (4 MCG/ML) IV SYRINGE
PREFILLED_SYRINGE | INTRAVENOUS | Status: AC
  Filled 2021-06-06: qty 5

## 2021-06-06 MED ORDER — ONDANSETRON HCL 4 MG/2ML IJ SOLN
INTRAMUSCULAR | Status: AC
  Filled 2021-06-06: qty 2

## 2021-06-06 MED ORDER — MIDAZOLAM HCL 2 MG/2ML IJ SOLN
INTRAMUSCULAR | Status: AC
  Filled 2021-06-06: qty 2

## 2021-06-06 MED ORDER — ORAL CARE MOUTH RINSE: 15.0000 mL | Freq: Once | OROMUCOSAL | Status: AC

## 2021-06-06 MED ORDER — VANCOMYCIN HCL IN DEXTROSE 1-5 GM/200ML-% IV SOLN
1000.0000 mg | Freq: Two times a day (BID) | INTRAVENOUS | Status: DC
  Administered 2021-06-06 – 2021-06-09 (×7): 1000 mg via INTRAVENOUS
  Filled 2021-06-06 (×8): qty 200

## 2021-06-06 MED ORDER — LACTATED RINGERS IV SOLN: INTRAVENOUS | Status: DC

## 2021-06-06 MED ORDER — GADOBUTROL 1 MMOL/ML IV SOLN
8.5000 mL | Freq: Once | INTRAVENOUS | Status: AC | PRN
  Administered 2021-06-06: 8.5 mL via INTRAVENOUS

## 2021-06-06 MED ORDER — VANCOMYCIN VARIABLE DOSE PER UNSTABLE RENAL FUNCTION (PHARMACIST DOSING): Status: DC

## 2021-06-06 MED ORDER — PROPOFOL 10 MG/ML IV BOLUS
INTRAVENOUS | Status: AC
  Filled 2021-06-06: qty 20

## 2021-06-06 MED ORDER — CHLORHEXIDINE GLUCONATE 0.12 % MT SOLN
15.0000 mL | Freq: Once | OROMUCOSAL | Status: AC
  Administered 2021-06-06: 15 mL via OROMUCOSAL

## 2021-06-06 MED ORDER — SUCCINYLCHOLINE CHLORIDE 200 MG/10ML IV SOSY
PREFILLED_SYRINGE | INTRAVENOUS | Status: AC
  Filled 2021-06-06: qty 10

## 2021-06-06 MED ORDER — LIDOCAINE 2% (20 MG/ML) 5 ML SYRINGE
INTRAMUSCULAR | Status: AC
  Filled 2021-06-06: qty 10

## 2021-06-06 NOTE — Progress Notes (Signed)
Pharmacy Antibiotic Note  Sharon [name] is a 30 y.o. female admitted on 06/02/2021 with  leg wound infections .  Pharmacy has been consulted for vancomycin and cefepime dosing. She is currently receiving vancomycin 1000 mg every 12 hours with calculated AUC 536.  -Vancomycin peak= 107 (collected ~ 11pm; dose given ~9pm) -Random level this afternoon = 6  Plan: Restarting Vancomycin at 1000 mg iv Q 12 hours ? Lab error for vancomycin peak 12/11 pm Follow up levels later this week    Height: 5\' 3"  (160 cm) Weight: 85.7 kg (188 lb 15 oz) IBW/kg (Calculated) : 52.4  Temp (24hrs), Avg:97.8 F (36.6 C), Min:97.5 F (36.4 C), Max:98.1 F (36.7 C)  Recent Labs  Lab 06/02/21 0221 06/02/21 0450 06/03/21 0152 06/03/21 2340 06/05/21 0428 06/05/21 2313 06/06/21 1418  WBC 20.5*  --  12.5* 8.5 6.9 7.0  --   CREATININE 0.69  --  0.56 0.49 0.61 0.47  --   LATICACIDVEN  --  1.7  --   --   --   --   --   VANCOPEAK  --   --   --   --   --  107*  --   VANCORANDOM  --   --   --   --   --   --  6     Estimated Creatinine Clearance: 106.6 mL/min (by C-G formula based on SCr of 0.47 mg/dL).    No Known Allergies  Antimicrobials this admission: Vancomycin 12/7 >  Cefepime 12/7 >   Microbiology results: 12/8 BCx x 2 > ngtd (possibly drawn after dose of antibiotics) 12/8 UCx > multiple species, recollection recommended 12/9 Bcx > ngtd 12/8 pleural fluid cxs: in process  Thank you for including pharmacy in the care of this patient.  14/8, PharmD Clinical Pharmacist **Pharmacist phone directory can now be found on [email or web address] ([email or web address]).  Listed under [facility].  [facility]

## 2021-06-06 NOTE — Progress Notes (Signed)
Pharmacy Antibiotic Note  Sharon [name] is a 30 y.o. female admitted on 06/02/2021 with  leg wound infections .  Pharmacy has been consulted for vancomycin and cefepime dosing. She is currently receiving vancomycin 1000 mg every 12 hours with calculated AUC 536.  -Vancomycin peak= 107 (collected ~ 11pm; dose given ~9pm)  Plan: -Hold vancomycin -Recheck a random vancomycin level at 11:30am    Height: 5\' 3"  (160 cm) Weight: 85.7 kg (188 lb 15 oz) IBW/kg (Calculated) : 52.4  Temp (24hrs), Avg:98 F (36.7 C), Min:97.5 F (36.4 C), Max:98.4 F (36.9 C)  Recent Labs  Lab 06/02/21 0221 06/02/21 0450 06/03/21 0152 06/03/21 2340 06/05/21 0428 06/05/21 2313  WBC 20.5*  --  12.5* 8.5 6.9 7.0  CREATININE 0.69  --  0.56 0.49 0.61 0.47  LATICACIDVEN  --  1.7  --   --   --   --   VANCOPEAK  --   --   --   --   --  107*     Estimated Creatinine Clearance: 106.6 mL/min (by C-G formula based on SCr of 0.47 mg/dL).    No Known Allergies  Antimicrobials this admission: Vancomycin 12/7 >  Cefepime 12/7 >   Microbiology results: 12/8 BCx x 2 > ngtd (possibly drawn after dose of antibiotics) 12/8 UCx > multiple species, recollection recommended 12/9 Bcx > ngtd 12/8 pleural fluid cxs: in process  Thank you for including pharmacy in the care of this patient.  14/8, PharmD Clinical Pharmacist **Pharmacist phone directory can now be found on [email or web address] ([email or web address]).  Listed under [facility].  [facility]

## 2021-06-06 NOTE — Progress Notes (Signed)
[facility]  Date of Admission:  06/02/2021           Reason for visit: Follow up on empyema, leg infection  Current antibiotics: Vancomycin 12/8-present Cefepime 12/8/      ASSESSMENT:    30 y.o. female admitted with:  Right lower leg infection: with subcutaneous soft tissue abscess that spontaneously drained overnight on 12/11 prior to going to the OR for I&D.  She is also s/p right ankle aspiration this morning for further evaluation of septic arthritis. Left leg abscess: this has been spontaneously draining. Left log empyema with cavitary pneumonia: Status post chest tube placement 06/02/21 and being managed with help of CCM.  Cultures NGTD. IVDU Right axillary stranding: Incidentally noted on CT chest.  MRI pending.   RECOMMENDATIONS:    Continue vancomycin and cefepime per pharmacy Follow up pleural fluid cultures Follow up joint aspiration cell count and cultures Follow up blood cultures Wound care Follow up right humerus MRI Will follow.   Principal Problem:   Sepsis (HCC) Active Problems:   IV drug abuse (HCC)   Substance abuse (HCC)   Empyema lung (HCC)    MEDICATIONS:    Scheduled Meds:  acidophilus  2 capsule Oral Daily   enoxaparin (LOVENOX) injection  40 mg Subcutaneous Q24H   guaiFENesin  1,200 mg Oral BID   nicotine  21 mg Transdermal Daily   povidone-iodine  2 application Topical Once   sodium chloride flush  10 mL Other Q8H   sodium chloride flush  10 mL Other Q8H   vancomycin variable dose per unstable renal function (pharmacist dosing)   Does not apply See admin instructions   Continuous Infusions:  ceFEPime (MAXIPIME) IV 2 g (06/06/21 0521)   PRN Meds:.acetaminophen **OR** acetaminophen, ALPRAZolam, cloNIDine, hydrOXYzine, ipratropium-albuterol, ketorolac  SUBJECTIVE:   24 hour events:  Pharmacy adjusting vancomycin dosing.  Continues on vanco, cefepime Orthopedic surgery to evaluate for possible  trip to the OR for right leg abscess.  Aspiration of right ankle done at bedside today as well.  WBC 7.0, Creat 0.47 Blood cultures negative, pleural fluid cultures negative.  MRSA PCR negative. 24 hr chest tube output is 80cc.  She reports that her right leg abscess spontaneously drained overnight.  Her left calf abscess is draining.  She has no fevers.  Her right ankle is still painful.  Her boyfriend is at the bedside.   Review of Systems  All other systems reviewed and are negative.    OBJECTIVE:   Blood pressure (!) 101/56, pulse 66, temperature (!) 97.5 F (36.4 C), temperature source Oral, resp. rate 12, height 5\' 3"  (1.6 m), weight 85.7 kg, last menstrual period 05/13/2021, SpO2 100 %. Body mass index is 33.47 kg/m.  Physical Exam Constitutional:      General: She is not in acute distress.    Appearance: Normal appearance.  HENT:     Head: Normocephalic and atraumatic.  Pulmonary:     Effort: Pulmonary effort is normal. No respiratory distress.     Comments: Left chest tube in place.  Diminished breath sounds at the bases.  Abdominal:     General: There is no distension.     Palpations: Abdomen is soft.     Tenderness: There is no abdominal tenderness.  Musculoskeletal:     Comments: Left calf wrapped in Ace. Right leg wrapped with bandage just below knee. Right ankle swelling and erythema improved.  Still with decreased ROM due to pain.  Skin:    General: Skin is warm and dry.     Findings: No rash.  Neurological:     General: No focal deficit present.     Mental Status: She is alert and oriented to person, place, and time.  Psychiatric:        Mood and Affect: Mood normal.        Behavior: Behavior normal.     Lab Results: Lab Results  Component Value Date   WBC 7.0 06/05/2021   HGB 8.2 (L) 06/05/2021   HCT 27.4 (L) 06/05/2021   MCV 79.0 (L) 06/05/2021   PLT 413 (H) 06/05/2021    Lab Results  Component Value Date   NA 134 (L) 06/05/2021   K 3.9  06/05/2021   CO2 24 06/05/2021   GLUCOSE 135 (H) 06/05/2021   BUN 10 06/05/2021   CREATININE 0.47 06/05/2021   CALCIUM 7.6 (L) 06/05/2021   GFRNONAA >60 06/05/2021   GFRAA >60 04/03/2019    Lab Results  Component Value Date   ALT QUANTITY NOT SUFFICIENT, UNABLE TO PERFORM TEST 06/03/2021   AST 27 06/03/2021   ALKPHOS 69 06/03/2021   BILITOT QUANTITY NOT SUFFICIENT, UNABLE TO PERFORM TEST 06/03/2021    No results found for: CRP  No results found for: ESRSEDRATE   I have reviewed the micro and lab results in Epic.  Imaging: DG Chest Port 1 View  Result Date: 06/05/2021 CLINICAL DATA:  30 year old female with shortness of breath, chest pain EXAM: PORTABLE CHEST 1 VIEW COMPARISON:  Prior chest x-ray 06/04/2021 FINDINGS: Left-sided pigtail thoracostomy tube in unchanged position. Persistent loculated fluid within the fissure and the lateral periphery of the lung. The right lung remains clear. Cardiac and mediastinal contours are unchanged. No pneumothorax. No acute osseous abnormality. IMPRESSION: Stable position of left-sided pigtail thoracostomy tube with residual loculated effusion. Some fluid is trapped within the fissure. Right lung remains clear.  No new or acute abnormality. Electronically Signed   By: Jacqulynn Cadet M.D.   On: 06/05/2021 08:07   DG Chest Port 1 View  Result Date: 06/04/2021 CLINICAL DATA:  Shortness of breath, chest pain. EXAM: PORTABLE CHEST 1 VIEW COMPARISON:  June 02, 2021. FINDINGS: Stable cardiomediastinal silhouette. Lung is clear. Stable position of left-sided chest tube. Mild left basilar atelectasis is noted with small left pleural effusion. Left upper lobe opacity is noted concerning for atelectasis or infiltrate. Bony thorax is unremarkable. IMPRESSION: Stable position of left-sided chest tube without pneumothorax. Left basilar atelectasis is noted with small left pleural effusion. Stable left upper lobe opacity as described above. Electronically  Signed   By: Marijo Conception M.D.   On: 06/04/2021 09:39     Imaging independently reviewed in Epic.    [facility] [phone] pager 06/06/2021, 5:53 AM

## 2021-06-06 NOTE — Procedures (Signed)
Progress/Procedural Note  Patient seen and examined in preop area.  Initially planned for I&D R leg subcutaneous abscess; however, given spontaneous rupture of abscess, I&D no longer indicated.  Pt still c/o R ankle pain, including some pain w/ weightbearing.  Had another discussion w/ pt about possibility of septic arthritis, and performing repeat aspiration to try to rule this out.  Pt agreeable to repeat aspiration.   Physical Examination RLE examination demonstrates spontaneously ruptured and draining cutaneous abscess of proximal medial leg.  Interval resolving erythema over leg.  Very mild/minimal ankle joint line and medial soft spot TTP.  No significant change in ankle ROM with good DF/PF with minimal pain, and no apparent micromotion discomfort.  Normal DF/PF/EHL strength.  SILT SP/DP/T.  2+ DP/PT, WWP.    Procedural Note Following a discussion of risks and benefits of right ankle aspiration, pt gave verbal consent.  Right ankle was confirmed as appropriate site.  Skin over anteromedial portal was anesthetized, and joint aspiration attempted with 18-gauge needle through the anteromedial portal.  Aspiration attempted along anterior joint line and medial gutter.  < 5 cc sanguinous fluid in syringe during withdrawal, sent for synovial labs.  Assessment/Plan: Low concern for R ankle septic arthritis at this time, given clinical findings and results of repeat aspiration.  Suspect soft tissue etiology of R ankle pain more likely (e.g. cellulitis). Recommend ongoing medical treatment in consult with Infectious Disease. R leg cutaneous abscess, now spontaneously draining.  Consider consult to wound management team for additional recommendations.  Ernestina Columbia M.D. Orthopaedic Surgery [facility]

## 2021-06-06 NOTE — Consult Note (Addendum)
[address]       [place] [address]             [phone]        SHARENE PINKHAM [facility] Medical Record #[id number] Date of Birth: December 14, 1990  Referring: Dr. Francine Graven Primary Care: Patient, No Pcp Per (Inactive) Primary Cardiologist:None  Chief Complaint:    Chief Complaint  Patient presents with   Abscess   History of Present Illness:      Sharon [name] is a 30 yo female with history of IV drug abuse, tobacco use, and anxiety.  She presented to the ED on 12/8 with complaints of an infected wound on her left leg.  She was also experiencing swelling of her right ankle.  She had been sick previously with fevers and cough.  She stated she was unable to cough anything up at that time.  Workup in the ED showed the patient to be mildly hypotensive and tachycardic.  She was treated with IV fluids.  CTA of the chest was obtained and ruled out PE but she was noted to have a cavitary pneumonia with associated loculated left pleural effusion.  She was also found to have leukocytosis.  Critical care consult was obtained for placement of a left sided pigtail catheter with placement of fibrinolytics.  Attempted aspiration of her right ankle was obtained in the ED w/o removal of fluid.  She was started on broad spectrum IV antibiotics and admitted to the hospital for further care.  The patient's ankle swelling improved with antibiotics.  LE duplex ruled out DVT.  The patient was noted to have a large abscess near her right knee which spontaneously drained on its owen. Orthopedic consult was obtained and they recommended patient have an arthrocentesis, however the patient declined this.  ID consult was obtained and they recommended Echocardiogram to assess for Endocarditis.  This showed no evidence of valvular abnormality or vegetation. Thoracic surgery consultation has been requested for her left sided pneumonia/empyema.  Currently the patient admits to feeling better overall.  She  feels like her breathing has improved since placement of chest tube.  Her lower extremity wounds also feel better and have improved overall since spontaneously draining.  She was using IV Heroin up until admission.  She is a current smoker.  Current Activity/ Functional Status: Patient is independent with mobility/ambulation, transfers, ADL's, IADL's.   Zubrod Score: At the time of surgery this patient's most appropriate activity status/level should be described as: [x]     0    Normal activity, no symptoms []     1    Restricted in physical strenuous activity but ambulatory, able to do out light work []     2    Ambulatory and capable of self care, unable to do work activities, up and about                 more than 50%  Of the time                            []     3    Only limited self care, in bed greater than 50% of waking hours []     4    Completely disabled, no self care, confined to bed or chair []     5    Moribund  Past Medical History:  Diagnosis Date   Anxiety    no meds  Arthritis    right lower arm tendonitis   Carpal tunnel syndrome    Headache(784.0)    otc med prn   IV drug abuse ([place])    Substance abuse ([place])     Past Surgical History:  Procedure Laterality Date   CESAREAN SECTION  05/16/2012   Procedure: CESAREAN SECTION;  Surgeon: Shelly Bombard, MD;  Location: [facility];  Service: Obstetrics;  Laterality: N/A;  Primary   CESAREAN SECTION N/A 04/02/2019   Procedure: CESAREAN SECTION;  Surgeon: Florian Buff, MD;  Location: [facility];  Service: Obstetrics;  Laterality: N/A;   CHEST TUBE INSERTION N/A 06/02/2021   Procedure: CHEST TUBE INSERTION;  Surgeon: Chesley Mires, MD;  Location: [facility];  Service: Pulmonary;  Laterality: N/A;   FOOT SURGERY  1997   removal foreign body (sewing needle)   TOOTH EXTRACTION  2008   upper incisor x2    Social History   Tobacco Use  Smoking Status Every Day   Packs/day: 1.00   Years: 4.00   Pack years: 4.00   Types:  Cigarettes  Smokeless Tobacco Former    Social History   Substance and Sexual Activity  Alcohol Use No   Comment: ocassionaly    No Known Allergies  Current Facility-Administered Medications  Medication Dose Route Frequency Provider Last Rate Last Admin   acetaminophen (TYLENOL) tablet 650 mg  650 mg Oral Q6H PRN Lequita Halt, MD       Or   acetaminophen (TYLENOL) suppository 650 mg  650 mg Rectal Q6H PRN Wynetta Fines T, MD       acidophilus (RISAQUAD) capsule 2 capsule  2 capsule Oral Daily Pham, Minh Q, RPH-CPP   2 capsule at 06/05/21 0814   ALPRAZolam (XANAX) tablet 0.5 mg  0.5 mg Oral TID PRN Patrecia Pour, MD   0.5 mg at 06/06/21 1425   ceFEPIme (MAXIPIME) 2 g in sodium chloride 0.9 % 100 mL IVPB  2 g Intravenous Q8H Carney, Jessica C, RPH 200 mL/hr at 06/06/21 0521 2 g at 06/06/21 0521   cloNIDine (CATAPRES) tablet 0.1 mg  0.1 mg Oral TID PRN Wynetta Fines T, MD   0.1 mg at 06/05/21 2032   enoxaparin (LOVENOX) injection 40 mg  40 mg Subcutaneous Q24H Wynetta Fines T, MD   40 mg at 06/06/21 1426   guaiFENesin (MUCINEX) 12 hr tablet 1,200 mg  1,200 mg Oral BID Wynetta Fines T, MD   1,200 mg at 06/05/21 2034   hydrOXYzine (VISTARIL) injection 25 mg  25 mg Intramuscular Q6H PRN Wynetta Fines T, MD       ipratropium-albuterol (DUONEB) 0.5-2.5 (3) MG/3ML nebulizer solution 3 mL  3 mL Nebulization Q6H PRN Wynetta Fines T, MD       ketorolac (TORADOL) 30 MG/ML injection 30 mg  30 mg Intravenous Q6H PRN Wynetta Fines T, MD   30 mg at 06/05/21 2035   nicotine (NICODERM CQ - dosed in mg/24 hours) patch 21 mg  21 mg Transdermal Daily Wynetta Fines T, MD   21 mg at 06/06/21 [id number]   sodium chloride flush (NS) 0.9 % injection 10 mL  10 mL Other Q8H Erick Colace, NP   10 mL at 06/06/21 1100   sodium chloride flush (NS) 0.9 % injection 10 mL  10 mL Other Q8H Minor, Grace Bushy, NP   10 mL at 06/06/21 0445   vancomycin variable dose per unstable renal function (pharmacist dosing)   Does not apply See admin  instructions Kris Mouton, [facility]        Medications Prior to Admission  Medication Sig Dispense Refill Last Dose   ALPRAZolam (XANAX) 1 MG tablet Take 1 mg by mouth at bedtime as needed for anxiety.   Past Week   ibuprofen (ADVIL) 200 MG tablet Take 200 mg by mouth every 6 (six) hours as needed for moderate pain.   Past Week   ibuprofen (ADVIL) 600 MG tablet Take 1 tablet (600 mg total) by mouth every 6 (six) hours. (Patient not taking: Reported on 06/02/2021) 60 tablet 1 Completed Course   senna-docusate (SENOKOT-S) 8.6-50 MG tablet Take 2 tablets by mouth daily. (Patient not taking: Reported on 05/28/2019) 20 tablet 0 Completed Course    Family History  Problem Relation Age of Onset   Seizures Father    Diabetes Other    CAD Other    Hypertension Other    Anesthesia problems Neg Hx    Hypotension Neg Hx    Malignant hyperthermia Neg Hx    Pseudochol deficiency Neg Hx    Review of Systems:   ROS    Cardiac Review of Systems: Y or  [    ]= no  Chest Pain [ Y, improved   ]  Resting SOB [   ] Exertional SOB  [  ]  Orthopnea [  ]   Pedal Edema [   ]    Palpitations [  ] Syncope  [  ]   Presyncope [   ]  General Review of Systems: [Y] = yes [  ]=no Constitional: recent weight change [  ]; anorexia [  ]; fatigue [  ]; nausea [  ]; night sweats [  ]; fever [ Y ]; or chills Jazmín.Cullens  ]                                                               Dental: Last Dentist visit:   Eye : blurred vision [  ]; diplopia [   ]; vision changes [  ];  Amaurosis fugax[  ]; Resp: cough [ Y ];  wheezing[  ];  hemoptysis[ N ]; shortness of breath[ Y, improved ]; paroxysmal nocturnal dyspnea[  ]; dyspnea on exertion[  ]; or orthopnea[  ];  GI:  gallstones[  ], vomiting[  ];  dysphagia[  ]; melena[  ];  hematochezia [  ]; heartburn[  ];   Hx of  Colonoscopy[  ]; GU: kidney stones [  ]; hematuria[  ];   dysuria [  ];  nocturia[  ];  history of     obstruction [  ]; urinary frequency [  ]             Skin: rash,  swelling[ X ];, hair loss[  ];  peripheral edema[  ];  or itching[  ]; patient with open sores along right and left leg,  Musculosketetal: myalgias[  ];  joint swelling[ Y ];  joint erythema[  ];  joint pain[  ];  back pain[  ];  Heme/Lymph: bruising[  ];  bleeding[  ];  anemia[  ];  Neuro: TIA[  ];  headaches[  ];  stroke[  ];  vertigo[  ];  seizures[  ];   paresthesias[  ];  difficulty walking[ N ];  Psych:depression[  ]; anxiety[  ];  Endocrine: diabetes[  ];  thyroid dysfunction[  ];  Physical Exam: BP 112/64 (BP Location: Right Arm)   Pulse 85   Temp (P) 98 F (36.7 C) (Oral)   Resp 16   Ht 5\' 3"  (1.6 m)   Wt 85.7 kg   LMP 05/13/2021 (Approximate) Comment: per MD shield and shoot.  Nerg preg tes this admission  SpO2 97%   BMI 33.47 kg/m   General appearance: alert, cooperative, and no distress Head: Normocephalic, without obvious abnormality, atraumatic Resp: diminished on the left Cardio: regular rate and rhythm GI: soft, non-tender; bowel sounds normal; no masses,  no organomegaly Extremities: bilateral erythema along lower extremities below knee, open draining sore on right and left leg near knee Neurologic: Grossly normal  Diagnostic Studies & Laboratory data:     Recent Radiology Findings:   DG Chest Port 1 View  Result Date: 06/06/2021 CLINICAL DATA:  30 year old female status post left thoracostomy tube placement. EXAM: PORTABLE CHEST - 1 VIEW COMPARISON:  06/05/2021, 06/04/2021, 06/02/2021 FINDINGS: Persistent obscuration of the left inferior mediastinal silhouette. Unchanged heart size. Unchanged position of indwelling left basilar pigtail thoracostomy tube. Similar to slightly decreased prominence of rounded opacity centered about the aortopulmonic window. The right lung is clear. No evidence of pneumothorax. No cardiomegaly. No acute osseous abnormality. IMPRESSION: Similar appearing small left pleural effusion and left basilar atelectasis with indwelling left  pigtail thoracostomy tube. Similar appearance of loculated/fissural upper lobe pleural effusion. Electronically Signed   By: Ruthann Cancer M.D.   On: 06/06/2021 08:00   DG Chest Port 1 View  Result Date: 06/05/2021 CLINICAL DATA:  30 year old female with shortness of breath, chest pain EXAM: PORTABLE CHEST 1 VIEW COMPARISON:  Prior chest x-ray 06/04/2021 FINDINGS: Left-sided pigtail thoracostomy tube in unchanged position. Persistent loculated fluid within the fissure and the lateral periphery of the lung. The right lung remains clear. Cardiac and mediastinal contours are unchanged. No pneumothorax. No acute osseous abnormality. IMPRESSION: Stable position of left-sided pigtail thoracostomy tube with residual loculated effusion. Some fluid is trapped within the fissure. Right lung remains clear.  No new or acute abnormality. Electronically Signed   By: Jacqulynn Cadet M.D.   On: 06/05/2021 08:07     I have independently reviewed the above radiologic studies and discussed with the patient   Recent Lab Findings: Lab Results  Component Value Date   WBC 7.0 06/05/2021   HGB 8.2 (L) 06/05/2021   HCT 27.4 (L) 06/05/2021   PLT 413 (H) 06/05/2021   GLUCOSE 135 (H) 06/05/2021   ALT QUANTITY NOT SUFFICIENT, UNABLE TO PERFORM TEST 06/03/2021   AST 27 06/03/2021   NA 134 (L) 06/05/2021   K 3.9 06/05/2021   CL 106 06/05/2021   CREATININE 0.47 06/05/2021   BUN 10 06/05/2021   CO2 24 06/05/2021   INR 1.2 06/02/2021   Assessment / Plan:      Necrotizing Pneumonia with Empyema- Pigtail in place, thrombolytics have been instilled by CCM Bilateral LE abscesses/cellulitis- on ABS per ID recommendations Current IV drug abuse- Echocardiogram w/o evidence of endocarditis Nicotine abuse Dispo- patient stable, continue IV Abx, Dr. Kipp Brood to review CT scan and determine if surgical intervention is indiciated  I  spent 55 minutes counseling the patient face to face.   Erin Barrett, PA-C 06/06/2021  2:26 PM   Agree with above. This is a 30 year old female with history of a necrotizing pneumonia and a loculated  left pleural effusion.  She is undergone chest tube placement with thrombolytic therapy.  She also has multiple abscesses to bilateral lower extremities.  Her symptoms have improved since placement of the chest tube.  Would recommend repeat cross-sectional imaging to assess the size of the loculated effusion.  Catelyn Friel Keane Scrape

## 2021-06-06 NOTE — Progress Notes (Signed)
PROGRESS NOTE  Sharon [name]  OHY:[id number] DOB: April 23, 1991 DOA: 06/02/2021 PCP: Patient, No Pcp Per (Inactive)   Brief Narrative: Sharon [name] is a 30 y.o. female with a history of IVDU, tobacco use, anxiety who presented to the ED on 12/8 with infected wound of the left leg, painful swelling of the right ankle, and fever with cough. She was tachycardic with borderline hypotension, given IVF. WBC 20k. CTA chest revealed cavitary pneumonia with associated loculated left pleural effusion. PCCM was consulted, placed pigtail catheter and is proceeding with fibrinolytic. Right ankle was tapped with no fluid return in ED. Swelling has improved with IV antibiotics alone, so orthopedics not currently recommending surgical management. Blood culture and pleural fluid culture are pending, echocardiogram ordered. ID guiding antibiotics, changed now to vancomycin and cefepime. Additional abscess on MR of right leg which has ruptured prior to I&D. Right ankle arthrocentesis performed 12/12. Cardiothoracic surgery is consulted for empyema, consideration of VATS.  Assessment & Plan: Principal Problem:   Sepsis (HCC) Active Problems:   IV drug abuse (HCC)   Substance abuse (HCC)   Empyema lung (HCC)  Sepsis due to multiple infections, concern for bacteremia in IVDU:  - Continue vancomycin, cefepime  - Monitor blood cultures (NGTD x3 days on admission, and 2 sets NGTD 2 days) - Echocardiogram (TTE) without vegetations.  Cavitary lingula pneumonia and empyema:  - Appreciate PCCM management s/p chest tube 12/8, instilling lytics prn (12/9, 12/11). CT surgery consulted today per PCCM recommendations. - F/u pleural fluid culture (NG1D)  Right LE abscesses and cellulitis, possible right ankle septic arthritis: LE venous U/S negative for DVT - Pt declined orthopedic's recommendation for arthrocentesis until 12/12, will follow up study. No organism on gram stain. - RLE abscess spontaneously draining as of  12/12 prior to operative I&D. Continue wound care.   Right axillary stranding: Unclear significance. No complaints from patient, though there is some AC erythema and pt injects in UE's.   - MR right humerus  Left calf abscess: Spontaneously draining.  - Continue local wound care and abx as above.  IVDU, narcotic use: HIV NR, hep B Ab+, Ag - (immune) - Monitor for withdrawal.  - Clonidine prn - Cessation counseling provided  Tobacco use:  - Cessation counseling - Nicotine patch  Microcytic anemia: Exacerbated by IVF causing hemodilution. No active bleeding noted.  - Hgb improved with transfusion 1u PRBC 12/9. Continue monitoring.   Hypoalbuminemia: Suspected malnutrition.  - Dietitian consulted.   Hypokalemia: Resolved  Anxiety:  - Xanax prn, increase dose to 0.5mg . Continue clonidine.  Obesity: Estimated body mass index is 33.47 kg/m as calculated from the following:   Height as of this encounter: 5\' 3"  (1.6 m).   Weight as of this encounter: 85.7 kg.  DVT prophylaxis: Lovenox Code Status: Full Family Communication: Boyfriend at bedside this AM Disposition Plan:  Status is: Inpatient  Remains inpatient appropriate because: continued work up of multiple infections, requiring IV abx.  Consultants:  PCCM Orthopedics Infectious diseases  Procedures:  Left pigtail catheter/chest tube placement 06/02/2021 Right ankle arthrocentesis 06/06/2021  Antimicrobials: Vancomycin cefepime >>  Flagyl 12/8 - 12/9   Subjective: Pain improved in right leg after abscess began spontaneously draining last night. Still severe when area is manipulated. No shortness of breath or anterior chest pain. No fevers. Swelling in legs going down.  Objective: Vitals:   06/06/21 0821 06/06/21 0932 06/06/21 1103 06/06/21 1108  BP: 107/69 (!) 109/56  112/64  Pulse: 78 77 (P) 88 85  Resp: 15 17 (P) 19 16  Temp: 98 F (36.7 C) 98.1 F (36.7 C) (P) 98 F (36.7 C)   TempSrc: Oral Oral (P)  Oral   SpO2: 100% 94% (P) 96% 97%  Weight:      Height:        Intake/Output Summary (Last 24 hours) at 06/06/2021 1350 Last data filed at 06/06/2021 0900 Gross per 24 hour  Intake 1100 ml  Output 80 ml  Net 1020 ml   Filed Weights   06/02/21 0036 06/02/21 0456 06/02/21 1331  Weight: 89.8 kg 86.4 kg 85.7 kg   Gen: 30 y.o. female in no distress Pulm: Nonlabored breathing room air. Clear, diminished on left. CV: Regular rate and rhythm. No new murmur, rub, or gallop. No JVD, decreased RLE edema. GI: Abdomen soft, non-tender, non-distended, with normoactive bowel sounds.  Ext: Warm, no deformities Skin: Right leg medial to tibial plateau now with deflated area of fluctuance that is now draining foul-smelling exudate.  Neuro: Alert and oriented. No focal neurological deficits. Psych: Judgement and insight appear fair. Mood euthymic & affect congruent. Behavior is appropriate.    Data Reviewed: I have personally reviewed following labs and imaging studies  CBC: Recent Labs  Lab 06/02/21 0221 06/03/21 0152 06/03/21 1229 06/03/21 2340 06/05/21 0428 06/05/21 2313  WBC 20.5* 12.5*  --  8.5 6.9 7.0  NEUTROABS 15.4*  --   --  5.4 4.7  --   HGB 8.7* 7.1* 6.4* 8.1* 8.5* 8.2*  HCT 28.8* 23.5* 22.0* 26.1* 28.0* 27.4*  MCV 74.2* 75.1*  --  75.4* 77.1* 79.0*  PLT 489* 407*  --  420* 492* 413*   Basic Metabolic Panel: Recent Labs  Lab 06/02/21 0221 06/03/21 0152 06/03/21 2340 06/05/21 0428 06/05/21 2313  NA 131* 133* 135 139 134*  K 3.2* 4.2 4.4 3.7 3.9  CL 95* 101 105 107 106  CO2 26 26 22 24 24   GLUCOSE 108* 102* 102* 120* 135*  BUN 10 8 10 6 10   CREATININE 0.69 0.56 0.49 0.61 0.47  CALCIUM 7.8* 7.4* 7.6* 8.0* 7.6*  MG  --  1.7  --  2.0 1.8  PHOS  --   --   --  3.1 3.8   GFR: Estimated Creatinine Clearance: 106.6 mL/min (by C-G formula based on SCr of 0.47 mg/dL). Liver Function Tests: Recent Labs  Lab 06/02/21 0221 06/03/21 2340  AST 29 27  ALT 21 QUANTITY NOT  SUFFICIENT, UNABLE TO PERFORM TEST  ALKPHOS 106 69  BILITOT 1.8* QUANTITY NOT SUFFICIENT, UNABLE TO PERFORM TEST  PROT 8.4* 6.2*  ALBUMIN 2.0* <1.5*   No results for input(s): LIPASE, AMYLASE in the last 168 hours. No results for input(s): AMMONIA in the last 168 hours. Coagulation Profile: Recent Labs  Lab 06/02/21 0450  INR 1.2   Cardiac Enzymes: No results for input(s): CKTOTAL, CKMB, CKMBINDEX, TROPONINI in the last 168 hours. BNP (last 3 results) No results for input(s): PROBNP in the last 8760 hours. HbA1C: No results for input(s): HGBA1C in the last 72 hours. CBG: No results for input(s): GLUCAP in the last 168 hours. Lipid Profile: No results for input(s): CHOL, HDL, LDLCALC, TRIG, CHOLHDL, LDLDIRECT in the last 72 hours. Thyroid Function Tests: No results for input(s): TSH, T4TOTAL, FREET4, T3FREE, THYROIDAB in the last 72 hours. Anemia Panel: No results for input(s): VITAMINB12, FOLATE, FERRITIN, TIBC, IRON, RETICCTPCT in the last 72 hours.  Urine analysis:    Component Value Date/Time   COLORURINE AMBER (A)  06/02/2021 0745   APPEARANCEUR HAZY (A) 06/02/2021 0745   LABSPEC 1.020 06/02/2021 0745   PHURINE 5.5 06/02/2021 0745   GLUCOSEU NEGATIVE 06/02/2021 0745   HGBUR TRACE (A) 06/02/2021 0745   BILIRUBINUR MODERATE (A) 06/02/2021 0745   KETONESUR NEGATIVE 06/02/2021 0745   PROTEINUR 30 (A) 06/02/2021 0745   NITRITE POSITIVE (A) 06/02/2021 0745   LEUKOCYTESUR NEGATIVE 06/02/2021 0745   Recent Results (from the past 240 hour(s))  Resp Panel by RT-PCR (Flu A&B, Covid) Nasopharyngeal Swab     Status: None   Collection Time: 06/02/21  4:50 AM   Specimen: Nasopharyngeal Swab; Nasopharyngeal(NP) swabs in vial transport medium  Result Value Ref Range Status   SARS Coronavirus 2 by RT PCR NEGATIVE NEGATIVE Final    Comment: (NOTE) SARS-CoV-2 target nucleic acids are NOT DETECTED.  The SARS-CoV-2 RNA is generally detectable in upper respiratory specimens during  the acute phase of infection. The lowest concentration of SARS-CoV-2 viral copies this assay can detect is 138 copies/mL. A negative result does not preclude SARS-Cov-2 infection and should not be used as the sole basis for treatment or other patient management decisions. A negative result may occur with  improper specimen collection/handling, submission of specimen other than nasopharyngeal swab, presence of viral mutation(s) within the areas targeted by this assay, and inadequate number of viral copies(<138 copies/mL). A negative result must be combined with clinical observations, patient history, and epidemiological information. The expected result is Negative.  Fact Sheet for Patients:  BloggerCourse.com  Fact Sheet for Healthcare Providers:  SeriousBroker.it  This test is no t yet approved or cleared by the [place] FDA and  has been authorized for detection and/or diagnosis of SARS-CoV-2 by FDA under an Emergency Use Authorization (EUA). This EUA will remain  in effect (meaning this test can be used) for the duration of the COVID-19 declaration under Section 564(b)(1) of the Act, 21 U.S.C.section 360bbb-3(b)(1), unless the authorization is terminated  or revoked sooner.       Influenza A by PCR NEGATIVE NEGATIVE Final   Influenza B by PCR NEGATIVE NEGATIVE Final    Comment: (NOTE) The Xpert Xpress SARS-CoV-2/FLU/RSV plus assay is intended as an aid in the diagnosis of influenza from Nasopharyngeal swab specimens and should not be used as a sole basis for treatment. Nasal washings and aspirates are unacceptable for Xpert Xpress SARS-CoV-2/FLU/RSV testing.  Fact Sheet for Patients: BloggerCourse.com  Fact Sheet for Healthcare Providers: SeriousBroker.it  This test is not yet approved or cleared by the [place] FDA and has been authorized for detection and/or  diagnosis of SARS-CoV-2 by FDA under an Emergency Use Authorization (EUA). This EUA will remain in effect (meaning this test can be used) for the duration of the COVID-19 declaration under Section 564(b)(1) of the Act, 21 U.S.C. section 360bbb-3(b)(1), unless the authorization is terminated or revoked.  Performed at [facility], [address] [place], [place] [address]   Blood Culture (routine x 2)     Status: None (Preliminary result)   Collection Time: 06/02/21  4:50 AM   Specimen: Left Antecubital; Blood  Result Value Ref Range Status   Specimen Description   Final    LEFT ANTECUBITAL Performed at [facility], [address] [place], [place] [address]    Special Requests   Final    BOTTLES DRAWN AEROBIC AND ANAEROBIC Blood Culture adequate volume Performed at [facility], [address], [place], [place] [address]  Culture   Final    NO GROWTH 4 DAYS Performed at [facility], [address] [place], [place] [address]    Report Status PENDING  Incomplete  Urine Culture     Status: Abnormal   Collection Time: 06/02/21  7:45 AM   Specimen: In/Out Cath Urine  Result Value Ref Range Status   Specimen Description   Final    IN/OUT CATH URINE Performed at [facility], [address] [place], [place] [address]    Special Requests   Final    NONE Performed at [facility], [address] [place], [place] [address]    Culture MULTIPLE SPECIES PRESENT, SUGGEST RECOLLECTION (A)  Final   Report Status 06/03/2021 FINAL  Final  Body fluid culture w Gram Stain     Status: None (Preliminary result)   Collection Time: 06/02/21  3:01 PM   Specimen: Pleural Fluid  Result Value Ref Range Status   Specimen Description PLEURAL FLUID LEFT  Final   Special Requests NONE  Final   Gram Stain   Final    FEW WBC PRESENT, PREDOMINANTLY PMN NO ORGANISMS SEEN    Culture   Final    NO GROWTH 1 DAY Performed at [facility], [address] [place], [place] [address]    Report Status PENDING  Incomplete  Culture, blood (Routine X 2) w Reflex to ID Panel     Status: None (Preliminary result)   Collection Time: 06/03/21 12:21 PM   Specimen: BLOOD LEFT HAND  Result Value Ref Range Status   Specimen Description BLOOD LEFT HAND  Final   Special Requests   Final    BOTTLES DRAWN AEROBIC AND ANAEROBIC Blood Culture results may not be optimal due to an inadequate volume of blood received in culture bottles   Culture   Final    NO GROWTH 3 DAYS Performed at [facility], [address], [place], [place] [address]    Report Status PENDING  Incomplete  Culture, blood (Routine X 2) w Reflex to ID Panel     Status: None (Preliminary result)   Collection Time: 06/03/21 12:31 PM   Specimen: BLOOD LEFT HAND  Result Value Ref Range Status   Specimen Description BLOOD LEFT HAND  Final   Special Requests   Final    BOTTLES DRAWN AEROBIC ONLY Blood Culture results may not be optimal due to an inadequate volume of blood received in culture bottles   Culture   Final    NO GROWTH 3 DAYS Performed at [facility], [address], [place], [place] [address]    Report Status PENDING  Incomplete  Surgical PCR screen     Status: None   Collection Time: 06/05/21  9:49 PM   Specimen: Nasal Mucosa; Nasal Swab  Result Value Ref Range Status   MRSA, PCR NEGATIVE NEGATIVE Final   Staphylococcus aureus NEGATIVE NEGATIVE Final    Comment: (NOTE) The Xpert SA Assay (FDA approved for NASAL specimens in patients 70 years of age and older), is one component of a comprehensive surveillance program. It is not intended to diagnose infection nor to guide or monitor treatment. Performed at [facility], [address], [place], [place] [address]   Body fluid culture w Gram Stain     Status: None (Preliminary result)   Collection Time: 06/06/21 11:38 AM   Specimen: Synovium; Synovial Fluid  Result Value Ref  Range Status   Specimen Description SYNOVIAL FLUID  Final   Special Requests RIGHT ANKLE  Final   Gram Stain   Final    RARE WBC PRESENT, PREDOMINANTLY MONONUCLEAR NO ORGANISMS SEEN Performed at [facility], [address] [place], [place] [address]    Culture PENDING  Incomplete   Report Status PENDING  Incomplete      Radiology Studies: DG Chest Port 1 View  Result Date: 06/06/2021 CLINICAL DATA:  30 year old female status post left thoracostomy tube placement. EXAM: PORTABLE CHEST - 1 VIEW COMPARISON:  06/05/2021, 06/04/2021, 06/02/2021 FINDINGS: Persistent obscuration of the left inferior mediastinal silhouette. Unchanged heart size. Unchanged position of indwelling left basilar pigtail thoracostomy tube. Similar to slightly decreased prominence of rounded opacity centered about the aortopulmonic window. The right lung is clear. No evidence of pneumothorax. No cardiomegaly. No acute osseous abnormality. IMPRESSION: Similar appearing small left pleural effusion and left basilar atelectasis with indwelling left pigtail thoracostomy tube. Similar appearance of loculated/fissural upper lobe pleural effusion. Electronically Signed   By: Marliss Coots M.D.   On: 06/06/2021 08:00   DG Chest Port 1 View  Result Date: 06/05/2021 CLINICAL DATA:  30 year old female with shortness of breath, chest pain EXAM: PORTABLE CHEST 1 VIEW COMPARISON:  Prior chest x-ray 06/04/2021 FINDINGS: Left-sided pigtail thoracostomy tube in unchanged position. Persistent loculated fluid within the fissure and the lateral periphery of the lung. The right lung remains clear. Cardiac and mediastinal contours are unchanged. No pneumothorax. No acute osseous abnormality. IMPRESSION: Stable position of left-sided pigtail thoracostomy tube with residual loculated effusion. Some fluid is trapped within the fissure. Right lung remains clear.  No new or acute abnormality. Electronically Signed   By: Malachy Moan M.D.    On: 06/05/2021 08:07    Scheduled Meds:  acidophilus  2 capsule Oral Daily   enoxaparin (LOVENOX) injection  40 mg Subcutaneous Q24H   guaiFENesin  1,200 mg Oral BID   nicotine  21 mg Transdermal Daily   sodium chloride flush  10 mL Other Q8H   sodium chloride flush  10 mL Other Q8H   vancomycin variable dose per unstable renal function (pharmacist dosing)   Does not apply See admin instructions   Continuous Infusions:  ceFEPime (MAXIPIME) IV 2 g (06/06/21 0521)     LOS: 4 days   Time spent: 35 minutes.  Tyrone Nine, MD [facility] [email or web address] 06/06/2021, 1:50 PM

## 2021-06-06 NOTE — Progress Notes (Signed)
NAME:  Sharon [name], MRN:  [id number], DOB:  [birth year], LOS: 4 ADMISSION DATE:  06/02/2021, CONSULTATION DATE:  06/02/2021 REFERRING MD:  Dr. 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  Found to have abscess on Lt leg.  She had chest xray that showed large left pleural effusion.  CT chest showed area of cavitation in the inferior lingula and large Lt sided pleural effusion.  PCCM asked to assess for drainage of pleural effusion.  Pertinent  Medical History  Anxiety, OA, Carpal tunnel, IVDA, Headache  Significant Hospital Events: Including procedures, antibiotic start and stop dates in addition to other pertinent events   12/08 Admit, ortho consulted, start ABx, Lt pig tail catheter placed, start tPA/dornase  Studies:  CT angio chest 06/02/21 >> large complex Lt effusion with pleural enhancement with partial loculation, ATX LLL, cavitary area lingula, splenomegaly Lt pleural fluid 06/02/21 >> glucose less than 20, protein 5.4, LDH 4696, 10625 cells (92% neutrophils) MRI12/02/2021 1. Rim-enhancing abscess within the medial subcutaneous soft tissues at the level of the tibial plateau measuring 3.6 x 2.0 x 2.9 cm. 2. Mild diffuse intramuscular edema involving the anterior and deep posterior compartments of the lower leg with associated perifascial edema, compatible with myofasciitis. No evidence of myonecrosis or pyomyositis. 3. Mild tenosynovitis associated with the posteromedial ankle tendons, which may be reactive or infectious. mri No evidence of osteomyelitis. 06/05/2021 tPA administered  Interim History / Subjective:   No acute events overnight. She had right ankle aspirated this morning. Breathing is better overall.   Objective   Blood pressure 108/62, pulse 90, temperature 98 F (36.7 C), temperature source Oral, resp. rate 15, height 5\' 3"  (1.6 m),  weight 85.7 kg, last menstrual period 05/13/2021, SpO2 97 %.        Intake/Output Summary (Last 24 hours) at 06/06/2021 1714 Last data filed at 06/06/2021 1300 Gross per 24 hour  Intake 1340 ml  Output 20 ml  Net 1320 ml   Filed Weights   06/02/21 0036 06/02/21 0456 06/02/21 1331  Weight: 89.8 kg 86.4 kg 85.7 kg    Examination:  General: no acute distress HEENT: MM pink/moist Neuro: awake, alert, following commands CV: rrr, no murmur PULM: Decreased breath sounds in the bases GI: soft, bsx4 active  Extremities: warm/dry Skin: Cellulitis lower extremity    Resolved Hospital Problem list     Assessment & Plan:   Lt pleural space empyema with cavitary pneumonia in setting of IVDA and Leg abscess and cellulitis. - Pigtail placed 06/02/2021 - tPA started 06/02/2021 with repeat doses given on 12/9 and 12/11.  - Chest x-ray with persistent left pleural effusion at base along with loculated/fissural left upper lobe effusion - recommend CT surgery evaluation for possible VATs given lack of significant improvement with 3 doses of pleural lytic therapy - may need repeat CT chest scan   Best Practice (right click and "Reselect all SmartList Selections" daily)   Per primary Labs    CMP Latest Ref Rng & Units 06/05/2021 06/05/2021 06/03/2021  Glucose 70 - 99 mg/dL 14/02/2021) 371(G) 626(R)  BUN 6 - 20 mg/dL 10 6 10   Creatinine 0.44 - 1.00 mg/dL 485(I 6.27  Sodium 135 - 145 mmol/L 134(L) 139 135  Potassium 3.5 - 5.1 mmol/L 3.9 3.7 4.4  Chloride 98 - 111 mmol/L 106 107 105  CO2 22 - 32  mmol/L 24 24 22   Calcium 8.9 - 10.3 mg/dL 7.6(L) 8.0(L) 7.6(L)  Total Protein 6.5 - 8.1 g/dL - - 6.2(L)  Total Bilirubin 0.3 - 1.2 mg/dL - - QUANTITY NOT SUFFICIENT, UNABLE TO PERFORM TEST  Alkaline Phos 38 - 126 U/L - - 69  AST 15 - 41 U/L - - 27  ALT 0 - 44 U/L - - QUANTITY NOT SUFFICIENT, UNABLE TO PERFORM TEST    CBC Latest Ref Rng & Units 06/05/2021 06/05/2021 06/03/2021  WBC 4.0 - 10.5  K/uL 7.0 6.9 8.5  Hemoglobin 12.0 - 15.0 g/dL 8.2(L) 8.5(L) 8.1(L)  Hematocrit 36.0 - 46.0 % 27.4(L) 28.0(L) 26.1(L)  Platelets 150 - 400 K/uL 413(H) 492(H) 420(H)    Signature:   Freda Jackson, MD [facility] Office: [phone]   See Amion for personal pager PCCM on call pager [phone] until 7pm. Please call Elink 7p-7a. [phone]

## 2021-06-06 NOTE — Anesthesia Preprocedure Evaluation (Deleted)
Anesthesia Evaluation  Patient identified by MRN, date of birth, ID band Patient awake    Reviewed: Allergy & Precautions, NPO status , Patient's Chart, lab work & pertinent test results  Airway Mallampati: I  TM Distance: >3 FB Neck ROM: Full    Dental   Pulmonary Current Smoker and Patient abstained from smoking.,  Empyema left lung    Pulmonary exam normal breath sounds clear to auscultation       Cardiovascular negative cardio ROS Normal cardiovascular exam Rhythm:Regular Rate:Normal     Neuro/Psych  Headaches, Anxiety  Neuromuscular disease    GI/Hepatic negative GI ROS, (+)     substance abuse  IV drug use,   Endo/Other  negative endocrine ROS  Renal/GU negative Renal ROS     Musculoskeletal  (+) Arthritis ,   Abdominal   Peds  Hematology  (+) anemia ,   Anesthesia Other Findings Right Lower Leg Abscess  Reproductive/Obstetrics hcg negative                            Anesthesia Physical Anesthesia Plan  ASA: 2  Anesthesia Plan: General   Post-op Pain Management:    Induction: Intravenous  PONV Risk Score and Plan: 2 and Ondansetron, Dexamethasone, Midazolam and Treatment may vary due to age or medical condition  Airway Management Planned: LMA  Additional Equipment:   Intra-op Plan:   Post-operative Plan: Extubation in OR  Informed Consent: I have reviewed the patients History and Physical, chart, labs and discussed the procedure including the risks, benefits and alternatives for the proposed anesthesia with the patient or authorized representative who has indicated his/her understanding and acceptance.     Dental advisory given  Plan Discussed with: CRNA  Anesthesia Plan Comments:         Anesthesia Quick Evaluation

## 2021-06-06 NOTE — Progress Notes (Signed)
Notified by lab that synovial fluid sample wasn't able to be utilized due to clotting off when attempting to pull from vial. Notified ortho PA. No new orders. Sharon [name]

## 2021-06-06 NOTE — Consult Note (Signed)
WOC Nurse Consult Note: Patient receiving care in [facility] [phone] Reason for Consult: Bilateral open lower extremity abscesses Wound type: Ruptured abscess on the right medial/posterior knee with serous drainage, very painful Left calf abscess appears to have had an I & D, pink, draining yellow/green drainage with granular tissue in the wound. Pressure Injury POA: NA Measurement: Right side 2 x 1.5, Left side 2 x 2 x 0.6 Drainage (amount, consistency, odor)  Periwound: erythema surrounding both wounds Dressing procedure/placement/frequency: Cleanse the areas with NS. Place a small piece of Xeroform gauze over the wounds, cover with 4 x 4s and wrap with Kerlix. Change daily.  Monitor the wound area(s) for worsening of condition such as: Signs/symptoms of infection, increase in size, development of or worsening of odor, development of pain, or increased pain at the affected locations.   Notify the medical team if any of these develop.  Thank you for the consult. WOC nurse will not follow at this time.   Please re-consult the WOC team if needed.  Renaldo Reel Katrinka Blazing, MSN, RN, CMSRN, Angus Seller, [facility] Wound Treatment Associate Pager [phone]

## 2021-06-06 NOTE — Progress Notes (Signed)
  Transition of Care (TOC) Screening Note   Patient Details  Name: Sharon [name] Date of Birth: [birth year]   Transition of Care [facility]) CM/SW Contact:    Darrold Span, RN Phone Number: 06/06/2021, 12:09 PM    Transition of Care Department [facility]) has reviewed patient and no TOC needs have been identified at this time-Continued Medical workup. We will continue to monitor patient advancement through interdisciplinary progression rounds. If new patient transition needs arise, please place a TOC consult.

## 2021-06-06 NOTE — Progress Notes (Signed)
Dr. Sherilyn Dacosta aspirated R ankle at bedside specimen sent to lab.

## 2021-06-07 ENCOUNTER — Inpatient Hospital Stay (HOSPITAL_COMMUNITY): Payer: Medicaid Other

## 2021-06-07 LAB — ACID FAST SMEAR (AFB, MYCOBACTERIA): Acid Fast Smear: NEGATIVE

## 2021-06-07 LAB — CULTURE, BLOOD (ROUTINE X 2)
Culture: NO GROWTH
Special Requests: ADEQUATE

## 2021-06-07 IMAGING — CT CT CHEST W/O CM
2 of 4 series · 15 of 36 positions shown, 18 images · non-contrast
Comparison: Chest CT [DATE].

CLINICAL DATA: 30-year-old female with history of empyema.

EXAM:
CT CHEST WITHOUT CONTRAST
TECHNIQUE: Multidetector CT imaging of the chest was performed following the
standard protocol without IV contrast.

[Series 4: thorax 2.0 · axial · 0.68mm/px · z∈[+1204,+1442]mm · 12 of 133 slices shown, 15 images]
[im 7/133  mediastinal]
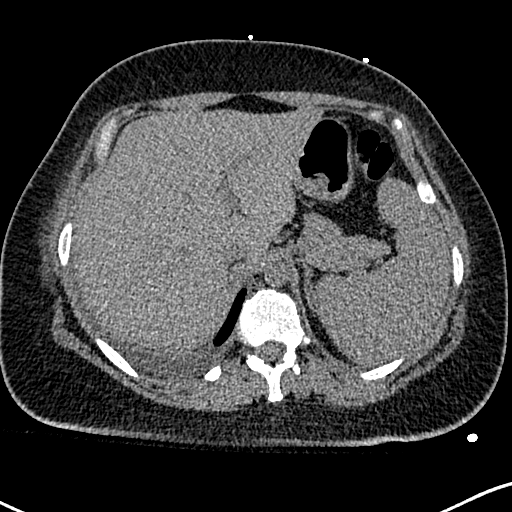
[im 7/133  lung]
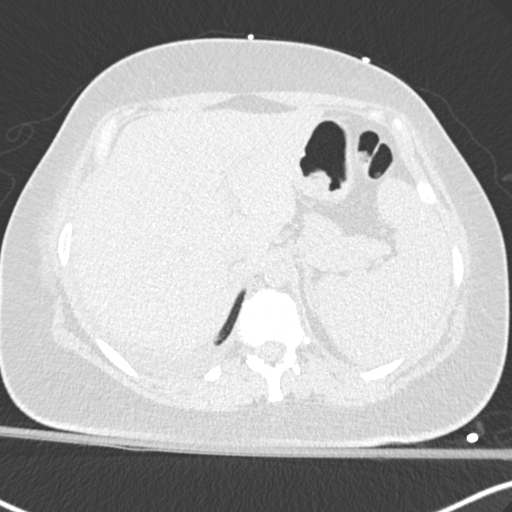
[im 21/133  lung]
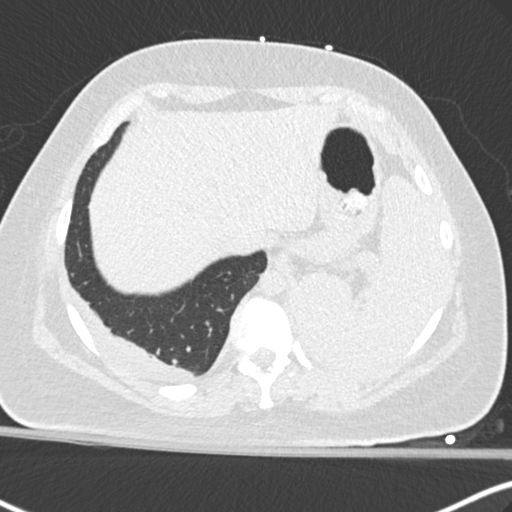
[im 28/133  lung]
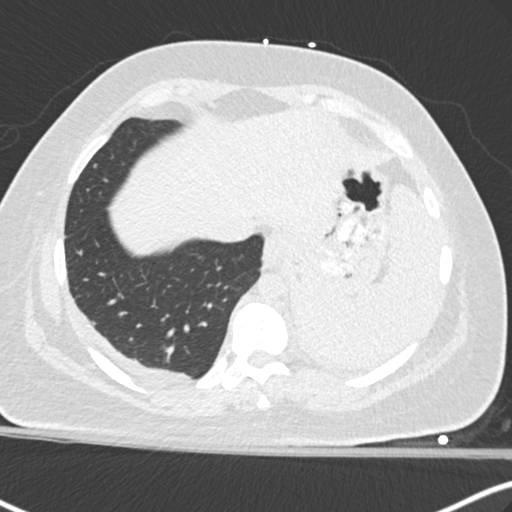
[im 42/133  lung]
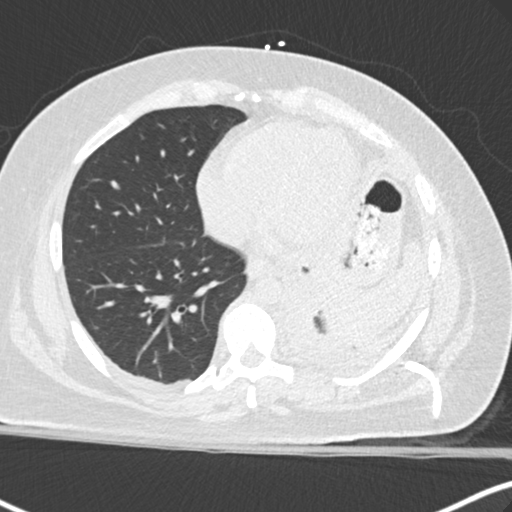
[im 49/133  mediastinal]
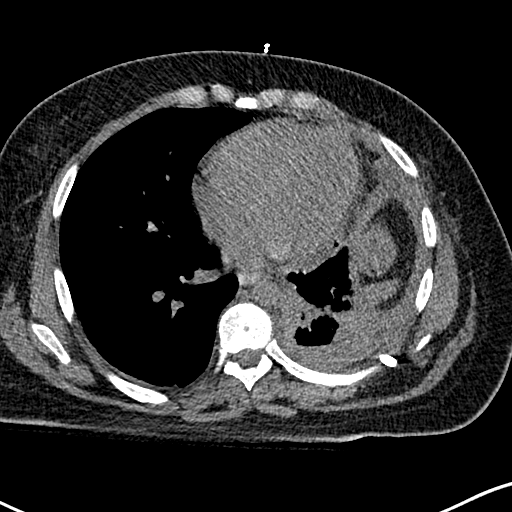
[im 49/133  lung]
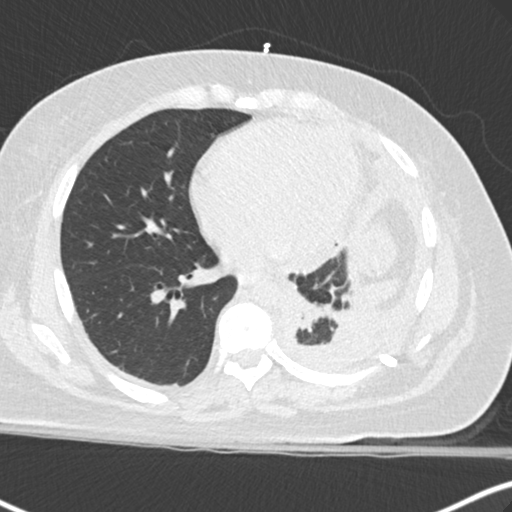
[im 63/133  lung]
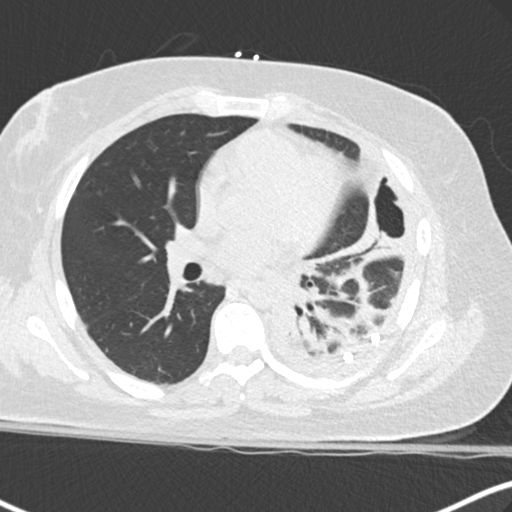
[im 70/133  lung]
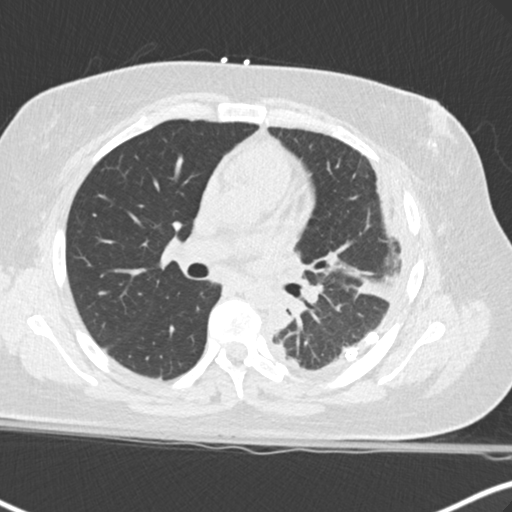
[im 84/133  lung]
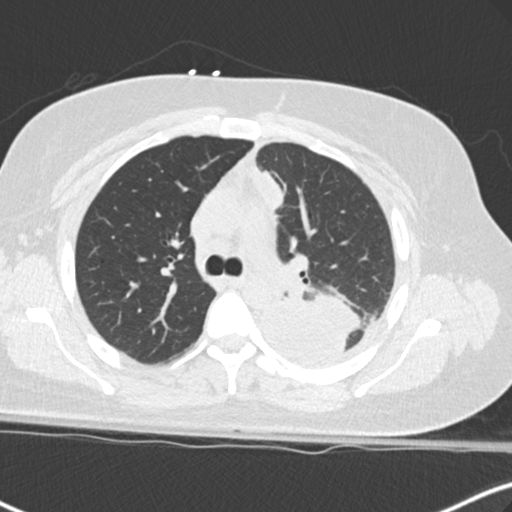
[im 91/133  mediastinal]
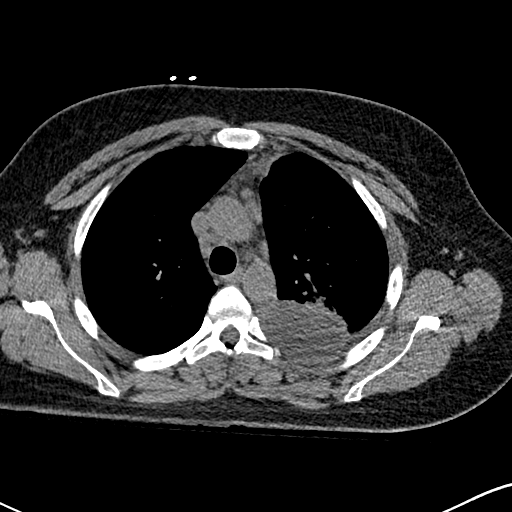
[im 91/133  lung]
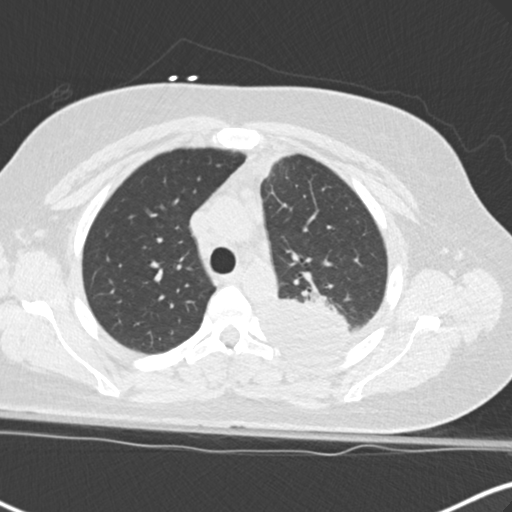
[im 105/133  lung]
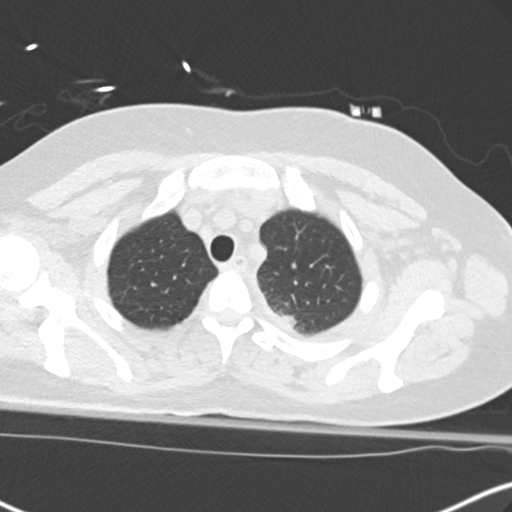
[im 112/133  lung]
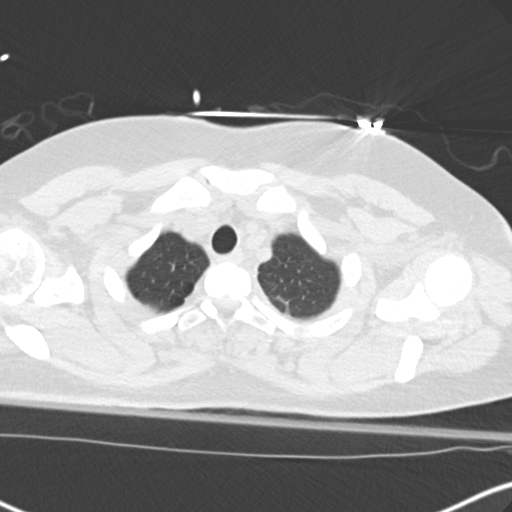
[im 126/133  lung]
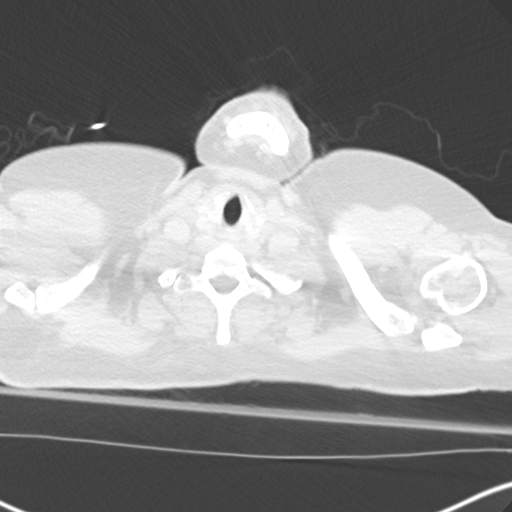

[Series 7: coronal · coronal · 0.63mm/px · 3 of 108 slices shown]
[im 22/108  lung]
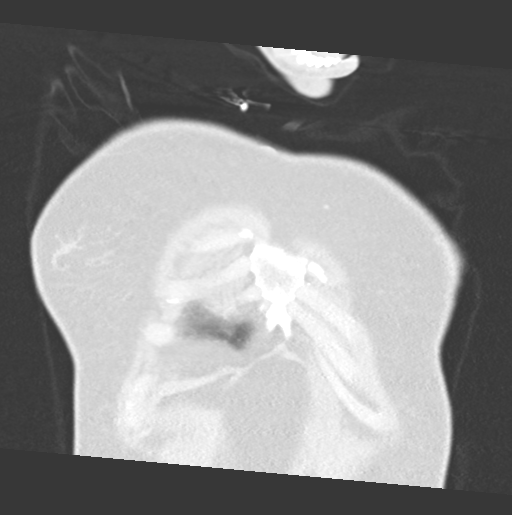
[im 43/108  lung]
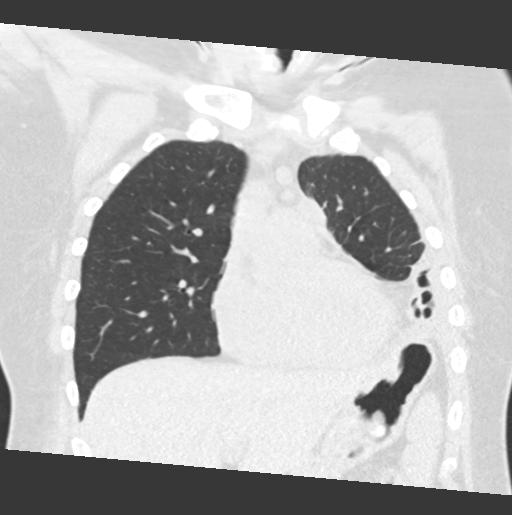
[im 65/108  lung]
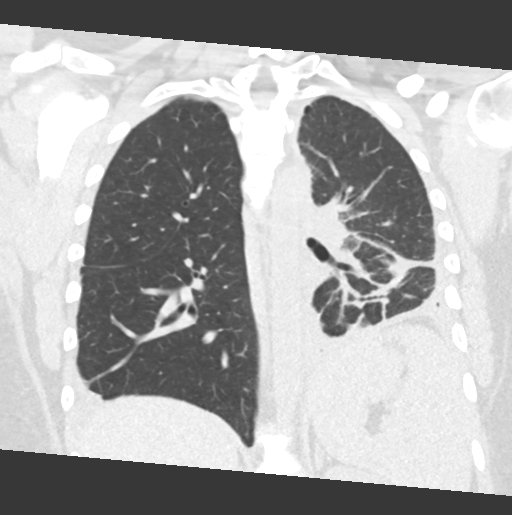

[15 of 36 positions shown; findings below may reference images not displayed]

FINDINGS: Cardiovascular: Heart size is normal. There is no significant
pericardial fluid, thickening or pericardial calcification. No
atherosclerotic calcifications are noted in the thoracic aorta or
the coronary arteries.

Mediastinum/Nodes: No pathologically enlarged mediastinal or hilar
lymph nodes. Several prominent borderline enlarged mediastinal lymph
nodes are again noted, likely reactive. Esophagus is unremarkable in
appearance. No axillary lymphadenopathy.

Lungs/Pleura: New left-sided pigtail drainage catheter with tip
reformed in the posterior aspect of the left hemithorax. Previously
noted large left-sided pleural fluid collection has substantially
decreased in size, although there continues to be a loculated
component in the posterior aspect of the upper left hemithorax just
cephalad to the superior segment of the left lower lobe slightly
above the tip of the catheter. Trace volume of right pleural fluid,
new compared to the prior examination. Patchy areas of atelectasis
in the left lower lobe, overall with improved left lower lobe
aeration compared to the prior examination. More well-defined area
of cavitation in the inferior segment of the lingula than seen on
the prior examination, presumably secondary to necrotizing
pneumonia. Right lung is clear.

Upper Abdomen: Unremarkable.

Musculoskeletal: There are no aggressive appearing lytic or blastic
lesions noted in the visualized portions of the skeleton.
IMPRESSION: 1. Interval placement of pigtail drainage catheter in the left
hemithorax with effective drainage of much of the previously noted
left-sided empyema, although there continues to be a small loculated
component, as above.
2. Interval development of a small right pleural effusion.
3. Further cavitation of necrotizing pneumonia in the inferior
segment of the lingula.

## 2021-06-07 MED ORDER — KETOROLAC TROMETHAMINE 30 MG/ML IJ SOLN
30.0000 mg | Freq: Four times a day (QID) | INTRAMUSCULAR | Status: AC | PRN
  Administered 2021-06-07 – 2021-06-08 (×4): 30 mg via INTRAVENOUS
  Filled 2021-06-07 (×4): qty 1

## 2021-06-07 NOTE — Progress Notes (Signed)
PROGRESS NOTE  Sharon [name]  ZOX:[id number] DOB: 1991-02-26 DOA: 06/02/2021 PCP: Patient, No Pcp Per (Inactive)   Brief Narrative: Sharon [name] is a 29 y.o. female with a history of IVDU, tobacco use, anxiety who presented to the ED on 12/8 with infected wound of the left leg, painful swelling of the right ankle, and fever with cough. She was tachycardic with borderline hypotension, given IVF. WBC 20k. CTA chest revealed cavitary pneumonia with associated loculated left pleural effusion. PCCM was consulted, placed pigtail catheter and is proceeding with fibrinolytic. Right ankle was tapped with no fluid return in ED. Swelling has improved with IV antibiotics alone, so orthopedics not currently recommending surgical management. Blood culture and pleural fluid culture are pending, echocardiogram ordered. ID guiding antibiotics, changed now to vancomycin and cefepime. Additional abscess on MR of right leg which has ruptured prior to I&D. Right ankle arthrocentesis performed 12/12. Cardiothoracic surgery is consulted for empyema, consideration of VATS.  Assessment & Plan: Principal Problem:   Sepsis (HCC) Active Problems:   IV drug abuse (HCC)   Substance abuse (HCC)   Empyema lung (HCC)   Chest tube in place  Sepsis due to multiple infections, concern for bacteremia in IVDU:  - Continue vancomycin, cefepime  - Monitor blood cultures (NGTD x5 days on admission, and 2 sets NGTD 4 days) - Echocardiogram (TTE) without vegetations.  Cavitary lingula pneumonia and empyema:  - Appreciate PCCM management s/p chest tube 12/8, instilling lytics prn (12/9, 12/11). CT surgery consulted, CT chest ordered. - F/u pleural fluid culture (NG2D)  Right LE abscesses and cellulitis, possible right ankle septic arthritis: LE venous U/S negative for DVT - RLE abscess spontaneously draining as of 12/12 prior to operative I&D. Continue wound care.  - Right ankle synovial culture NGTD, negative gram  stain.  Right axillary stranding: Unclear significance. No complaints from patient, though there is some AC erythema and pt injects in UE's.   - MR right humerus shows resolution of soft tissue stranding and no alternative sites of infection.   Left calf abscess: Spontaneously draining.  - Continue local wound care and abx as above.  IVDU, narcotic use: HIV NR, hep B Ab+, Ag - (immune) - Monitor for withdrawal.  - Clonidine prn - Cessation counseling provided  Tobacco use:  - Cessation counseling - Nicotine patch  Microcytic anemia: Exacerbated by IVF causing hemodilution. No active bleeding noted.  - Hgb improved with transfusion 1u PRBC 12/9. Continue monitoring.   Hypoalbuminemia: Suspected malnutrition.  - Dietitian consulted.   Hypokalemia: Resolved  Anxiety:  - Xanax prn, increase dose to 0.5mg . Continue clonidine.  Obesity: Estimated body mass index is 33.47 kg/m as calculated from the following:   Height as of this encounter:  (1.6 m).   Weight as of this encounter: 85.7 kg.  DVT prophylaxis: Lovenox Code Status: Full Family Communication: Boyfriend at bedside this AM Disposition Plan:  Status is: Inpatient  Remains inpatient appropriate because: continued work up of multiple infections, requiring IV abx.  Consultants:  PCCM Orthopedics Infectious diseases  Procedures:  Left pigtail catheter/chest tube placement 06/02/2021 Right ankle arthrocentesis 06/06/2021  Antimicrobials: Vancomycin cefepime >>  Flagyl 12/8 - 12/9   Subjective: Breathing better today, pain in right leg is severe, waxing/waning, worse with pressure/movement. No fevers or chills. Anxiety is controlled. Can't bear weight on right foot.  Objective: Vitals:   06/06/21 1959 06/06/21 2300 06/07/21 0421 06/07/21 0755  BP: (!) 105/56 114/62 117/65 115/65  Pulse: 92 90 82  92  Resp: Temp: 97.8 F (36.6 C) 98.7 F (37.1 C) 98.4 F (36.9 C) 98.3 F (36.8 C)   TempSrc: Oral Oral Oral Oral  SpO2: 97% 98% 99% 100%  Weight:      Height:        Intake/Output Summary (Last 24 hours) at 06/07/2021 1456 Last data filed at 06/07/2021 1200 Gross per 24 hour  Intake 880.01 ml  Output 530 ml  Net 350.01 ml   Filed Weights   06/02/21 0036 06/02/21 0456 06/02/21 1331  Weight: 89.8 kg 86.4 kg 85.7 kg   Gen: 30 y.o. female in no distress Pulm: Nonlabored breathing room air. Diminished on left. CV: Regular rate and rhythm. No murmur, rub, or gallop. No JVD, no pitting dependent edema. GI: Abdomen soft, non-tender, non-distended, with normoactive bowel sounds.  Ext: Warm, dry, improving edema RLE. Skin: No new rashes, lesions or ulcers on visualized skin.Dressings on BLE's c/d/I without exudate.  Neuro: Alert and oriented. No focal neurological deficits. Psych: Judgement and insight appear fair. Mood euthymic & affect congruent. Behavior is appropriate.    Data Reviewed: I have personally reviewed following labs and imaging studies  CBC: Recent Labs  Lab 06/02/21 0221 06/03/21 0152 06/03/21 1229 06/03/21 2340 06/05/21 0428 06/05/21 2313  WBC 20.5* 12.5*  --  8.5 6.9 7.0  NEUTROABS 15.4*  --   --  5.4 4.7  --   HGB 8.7* 7.1* 6.4* 8.1* 8.5* 8.2*  HCT 28.8* 23.5* 22.0* 26.1* 28.0* 27.4*  MCV 74.2* 75.1*  --  75.4* 77.1* 79.0*  PLT 489* 407*  --  420* 492* 413*   Basic Metabolic Panel: Recent Labs  Lab 06/02/21 0221 06/03/21 0152 06/03/21 2340 06/05/21 0428 06/05/21 2313  NA 131* 133* 135 139 134*  K 3.2* 4.2 4.4 3.7 3.9  CL 95* 101 105 107 106  CO2 GLUCOSE 108* 102* 102* 120* 135*  BUN CREATININE 0.69 0.56 0.49 0.61 0.47  CALCIUM 7.8* 7.4* 7.6* 8.0* 7.6*  MG  --  1.7  --  2.0 1.8  PHOS  --   --   --  3.1 3.8   GFR: Estimated Creatinine Clearance: 106.6 mL/min (by C-G formula based on SCr of 0.47 mg/dL). Liver Function Tests: Recent Labs  Lab 06/02/21 0221 06/03/21 2340  AST 29 27  ALT 21  QUANTITY NOT SUFFICIENT, UNABLE TO PERFORM TEST  ALKPHOS 106 69  BILITOT 1.8* QUANTITY NOT SUFFICIENT, UNABLE TO PERFORM TEST  PROT 8.4* 6.2*  ALBUMIN 2.0* <1.5*   No results for input(s): LIPASE, AMYLASE in the last 168 hours. No results for input(s): AMMONIA in the last 168 hours. Coagulation Profile: Recent Labs  Lab 06/02/21 0450  INR 1.2   Cardiac Enzymes: No results for input(s): CKTOTAL, CKMB, CKMBINDEX, TROPONINI in the last 168 hours. BNP (last 3 results) No results for input(s): PROBNP in the last 8760 hours. HbA1C: No results for input(s): HGBA1C in the last 72 hours. CBG: No results for input(s): GLUCAP in the last 168 hours. Lipid Profile: No results for input(s): CHOL, HDL, LDLCALC, TRIG, CHOLHDL, LDLDIRECT in the last 72 hours. Thyroid Function Tests: No results for input(s): TSH, T4TOTAL, FREET4, T3FREE, THYROIDAB in the last 72 hours. Anemia Panel: No results for input(s): VITAMINB12, FOLATE, FERRITIN, TIBC, IRON, RETICCTPCT in the last 72 hours.  Urine analysis:    Component Value Date/Time   COLORURINE AMBER (A) 06/02/2021 0745  APPEARANCEUR HAZY (A) 06/02/2021 0745   LABSPEC 1.020 06/02/2021 0745   PHURINE 5.5 06/02/2021 0745   GLUCOSEU NEGATIVE 06/02/2021 0745   HGBUR TRACE (A) 06/02/2021 0745   BILIRUBINUR MODERATE (A) 06/02/2021 0745   KETONESUR NEGATIVE 06/02/2021 0745   PROTEINUR 30 (A) 06/02/2021 0745   NITRITE POSITIVE (A) 06/02/2021 0745   LEUKOCYTESUR NEGATIVE 06/02/2021 0745   Recent Results (from the past 240 hour(s))  Resp Panel by RT-PCR (Flu A&B, Covid) Nasopharyngeal Swab     Status: None   Collection Time: 06/02/21  4:50 AM   Specimen: Nasopharyngeal Swab; Nasopharyngeal(NP) swabs in vial transport medium  Result Value Ref Range Status   SARS Coronavirus 2 by RT PCR NEGATIVE NEGATIVE Final    Comment: (NOTE) SARS-CoV-2 target nucleic acids are NOT DETECTED.  The SARS-CoV-2 RNA is generally detectable in upper  respiratory specimens during the acute phase of infection. The lowest concentration of SARS-CoV-2 viral copies this assay can detect is 138 copies/mL. A negative result does not preclude SARS-Cov-2 infection and should not be used as the sole basis for treatment or other patient management decisions. A negative result may occur with  improper specimen collection/handling, submission of specimen other than nasopharyngeal swab, presence of viral mutation(s) within the areas targeted by this assay, and inadequate number of viral copies(<138 copies/mL). A negative result must be combined with clinical observations, patient history, and epidemiological information. The expected result is Negative.  Fact Sheet for Patients:  BloggerCourse.com  Fact Sheet for Healthcare Providers:  SeriousBroker.it  This test is no t yet approved or cleared by the [place] FDA and  has been authorized for detection and/or diagnosis of SARS-CoV-2 by FDA under an Emergency Use Authorization (EUA). This EUA will remain  in effect (meaning this test can be used) for the duration of the COVID-19 declaration under Section 564(b)(1) of the Act, 21 U.S.C.section 360bbb-3(b)(1), unless the authorization is terminated  or revoked sooner.       Influenza A by PCR NEGATIVE NEGATIVE Final   Influenza B by PCR NEGATIVE NEGATIVE Final    Comment: (NOTE) The Xpert Xpress SARS-CoV-2/FLU/RSV plus assay is intended as an aid in the diagnosis of influenza from Nasopharyngeal swab specimens and should not be used as a sole basis for treatment. Nasal washings and aspirates are unacceptable for Xpert Xpress SARS-CoV-2/FLU/RSV testing.  Fact Sheet for Patients: BloggerCourse.com  Fact Sheet for Healthcare Providers: SeriousBroker.it  This test is not yet approved or cleared by the [place] FDA and has been  authorized for detection and/or diagnosis of SARS-CoV-2 by FDA under an Emergency Use Authorization (EUA). This EUA will remain in effect (meaning this test can be used) for the duration of the COVID-19 declaration under Section 564(b)(1) of the Act, 21 U.S.C. section 360bbb-3(b)(1), unless the authorization is terminated or revoked.  Performed at [facility], [address] [place], [place] [address]   Blood Culture (routine x 2)     Status: None   Collection Time: 06/02/21  4:50 AM   Specimen: Left Antecubital; Blood  Result Value Ref Range Status   Specimen Description   Final    LEFT ANTECUBITAL Performed at [facility], [address] [place], [place] [address]    Special Requests   Final    BOTTLES DRAWN AEROBIC AND ANAEROBIC Blood Culture adequate volume Performed at [facility], [address] [place], [place] [address]    Culture   Final  NO GROWTH 5 DAYS Performed at [facility], [address] [place], [place] [address]    Report Status 06/07/2021 FINAL  Final  Urine Culture     Status: Abnormal   Collection Time: 06/02/21  7:45 AM   Specimen: In/Out Cath Urine  Result Value Ref Range Status   Specimen Description   Final    IN/OUT CATH URINE Performed at [facility], [address] [place], [place] [address]    Special Requests   Final    NONE Performed at [facility], [address] [place], [place] [address]    Culture MULTIPLE SPECIES PRESENT, SUGGEST RECOLLECTION (A)  Final   Report Status 06/03/2021 FINAL  Final  Body fluid culture w Gram Stain     Status: None (Preliminary result)   Collection Time: 06/02/21  3:01 PM   Specimen: Pleural Fluid  Result Value Ref Range Status   Specimen Description PLEURAL FLUID LEFT  Final   Special Requests NONE  Final   Gram Stain   Final    FEW WBC PRESENT, PREDOMINANTLY PMN NO ORGANISMS SEEN    Culture   Final    NO GROWTH 2  DAYS Performed at [facility], [address], [place], [place] [address]    Report Status PENDING  Incomplete  Acid Fast Smear (AFB)     Status: None   Collection Time: 06/02/21  3:01 PM   Specimen: Pleural Fluid  Result Value Ref Range Status   AFB Specimen Processing Concentration  Final   Acid Fast Smear Negative  Final    Comment: (NOTE) Performed At: [facility] [address] [place], [place] [id number] Jolene Schimke MD [id number]    Source (AFB) PLEURAL  Final    Comment: FLUID LEFT Performed at [facility], [address], [place], [place] [address]   Culture, blood (Routine X 2) w Reflex to ID Panel     Status: None (Preliminary result)   Collection Time: 06/03/21 12:21 PM   Specimen: BLOOD LEFT HAND  Result Value Ref Range Status   Specimen Description BLOOD LEFT HAND  Final   Special Requests   Final    BOTTLES DRAWN AEROBIC AND ANAEROBIC Blood Culture results may not be optimal due to an inadequate volume of blood received in culture bottles   Culture   Final    NO GROWTH 4 DAYS Performed at [facility], [address] [place], [place] [address]    Report Status PENDING  Incomplete  Culture, blood (Routine X 2) w Reflex to ID Panel     Status: None (Preliminary result)   Collection Time: 06/03/21 12:31 PM   Specimen: BLOOD LEFT HAND  Result Value Ref Range Status   Specimen Description BLOOD LEFT HAND  Final   Special Requests   Final    BOTTLES DRAWN AEROBIC ONLY Blood Culture results may not be optimal due to an inadequate volume of blood received in culture bottles   Culture   Final    NO GROWTH 4 DAYS Performed at [facility], [address], [place], [place] [address]    Report Status PENDING  Incomplete  Surgical PCR screen     Status: None   Collection Time: 06/05/21  9:49 PM   Specimen: Nasal Mucosa; Nasal Swab  Result Value Ref Range Status   MRSA, PCR NEGATIVE NEGATIVE Final   Staphylococcus aureus  NEGATIVE NEGATIVE Final  Comment: (NOTE) The Xpert SA Assay (FDA approved for NASAL specimens in patients 35 years of age and older), is one component of a comprehensive surveillance program. It is not intended to diagnose infection nor to guide or monitor treatment. Performed at [facility], [address] [place], [place] [address]   Body fluid culture w Gram Stain     Status: None (Preliminary result)   Collection Time: 06/06/21 11:38 AM   Specimen: Synovium; Synovial Fluid  Result Value Ref Range Status   Specimen Description SYNOVIAL FLUID  Final   Special Requests RIGHT ANKLE  Final   Gram Stain   Final    RARE WBC PRESENT, PREDOMINANTLY MONONUCLEAR NO ORGANISMS SEEN    Culture   Final    NO GROWTH < 24 HOURS Performed at [facility], [address] [place], [place] [address]    Report Status PENDING  Incomplete      Radiology Studies: CT CHEST WO CONTRAST  Result Date: 06/07/2021 CLINICAL DATA:  30 year old female with history of empyema. EXAM: CT CHEST WITHOUT CONTRAST TECHNIQUE: Multidetector CT imaging of the chest was performed following the standard protocol without IV contrast. COMPARISON:  Chest CT 06/02/2021. FINDINGS: Cardiovascular: Heart size is normal. There is no significant pericardial fluid, thickening or pericardial calcification. No atherosclerotic calcifications are noted in the thoracic aorta or the coronary arteries. Mediastinum/Nodes: No pathologically enlarged mediastinal or hilar lymph nodes. Several prominent borderline enlarged mediastinal lymph nodes are again noted, likely reactive. Esophagus is unremarkable in appearance. No axillary lymphadenopathy. Lungs/Pleura: New left-sided pigtail drainage catheter with tip reformed in the posterior aspect of the left hemithorax. Previously noted large left-sided pleural fluid collection has substantially decreased in size, although there continues to be a loculated component in the posterior  aspect of the upper left hemithorax just cephalad to the superior segment of the left lower lobe slightly above the tip of the catheter. Trace volume of right pleural fluid, new compared to the prior examination. Patchy areas of atelectasis in the left lower lobe, overall with improved left lower lobe aeration compared to the prior examination. More well-defined area of cavitation in the inferior segment of the lingula than seen on the prior examination, presumably secondary to necrotizing pneumonia. Right lung is clear. Upper Abdomen: Unremarkable. Musculoskeletal: There are no aggressive appearing lytic or blastic lesions noted in the visualized portions of the skeleton. IMPRESSION: 1. Interval placement of pigtail drainage catheter in the left hemithorax with effective drainage of much of the previously noted left-sided empyema, although there continues to be a small loculated component, as above. 2. Interval development of a small right pleural effusion. 3. Further cavitation of necrotizing pneumonia in the inferior segment of the lingula. Electronically Signed   By: Trudie Reed M.D.   On: 06/07/2021 11:06   MR HUMERUS RIGHT W WO CONTRAST  Result Date: 06/07/2021 CLINICAL DATA:  Right axillary soft tissue stranding and fluid seen on chest CT. Patient denies right upper arm complaints. History of IV drug abuse with empyema and necrotizing pneumonia. EXAM: MRI OF THE RIGHT HUMERUS WITHOUT AND WITH CONTRAST TECHNIQUE: Multiplanar, multisequence MR imaging of the right upper arm was performed before and after the administration of intravenous contrast. CONTRAST:  8.66mL GADAVIST GADOBUTROL 1 MMOL/ML IV SOLN COMPARISON:  CTA chest dated June 02, 2021. FINDINGS: Bones/Joint/Cartilage No marrow signal abnormality. No fracture or dislocation. Joint spaces are preserved. No joint effusion. Muscles and Tendons Intact.  No muscle edema or atrophy. Soft tissue  No fluid collection or hematoma.  No soft tissue  mass. Small right pleural effusion. IMPRESSION: 1. No evidence of soft tissue infection or septic arthritis. Stranding/fluid in the right axilla seen on recent CT has resolved. 2. Small right pleural effusion. Electronically Signed   By: Obie Dredge M.D.   On: 06/07/2021 10:50   DG Chest Port 1 View  Result Date: 06/06/2021 CLINICAL DATA:  30 year old female status post left thoracostomy tube placement. EXAM: PORTABLE CHEST - 1 VIEW COMPARISON:  06/05/2021, 06/04/2021, 06/02/2021 FINDINGS: Persistent obscuration of the left inferior mediastinal silhouette. Unchanged heart size. Unchanged position of indwelling left basilar pigtail thoracostomy tube. Similar to slightly decreased prominence of rounded opacity centered about the aortopulmonic window. The right lung is clear. No evidence of pneumothorax. No cardiomegaly. No acute osseous abnormality. IMPRESSION: Similar appearing small left pleural effusion and left basilar atelectasis with indwelling left pigtail thoracostomy tube. Similar appearance of loculated/fissural upper lobe pleural effusion. Electronically Signed   By: Marliss Coots M.D.   On: 06/06/2021 08:00    Scheduled Meds:  acidophilus  2 capsule Oral Daily   enoxaparin (LOVENOX) injection  40 mg Subcutaneous Q24H   guaiFENesin  1,200 mg Oral BID   nicotine  21 mg Transdermal Daily   sodium chloride flush  10 mL Other Q8H   sodium chloride flush  10 mL Other Q8H   Continuous Infusions:  ceFEPime (MAXIPIME) IV 2 g (06/07/21 1412)   vancomycin 1,000 mg (06/07/21 0718)     LOS: 5 days   Time spent: 35 minutes.  Tyrone Nine, MD [facility] [email or web address] 06/07/2021, 2:56 PM

## 2021-06-08 DIAGNOSIS — J869 Pyothorax without fistula: Secondary | ICD-10-CM | POA: Diagnosis not present

## 2021-06-08 LAB — BODY FLUID CULTURE W GRAM STAIN: Culture: NO GROWTH

## 2021-06-08 LAB — BASIC METABOLIC PANEL
Anion gap: 3 — ABNORMAL LOW (ref 5–15)
BUN: 12 mg/dL (ref 6–20)
CO2: 25 mmol/L (ref 22–32)
Calcium: 8.2 mg/dL — ABNORMAL LOW (ref 8.9–10.3)
Chloride: 104 mmol/L (ref 98–111)
Creatinine, Ser: 0.52 mg/dL (ref 0.44–1.00)
GFR, Estimated: >60 mL/min (ref >60)
Glucose, Bld: 97 mg/dL (ref 70–99)
Potassium: 4.8 mmol/L (ref 3.5–5.1)
Sodium: 132 mmol/L — ABNORMAL LOW (ref 135–145)

## 2021-06-08 LAB — CBC
HCT: 29.5 % — ABNORMAL LOW (ref 36.0–46.0)
Hemoglobin: 8.9 g/dL — ABNORMAL LOW (ref 12.0–15.0)
MCH: 23.8 pg — ABNORMAL LOW (ref 26.0–34.0)
MCHC: 30.2 g/dL (ref 30.0–36.0)
MCV: 78.9 fL — ABNORMAL LOW (ref 80.0–100.0)
Platelets: 380 10*3/uL (ref 150–400)
RBC: 3.74 MIL/uL — ABNORMAL LOW (ref 3.87–5.11)
RDW: 22.2 % — ABNORMAL HIGH (ref 11.5–15.5)
WBC: 6.3 10*3/uL (ref 4.0–10.5)
nRBC: 0 % (ref 0.0–0.2)

## 2021-06-08 LAB — CULTURE, BLOOD (ROUTINE X 2)
Culture: NO GROWTH
Culture: NO GROWTH

## 2021-06-08 MED ORDER — SODIUM CHLORIDE 0.9 % IV SOLN
2.0000 g | INTRAVENOUS | Status: DC
  Administered 2021-06-08 – 2021-06-09 (×2): 2 g via INTRAVENOUS
  Filled 2021-06-08 (×4): qty 20

## 2021-06-08 NOTE — Progress Notes (Signed)
[facility]  Date of Admission:  06/02/2021           Reason for visit: Follow up on empyema, leg infection  Current antibiotics: Vancomycin 12/8-present Cefepime 12/8-present   ASSESSMENT:    30 y.o. female admitted with:  Right lower leg infection: with subcutaneous soft tissue abscess spontaneously draining.  Right ankle aspiration cultures NGTD and she reports improved ROM. Left leg abscess: this has also been spontaneously draining. Left lung empyema and cavitary pneumonia: Status post chest tube placement 06/02/21.  Repeat CT scan shows improvement with small loculated component remaining.  Right axillary stranding: Resolved on MRI. IVDU  RECOMMENDATIONS:    Continue vancomycin per pharmacy Will change cefepime to ceftriaxone Await CT surgery, CCM follow up on recent CT chest for further recommendations Wound care Will follow.  Anticipate conversion to doxycycline and Augmentin at discharge.   Principal Problem:   Sepsis ([place]) Active Problems:   IV drug abuse ([place])   Substance abuse ([place])   Empyema lung ([place])   Chest tube in place    MEDICATIONS:    Scheduled Meds:  acidophilus  2 capsule Oral Daily   enoxaparin (LOVENOX) injection  40 mg Subcutaneous Q24H   guaiFENesin  1,200 mg Oral BID   nicotine  21 mg Transdermal Daily   sodium chloride flush  10 mL Other Q8H   sodium chloride flush  10 mL Other Q8H   Continuous Infusions:  ceFEPime (MAXIPIME) IV 2 g (06/08/21 0553)   vancomycin 1,000 mg (06/08/21 0651)   PRN Meds:.acetaminophen **OR** acetaminophen, ALPRAZolam, cloNIDine, hydrOXYzine, ipratropium-albuterol, ketorolac  SUBJECTIVE:   24 hour events:  No acute events noted overnight Afebrile, T-max 98.1 Blood cultures remain negative WBC 6.3 Cultures from her right ankle are negative Cultures from her pleural fluid are negative She continues on vancomycin and cefepime She was evaluated by CT surgery who felt  that her symptoms have improved with chest tube placement and are not recommending VATS at this time.  They recommend repeat CT chest. Repeat CT chest showed effective drainage of much of the previously seen left-sided empyema with a small loculated component remaining.  She also had further cavitation of a necrotizing pneumonia in the inferior segment of the lingula.  She is on room air.  No respiratory complaints.  No fevers.  Leg abscesses are draining.  Right ankle ROM improved.  Review of Systems  All other systems reviewed and are negative.    OBJECTIVE:   Blood pressure (!) 101/53, pulse 72, temperature (!) 97.5 F (36.4 C), temperature source Oral, resp. rate 16, height 5\' 3"  (1.6 m), weight 85.7 kg, last menstrual period 05/13/2021, SpO2 97 %. Body mass index is 33.47 kg/m.  Physical Exam Constitutional:      General: She is not in acute distress.    Appearance: Normal appearance.  HENT:     Head: Normocephalic and atraumatic.  Eyes:     Extraocular Movements: Extraocular movements intact.     Conjunctiva/sclera: Conjunctivae normal.  Pulmonary:     Effort: Pulmonary effort is normal. No respiratory distress.     Comments: Chest tube in place.  Abdominal:     General: There is no distension.     Palpations: Abdomen is soft.  Musculoskeletal:     Comments: Improved ROM with right ankle. Right leg abscess wrapped.   Left leg abscess wrapped.   Skin:    General: Skin is warm and dry.  Neurological:  General: No focal deficit present.     Mental Status: She is alert and oriented to person, place, and time.  Psychiatric:        Mood and Affect: Mood normal.        Behavior: Behavior normal.     Lab Results: Lab Results  Component Value Date   WBC 6.3 06/08/2021   HGB 8.9 (L) 06/08/2021   HCT 29.5 (L) 06/08/2021   MCV 78.9 (L) 06/08/2021   PLT 380 06/08/2021    Lab Results  Component Value Date   NA 132 (L) 06/08/2021   K 4.8 06/08/2021   CO2 25  06/08/2021   GLUCOSE 97 06/08/2021   BUN 12 06/08/2021   CREATININE 0.52 06/08/2021   CALCIUM 8.2 (L) 06/08/2021   GFRNONAA >60 06/08/2021   GFRAA >60 04/03/2019    Lab Results  Component Value Date   ALT QUANTITY NOT SUFFICIENT, UNABLE TO PERFORM TEST 06/03/2021   AST 27 06/03/2021   ALKPHOS 69 06/03/2021   BILITOT QUANTITY NOT SUFFICIENT, UNABLE TO PERFORM TEST 06/03/2021    No results found for: CRP  No results found for: ESRSEDRATE   I have reviewed the micro and lab results in Epic.  Imaging: CT CHEST WO CONTRAST  Result Date: 06/07/2021 CLINICAL DATA:  29 year old female with history of empyema. EXAM: CT CHEST WITHOUT CONTRAST TECHNIQUE: Multidetector CT imaging of the chest was performed following the standard protocol without IV contrast. COMPARISON:  Chest CT 06/02/2021. FINDINGS: Cardiovascular: Heart size is normal. There is no significant pericardial fluid, thickening or pericardial calcification. No atherosclerotic calcifications are noted in the thoracic aorta or the coronary arteries. Mediastinum/Nodes: No pathologically enlarged mediastinal or hilar lymph nodes. Several prominent borderline enlarged mediastinal lymph nodes are again noted, likely reactive. Esophagus is unremarkable in appearance. No axillary lymphadenopathy. Lungs/Pleura: New left-sided pigtail drainage catheter with tip reformed in the posterior aspect of the left hemithorax. Previously noted large left-sided pleural fluid collection has substantially decreased in size, although there continues to be a loculated component in the posterior aspect of the upper left hemithorax just cephalad to the superior segment of the left lower lobe slightly above the tip of the catheter. Trace volume of right pleural fluid, new compared to the prior examination. Patchy areas of atelectasis in the left lower lobe, overall with improved left lower lobe aeration compared to the prior examination. More well-defined area of  cavitation in the inferior segment of the lingula than seen on the prior examination, presumably secondary to necrotizing pneumonia. Right lung is clear. Upper Abdomen: Unremarkable. Musculoskeletal: There are no aggressive appearing lytic or blastic lesions noted in the visualized portions of the skeleton. IMPRESSION: 1. Interval placement of pigtail drainage catheter in the left hemithorax with effective drainage of much of the previously noted left-sided empyema, although there continues to be a small loculated component, as above. 2. Interval development of a small right pleural effusion. 3. Further cavitation of necrotizing pneumonia in the inferior segment of the lingula. Electronically Signed   By: Vinnie Langton M.D.   On: 06/07/2021 11:06   MR HUMERUS RIGHT W WO CONTRAST  Result Date: 06/07/2021 CLINICAL DATA:  Right axillary soft tissue stranding and fluid seen on chest CT. Patient denies right upper arm complaints. History of IV drug abuse with empyema and necrotizing pneumonia. EXAM: MRI OF THE RIGHT HUMERUS WITHOUT AND WITH CONTRAST TECHNIQUE: Multiplanar, multisequence MR imaging of the right upper arm was performed before and after the administration of intravenous contrast.  CONTRAST:  8.15mL GADAVIST GADOBUTROL 1 MMOL/ML IV SOLN COMPARISON:  CTA chest dated June 02, 2021. FINDINGS: Bones/Joint/Cartilage No marrow signal abnormality. No fracture or dislocation. Joint spaces are preserved. No joint effusion. Muscles and Tendons Intact.  No muscle edema or atrophy. Soft tissue No fluid collection or hematoma.  No soft tissue mass. Small right pleural effusion. IMPRESSION: 1. No evidence of soft tissue infection or septic arthritis. Stranding/fluid in the right axilla seen on recent CT has resolved. 2. Small right pleural effusion. Electronically Signed   By: Obie Dredge M.D.   On: 06/07/2021 10:50     Imaging independently reviewed in Epic.    Vedia Coffer for  Infectious Disease [facility], [facility] [phone] pager 06/08/2021, 8:27 AM

## 2021-06-08 NOTE — Progress Notes (Signed)
PROGRESS NOTE    Sharon [name]  ZOX:[id number] DOB: [birth year] DOA: 06/02/2021 PCP: Patient, No Pcp Per (Inactive)   Chief Complaint  Patient presents with   Abscess    Brief Narrative:   Sharon [name] is a 30 y.o. female with a history of IVDU, tobacco use, anxiety who presented to the ED on 12/8 with infected wound of the left leg, painful swelling of the right ankle, and fever with cough. She was tachycardic with borderline hypotension, given IVF. WBC 20k. CTA chest revealed cavitary pneumonia with associated loculated left pleural effusion. PCCM was consulted, placed pigtail catheter and is proceeding with fibrinolytic. Right ankle was tapped with no fluid return in ED. Swelling has improved with IV antibiotics alone, so orthopedics not currently recommending surgical management. Blood culture and pleural fluid culture are pending, echocardiogram ordered. ID guiding antibiotics, changed now to vancomycin and cefepime. Additional abscess on MR of right leg which has ruptured prior to I&D. Right ankle arthrocentesis performed 12/12. Cardiothoracic surgery is consulted for empyema, consideration of VATS.   Assessment & Plan:   Principal Problem:   Sepsis (HCC) Active Problems:   IV drug abuse (HCC)   Substance abuse (HCC)   Empyema lung (HCC)   Chest tube in place   Sepsis due to multiple infections , concern for bacteremia in IVDU:  - Resume vancomycin and cefepime.  Echocardiogram - vegetations.    Cavitary lingula pneumonia and empyema:  PCCM on board.  CT surgery on board and s/p chest tube placement on 06/02/2021.  Follow up pleural fluid cultures.  ID on board. Recommends transition to rocephin and  Vancomycin.    Right Lower extremity abscesses and cellulitis:  Right ankle synovial culture NGTD.  Lower extremity duplex negative for DVT.    Left calf Abscess:  - spontaneous draining.   IVDU, narcotic use,  No signs of withdrawal.    Hypokalemia:   Replaced.   Microcytic anemia:  S/p U unit of transfusion.  Hemoglobin stable post transfusion.    Hypoalbuminemia:  Dietary consulted.    Tobacco abuse:  On nicotine patch.   Obesity:  Body mass index is 33.47 kg/m. Increased risk of morbidity and mortality.       DVT prophylaxis: (Lovenox) Code Status: (Full code) Family Communication: family at bedside.  Disposition:   Status is: Inpatient  Remains inpatient appropriate because: IV antibiotics.       Consultants:  ID  Procedures:  Antimicrobials: Antibiotics Given (last 72 hours)     Date/Time Action Medication Dose Rate   06/05/21 1350 New Bag/Given   ceFEPIme (MAXIPIME) 2 g in sodium chloride 0.9 % 100 mL IVPB 2 g 200 mL/hr   06/05/21 2040 New Bag/Given   ceFEPIme (MAXIPIME) 2 g in sodium chloride 0.9 % 100 mL IVPB 2 g 200 mL/hr   06/05/21 2116 New Bag/Given   vancomycin (VANCOCIN) IVPB 1000 mg/200 mL premix 1,000 mg 200 mL/hr   06/06/21 0521 New Bag/Given   ceFEPIme (MAXIPIME) 2 g in sodium chloride 0.9 % 100 mL IVPB 2 g 200 mL/hr   06/06/21 1433 New Bag/Given   ceFEPIme (MAXIPIME) 2 g in sodium chloride 0.9 % 100 mL IVPB 2 g 200 mL/hr   06/06/21 1752 New Bag/Given   vancomycin (VANCOCIN) IVPB 1000 mg/200 mL premix 1,000 mg 200 mL/hr   06/06/21 2346 New Bag/Given   ceFEPIme (MAXIPIME) 2 g in sodium chloride 0.9 % 100 mL IVPB 2 g 200 mL/hr   06/07/21 0602 New Bag/Given  ceFEPIme (MAXIPIME) 2 g in sodium chloride 0.9 % 100 mL IVPB 2 g 200 mL/hr   06/07/21 0718 New Bag/Given   vancomycin (VANCOCIN) IVPB 1000 mg/200 mL premix 1,000 mg 200 mL/hr   06/07/21 1412 New Bag/Given   ceFEPIme (MAXIPIME) 2 g in sodium chloride 0.9 % 100 mL IVPB 2 g 200 mL/hr   06/07/21 1748 New Bag/Given   vancomycin (VANCOCIN) IVPB 1000 mg/200 mL premix 1,000 mg 200 mL/hr   06/07/21 2150 New Bag/Given   ceFEPIme (MAXIPIME) 2 g in sodium chloride 0.9 % 100 mL IVPB 2 g 200 mL/hr   06/08/21 0553 New Bag/Given   ceFEPIme  (MAXIPIME) 2 g in sodium chloride 0.9 % 100 mL IVPB 2 g 200 mL/hr   06/08/21 0651 New Bag/Given   vancomycin (VANCOCIN) IVPB 1000 mg/200 mL premix 1,000 mg 200 mL/hr        Subjective: Right ankle tenderness improving.   Objective: Vitals:   06/07/21 1945 06/07/21 2335 06/08/21 0406 06/08/21 0946  BP: 105/62 (!) 102/57 (!) 101/53 112/69  Pulse: 80 83 72 81  Resp: Temp: 98.1 F (36.7 C) (!) 97.4 F (36.3 C) (!) 97.5 F (36.4 C) 97.8 F (36.6 C)  TempSrc: Oral Oral Oral Oral  SpO2: 99% 95% 97% 99%  Weight:      Height:        Intake/Output Summary (Last 24 hours) at 06/08/2021 1155 Last data filed at 06/08/2021 1100 Gross per 24 hour  Intake 1766.57 ml  Output 51 ml  Net 1715.57 ml   Filed Weights   06/02/21 0036 06/02/21 0456 06/02/21 1331  Weight: 89.8 kg 86.4 kg 85.7 kg    Examination:  General exam: Appears calm and comfortable  Respiratory system: diminished at bases, chest tube in place.  Cardiovascular system: S1 & S2 heard, RRR. No pedal edema. Gastrointestinal system: Abdomen is nondistended, soft and nontender.  Normal bowel sounds heard. Central nervous system: Alert and oriented. No focal neurological deficits. Extremities: right ankle tenderness. Leg abscess wrapped.  Skin: No rashes, lesions or ulcers Psychiatry: Mood & affect appropriate.     Data Reviewed: I have personally reviewed following labs and imaging studies  CBC: Recent Labs  Lab 06/02/21 0221 06/03/21 0152 06/03/21 1229 06/03/21 2340 06/05/21 0428 06/05/21 2313 06/08/21 0125  WBC 20.5* 12.5*  --  8.5 6.9 7.0 6.3  NEUTROABS 15.4*  --   --  5.4 4.7  --   --   HGB 8.7* 7.1* 6.4* 8.1* 8.5* 8.2* 8.9*  HCT 28.8* 23.5* 22.0* 26.1* 28.0* 27.4* 29.5*  MCV 74.2* 75.1*  --  75.4* 77.1* 79.0* 78.9*  PLT 489* 407*  --  420* 492* 413* 380    Basic Metabolic Panel: Recent Labs  Lab 06/03/21 0152 06/03/21 2340 06/05/21 0428 06/05/21 2313 06/08/21 0125  NA 133* 135  139 134* 132*  K 4.2 4.4 3.7 3.9 4.8  CL 101 105 107 106 104  CO2 GLUCOSE 102* 102* 120* 135* 97  BUN CREATININE 0.56 0.49 0.61 0.47 0.52  CALCIUM 7.4* 7.6* 8.0* 7.6* 8.2*  MG 1.7  --  2.0 1.8  --   PHOS  --   --  3.1 3.8  --     GFR: Estimated Creatinine Clearance: 106.6 mL/min (by C-G formula based on SCr of 0.52 mg/dL).  Liver Function Tests: Recent Labs  Lab 06/02/21 0221 06/03/21 2340  AST 29  27  ALT 21 QUANTITY NOT SUFFICIENT, UNABLE TO PERFORM TEST  ALKPHOS 106 69  BILITOT 1.8* QUANTITY NOT SUFFICIENT, UNABLE TO PERFORM TEST  PROT 8.4* 6.2*  ALBUMIN 2.0* <1.5*    CBG: No results for input(s): GLUCAP in the last 168 hours.   Recent Results (from the past 240 hour(s))  Resp Panel by RT-PCR (Flu A&B, Covid) Nasopharyngeal Swab     Status: None   Collection Time: 06/02/21  4:50 AM   Specimen: Nasopharyngeal Swab; Nasopharyngeal(NP) swabs in vial transport medium  Result Value Ref Range Status   SARS Coronavirus 2 by RT PCR NEGATIVE NEGATIVE Final    Comment: (NOTE) SARS-CoV-2 target nucleic acids are NOT DETECTED.  The SARS-CoV-2 RNA is generally detectable in upper respiratory specimens during the acute phase of infection. The lowest concentration of SARS-CoV-2 viral copies this assay can detect is 138 copies/mL. A negative result does not preclude SARS-Cov-2 infection and should not be used as the sole basis for treatment or other patient management decisions. A negative result may occur with  improper specimen collection/handling, submission of specimen other than nasopharyngeal swab, presence of viral mutation(s) within the areas targeted by this assay, and inadequate number of viral copies(<138 copies/mL). A negative result must be combined with clinical observations, patient history, and epidemiological information. The expected result is Negative.  Fact Sheet for Patients:  BloggerCourse.com  Fact  Sheet for Healthcare Providers:  SeriousBroker.it  This test is no t yet approved or cleared by the [place] FDA and  has been authorized for detection and/or diagnosis of SARS-CoV-2 by FDA under an Emergency Use Authorization (EUA). This EUA will remain  in effect (meaning this test can be used) for the duration of the COVID-19 declaration under Section 564(b)(1) of the Act, 21 U.S.C.section 360bbb-3(b)(1), unless the authorization is terminated  or revoked sooner.       Influenza A by PCR NEGATIVE NEGATIVE Final   Influenza B by PCR NEGATIVE NEGATIVE Final    Comment: (NOTE) The Xpert Xpress SARS-CoV-2/FLU/RSV plus assay is intended as an aid in the diagnosis of influenza from Nasopharyngeal swab specimens and should not be used as a sole basis for treatment. Nasal washings and aspirates are unacceptable for Xpert Xpress SARS-CoV-2/FLU/RSV testing.  Fact Sheet for Patients: BloggerCourse.com  Fact Sheet for Healthcare Providers: SeriousBroker.it  This test is not yet approved or cleared by the [place] FDA and has been authorized for detection and/or diagnosis of SARS-CoV-2 by FDA under an Emergency Use Authorization (EUA). This EUA will remain in effect (meaning this test can be used) for the duration of the COVID-19 declaration under Section 564(b)(1) of the Act, 21 U.S.C. section 360bbb-3(b)(1), unless the authorization is terminated or revoked.  Performed at [facility], [address] [place], [place] [address]   Blood Culture (routine x 2)     Status: None   Collection Time: 06/02/21  4:50 AM   Specimen: Left Antecubital; Blood  Result Value Ref Range Status   Specimen Description   Final    LEFT ANTECUBITAL Performed at [facility], [address] [place], [place] [address]    Special Requests   Final    BOTTLES DRAWN AEROBIC AND ANAEROBIC Blood  Culture adequate volume Performed at [facility], [address] [place], [place] [address]    Culture   Final    NO GROWTH 5 DAYS Performed at [facility], [address],  [place], [place] [address]    Report Status 06/07/2021 FINAL  Final  Urine Culture     Status: Abnormal   Collection Time: 06/02/21  7:45 AM   Specimen: In/Out Cath Urine  Result Value Ref Range Status   Specimen Description   Final    IN/OUT CATH URINE Performed at [facility], [address] [place], [place] [address]    Special Requests   Final    NONE Performed at [facility], [address] [place], [place] [address]    Culture MULTIPLE SPECIES PRESENT, SUGGEST RECOLLECTION (A)  Final   Report Status 06/03/2021 FINAL  Final  Body fluid culture w Gram Stain     Status: None   Collection Time: 06/02/21  3:01 PM   Specimen: Pleural Fluid  Result Value Ref Range Status   Specimen Description PLEURAL FLUID LEFT  Final   Special Requests NONE  Final   Gram Stain   Final    FEW WBC PRESENT, PREDOMINANTLY PMN NO ORGANISMS SEEN    Culture   Final    NO GROWTH Performed at [facility], [address], [place], [place] [address]    Report Status 06/08/2021 FINAL  Final  Acid Fast Smear (AFB)     Status: None   Collection Time: 06/02/21  3:01 PM   Specimen: Pleural Fluid  Result Value Ref Range Status   AFB Specimen Processing Concentration  Final   Acid Fast Smear Negative  Final    Comment: (NOTE) Performed At: [facility] [address] [place], [place] [id number] Jolene Schimke MD OV:[id number]    Source (AFB) PLEURAL  Final    Comment: FLUID LEFT Performed at [facility], [address], [place], [place] [address]   Culture, blood (Routine X 2) w Reflex to ID Panel     Status: None   Collection Time: 06/03/21 12:21 PM   Specimen: BLOOD LEFT HAND  Result Value Ref Range Status   Specimen Description BLOOD LEFT HAND  Final    Special Requests   Final    BOTTLES DRAWN AEROBIC AND ANAEROBIC Blood Culture results may not be optimal due to an inadequate volume of blood received in culture bottles   Culture   Final    NO GROWTH 5 DAYS Performed at [facility], [address], [place], [place] [address]    Report Status 06/08/2021 FINAL  Final  Culture, blood (Routine X 2) w Reflex to ID Panel     Status: None   Collection Time: 06/03/21 12:31 PM   Specimen: BLOOD LEFT HAND  Result Value Ref Range Status   Specimen Description BLOOD LEFT HAND  Final   Special Requests   Final    BOTTLES DRAWN AEROBIC ONLY Blood Culture results may not be optimal due to an inadequate volume of blood received in culture bottles   Culture   Final    NO GROWTH 5 DAYS Performed at [facility], [address], [place], [place] [address]    Report Status 06/08/2021 FINAL  Final  Surgical PCR screen     Status: None   Collection Time: 06/05/21  9:49 PM   Specimen: Nasal Mucosa; Nasal Swab  Result Value Ref Range Status   MRSA, PCR NEGATIVE NEGATIVE Final   Staphylococcus aureus NEGATIVE NEGATIVE Final    Comment: (NOTE) The Xpert SA Assay (FDA approved for NASAL specimens in patients 16 years of age and  older), is one component of a comprehensive surveillance program. It is not intended to diagnose infection nor to guide or monitor treatment. Performed at [facility], [address] [place], [place] [address]   Body fluid culture w Gram Stain     Status: None (Preliminary result)   Collection Time: 06/06/21 11:38 AM   Specimen: Synovium; Synovial Fluid  Result Value Ref Range Status   Specimen Description SYNOVIAL FLUID  Final   Special Requests RIGHT ANKLE  Final   Gram Stain   Final    RARE WBC PRESENT, PREDOMINANTLY MONONUCLEAR NO ORGANISMS SEEN    Culture   Final    NO GROWTH 2 DAYS Performed at [facility], [address] [place], [place] [address]    Report Status PENDING  Incomplete          Radiology Studies: CT CHEST WO CONTRAST  Result Date: 06/07/2021 CLINICAL DATA:  30 year old female with history of empyema. EXAM: CT CHEST WITHOUT CONTRAST TECHNIQUE: Multidetector CT imaging of the chest was performed following the standard protocol without IV contrast. COMPARISON:  Chest CT 06/02/2021. FINDINGS: Cardiovascular: Heart size is normal. There is no significant pericardial fluid, thickening or pericardial calcification. No atherosclerotic calcifications are noted in the thoracic aorta or the coronary arteries. Mediastinum/Nodes: No pathologically enlarged mediastinal or hilar lymph nodes. Several prominent borderline enlarged mediastinal lymph nodes are again noted, likely reactive. Esophagus is unremarkable in appearance. No axillary lymphadenopathy. Lungs/Pleura: New left-sided pigtail drainage catheter with tip reformed in the posterior aspect of the left hemithorax. Previously noted large left-sided pleural fluid collection has substantially decreased in size, although there continues to be a loculated component in the posterior aspect of the upper left hemithorax just cephalad to the superior segment of the left lower lobe slightly above the tip of the catheter. Trace volume of right pleural fluid, new compared to the prior examination. Patchy areas of atelectasis in the left lower lobe, overall with improved left lower lobe aeration compared to the prior examination. More well-defined area of cavitation in the inferior segment of the lingula than seen on the prior examination, presumably secondary to necrotizing pneumonia. Right lung is clear. Upper Abdomen: Unremarkable. Musculoskeletal: There are no aggressive appearing lytic or blastic lesions noted in the visualized portions of the skeleton. IMPRESSION: 1. Interval placement of pigtail drainage catheter in the left hemithorax with effective drainage of much of the previously noted left-sided empyema, although there continues  to be a small loculated component, as above. 2. Interval development of a small right pleural effusion. 3. Further cavitation of necrotizing pneumonia in the inferior segment of the lingula. Electronically Signed   By: Trudie Reed M.D.   On: 06/07/2021 11:06   MR HUMERUS RIGHT W WO CONTRAST  Result Date: 06/07/2021 CLINICAL DATA:  Right axillary soft tissue stranding and fluid seen on chest CT. Patient denies right upper arm complaints. History of IV drug abuse with empyema and necrotizing pneumonia. EXAM: MRI OF THE RIGHT HUMERUS WITHOUT AND WITH CONTRAST TECHNIQUE: Multiplanar, multisequence MR imaging of the right upper arm was performed before and after the administration of intravenous contrast. CONTRAST:  8.57mL GADAVIST GADOBUTROL 1 MMOL/ML IV SOLN COMPARISON:  CTA chest dated June 02, 2021. FINDINGS: Bones/Joint/Cartilage No marrow signal abnormality. No fracture or dislocation. Joint spaces are preserved. No joint effusion. Muscles and Tendons Intact.  No muscle edema or atrophy. Soft tissue No fluid collection or hematoma.  No soft tissue mass. Small right pleural effusion. IMPRESSION:  1. No evidence of soft tissue infection or septic arthritis. Stranding/fluid in the right axilla seen on recent CT has resolved. 2. Small right pleural effusion. Electronically Signed   By: Obie Dredge M.D.   On: 06/07/2021 10:50        Scheduled Meds:  acidophilus  2 capsule Oral Daily   enoxaparin (LOVENOX) injection  40 mg Subcutaneous Q24H   guaiFENesin  1,200 mg Oral BID   nicotine  21 mg Transdermal Daily   sodium chloride flush  10 mL Other Q8H   sodium chloride flush  10 mL Other Q8H   Continuous Infusions:  cefTRIAXone (ROCEPHIN)  IV     vancomycin 1,000 mg (06/08/21 0651)     LOS: 6 days        Kathlen Mody, MD [facility]   To contact the attending provider between 7A-7P or the covering provider during after hours 7P-7A, please log into the web site  [email or web address] and access using universal [place] password for that web site. If you do not have the password, please call the hospital operator.  06/08/2021, 11:55 AM

## 2021-06-08 NOTE — Progress Notes (Signed)
°   °  [address]       [place] [address]             [phone]       Cross-sectional imaging was reviewed.  There is been a good result from the chest tube placement and lytic therapy.  There remains 1 loculated area which appears amenable to image guided drainage.  That being said the patient is doing well from a pulmonary standpoint.  I do not think that she would need surgical drainage for that small fluid collection.  Please call with questions  Corliss Skains

## 2021-06-08 NOTE — Progress Notes (Signed)
NAME:  Sharon [name], MRN:  [id number], DOB:  01-May-1991, LOS: 6 ADMISSION DATE:  06/02/2021, CONSULTATION DATE:  06/02/2021 REFERRING MD:  Dr. 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  Found to have abscess on Lt leg.  She had chest xray that showed large left pleural effusion.  CT chest showed area of cavitation in the inferior lingula and large Lt sided pleural effusion.  PCCM asked to assess for drainage of pleural effusion.  Pertinent  Medical History  Anxiety, OA, Carpal tunnel, IVDA, Headache  Significant Hospital Events: Including procedures, antibiotic start and stop dates in addition to other pertinent events   12/08 Admit, ortho consulted, start ABx, Lt pig tail catheter placed, start tPA/dornase  Studies:  CT angio chest 06/02/21 >> large complex Lt effusion with pleural enhancement with partial loculation, ATX LLL, cavitary area lingula, splenomegaly Lt pleural fluid 06/02/21 >> glucose less than 20, protein 5.4, LDH 4696, 10625 cells (92% neutrophils) MRI12/02/2021 1. Rim-enhancing abscess within the medial subcutaneous soft tissues at the level of the tibial plateau measuring 3.6 x 2.0 x 2.9 cm. 2. Mild diffuse intramuscular edema involving the anterior and deep posterior compartments of the lower leg with associated perifascial edema, compatible with myofasciitis. No evidence of myonecrosis or pyomyositis. 3. Mild tenosynovitis associated with the posteromedial ankle tendons, which may be reactive or infectious. mri No evidence of osteomyelitis. 06/05/2021 tPA administered  Interim History / Subjective:   No acute events overnight. Breathing is comfortable.  Chest tube output 29mL in past 24 hours.  Objective   Blood pressure 112/69, pulse 81, temperature 97.8 F (36.6 C), temperature source Oral, resp. rate 16, height 5\' 3"  (1.6 m), weight  85.7 kg, last menstrual period 05/13/2021, SpO2 99 %.        Intake/Output Summary (Last 24 hours) at 06/08/2021 1144 Last data filed at 06/08/2021 1100 Gross per 24 hour  Intake 1766.57 ml  Output 51 ml  Net 1715.57 ml   Filed Weights   06/02/21 0036 06/02/21 0456 06/02/21 1331  Weight: 89.8 kg 86.4 kg 85.7 kg    Examination:  General: no acute distress HEENT: MM pink/moist Neuro: awake, alert, following commands CV: rrr, no murmur PULM: Decreased breath sounds in the bases GI: soft, bsx4 active  Extremities: warm/dry Skin: Cellulitis lower extremity    Resolved Hospital Problem list     Assessment & Plan:   Lt pleural space empyema with cavitary pneumonia in setting of IVDA and Leg abscess and cellulitis. - Pigtail placed 06/02/2021 - tPA started 06/02/2021 with repeat doses given on 12/9 and 12/11.  - CT Chest 12/13 with improvement in left loculated effusion with persistent loculated area of fluid of left upper hemithorax.  - CT Surgery is not recommending surgery at this time. Patient may require CT guided chest tube placement for left upper hemithorax loculation. - If minimal drainage again from existing chest tube, will plan for removal on 12/15  Best Practice (right click and "Reselect all SmartList Selections" daily)   Per primary Labs    CMP Latest Ref Rng & Units 06/08/2021 06/05/2021 06/05/2021  Glucose 70 - 99 mg/dL 97 14/04/2021) 782(N)  BUN 6 - 20 mg/dL 12 10 6   Creatinine 0.44 - 1.00 mg/dL 562(Z 3.08  Sodium 135 - 145 mmol/L 132(L) 134(L) 139  Potassium 3.5 - 5.1 mmol/L 4.8 3.9 3.7  Chloride 98 - 111 mmol/L 104 106 107  CO2 22 - 32 mmol/L 25 24 24   Calcium 8.9 - 10.3 mg/dL 8.2(L) 7.6(L) 8.0(L)  Total Protein 6.5 - 8.1 g/dL - - -  Total Bilirubin 0.3 - 1.2 mg/dL - - -  Alkaline Phos 38 - 126 U/L - - -  AST 15 - 41 U/L - - -  ALT 0 - 44 U/L - - -    CBC Latest Ref Rng & Units 06/08/2021 06/05/2021 06/05/2021  WBC 4.0 - 10.5 K/uL 6.3 7.0 6.9   Hemoglobin 12.0 - 15.0 g/dL 8.9(L) 8.2(L) 8.5(L)  Hematocrit 36.0 - 46.0 % 29.5(L) 27.4(L) 28.0(L)  Platelets 150 - 400 K/uL 380 413(H) 492(H)    Signature:   Freda Jackson, MD [facility] Office: [phone]   See Amion for personal pager PCCM on call pager [phone] until 7pm. Please call Elink 7p-7a. [phone]

## 2021-06-09 ENCOUNTER — Inpatient Hospital Stay (HOSPITAL_COMMUNITY): Payer: Medicaid Other

## 2021-06-09 DIAGNOSIS — L0291 Cutaneous abscess, unspecified: Secondary | ICD-10-CM

## 2021-06-09 DIAGNOSIS — F1910 Other psychoactive substance abuse, uncomplicated: Secondary | ICD-10-CM | POA: Diagnosis not present

## 2021-06-09 DIAGNOSIS — J850 Gangrene and necrosis of lung: Secondary | ICD-10-CM | POA: Diagnosis not present

## 2021-06-09 DIAGNOSIS — J869 Pyothorax without fistula: Secondary | ICD-10-CM | POA: Diagnosis not present

## 2021-06-09 LAB — BODY FLUID CULTURE W GRAM STAIN: Culture: NO GROWTH

## 2021-06-09 LAB — CREATININE, SERUM
Creatinine, Ser: 0.92 mg/dL (ref 0.44–1.00)
GFR, Estimated: >60 mL/min (ref >60)

## 2021-06-09 LAB — BASIC METABOLIC PANEL
Anion gap: 6 (ref 5–15)
BUN: 9 mg/dL (ref 6–20)
CO2: 26 mmol/L (ref 22–32)
Calcium: 8.6 mg/dL — ABNORMAL LOW (ref 8.9–10.3)
Chloride: 103 mmol/L (ref 98–111)
Creatinine, Ser: 0.84 mg/dL (ref 0.44–1.00)
GFR, Estimated: >60 mL/min (ref >60)
Glucose, Bld: 99 mg/dL (ref 70–99)
Potassium: 4.2 mmol/L (ref 3.5–5.1)
Sodium: 135 mmol/L (ref 135–145)

## 2021-06-09 LAB — VANCOMYCIN, TROUGH: Vancomycin Tr: 17 ug/mL (ref 15–20)

## 2021-06-09 LAB — VANCOMYCIN, PEAK: Vancomycin Pk: 40 ug/mL (ref 30–40)

## 2021-06-09 IMAGING — CT CT CHEST W/O CM
2 of 4 series · 15 of 36 positions shown, 18 images · non-contrast
Comparison: Previous studies including the examination of
[DATE]

CLINICAL DATA: Empyema with previous chest tube placement

EXAM:
CT CHEST WITHOUT CONTRAST
TECHNIQUE: Multidetector CT imaging of the chest was performed following the
standard protocol without IV contrast.

[Series 3: chest wo · axial · 0.76mm/px · z∈[-382,-118]mm · 12 of 155 slices shown, 15 images]
[im 12/155  mediastinal]
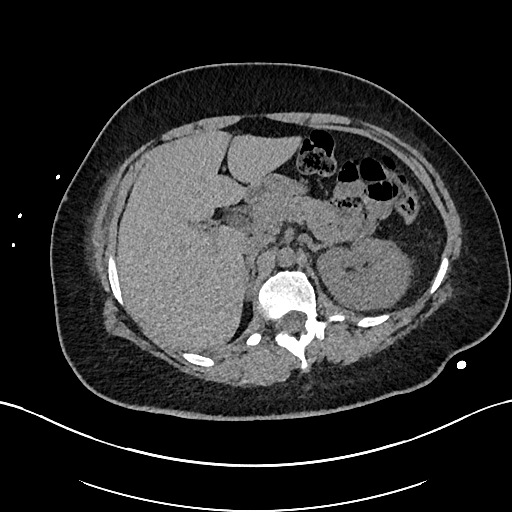
[im 12/155  lung]
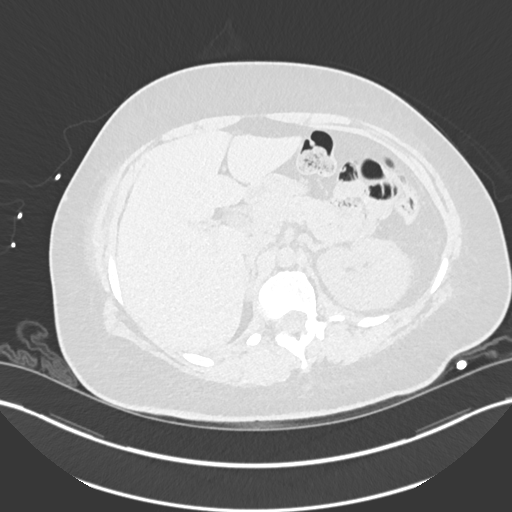
[im 23/155  lung]
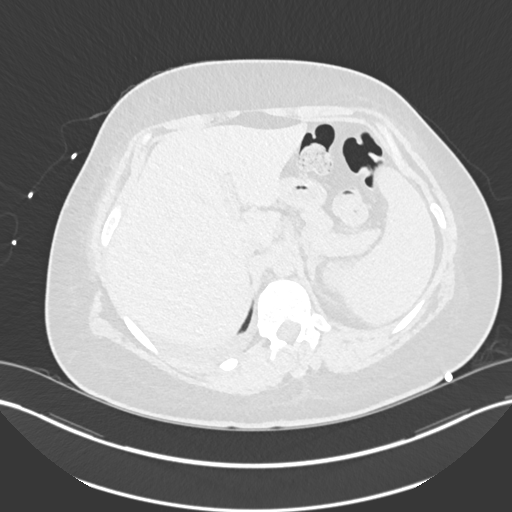
[im 34/155  lung]
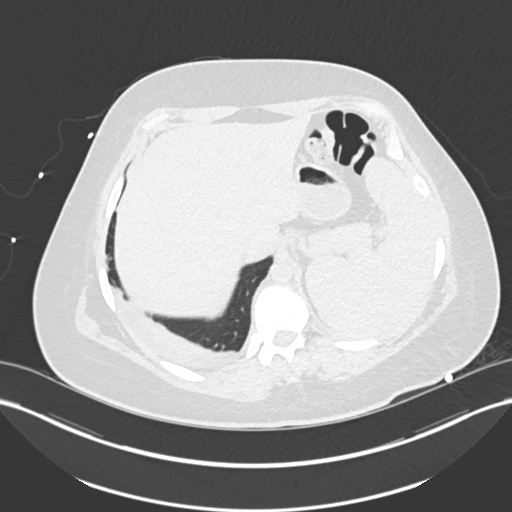
[im 45/155  lung]
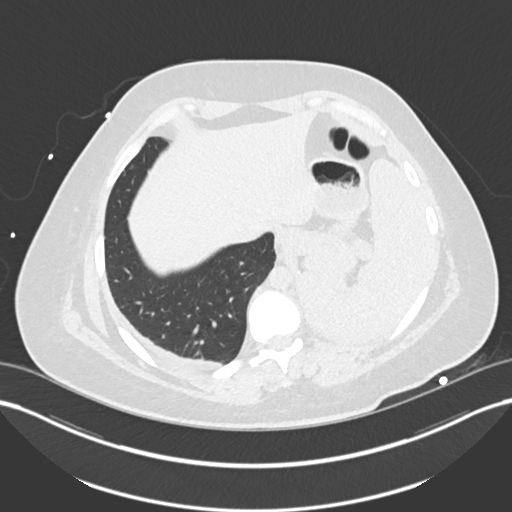
[im 56/155  mediastinal]
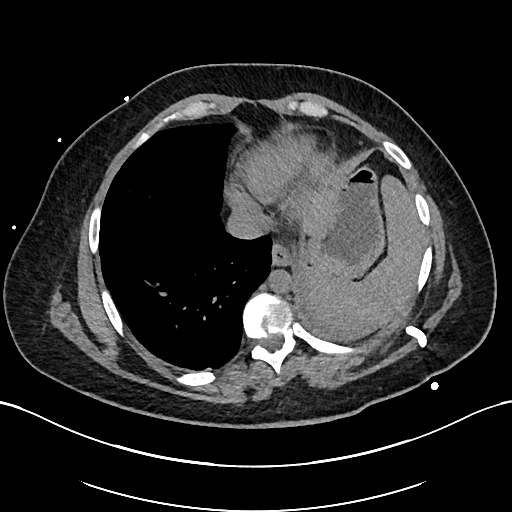
[im 56/155  lung]
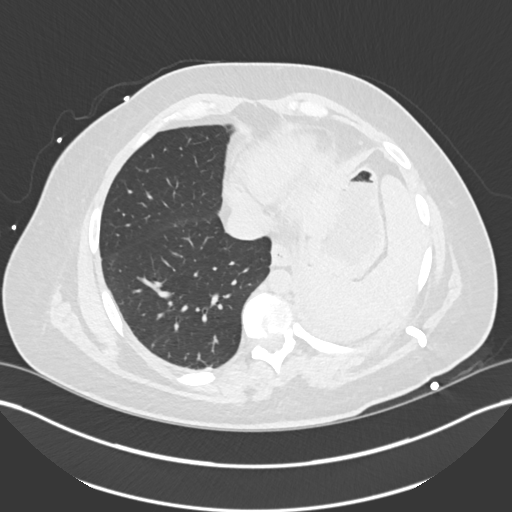
[im 67/155  lung]
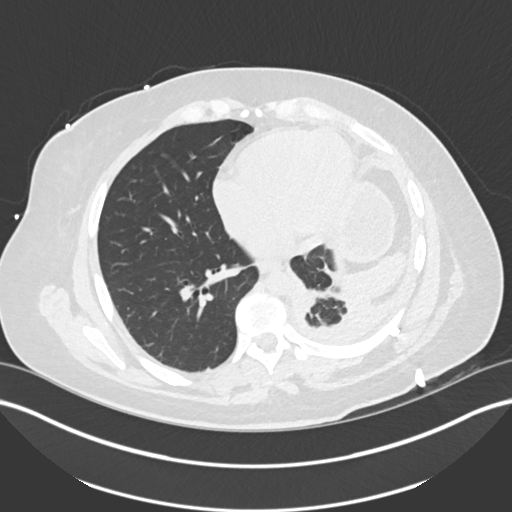
[im 89/155  lung]
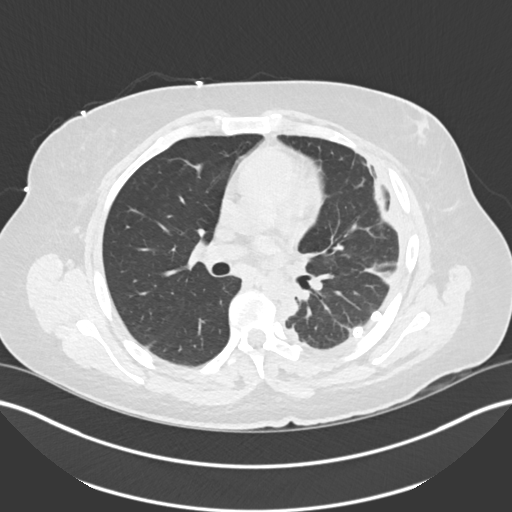
[im 100/155  lung]
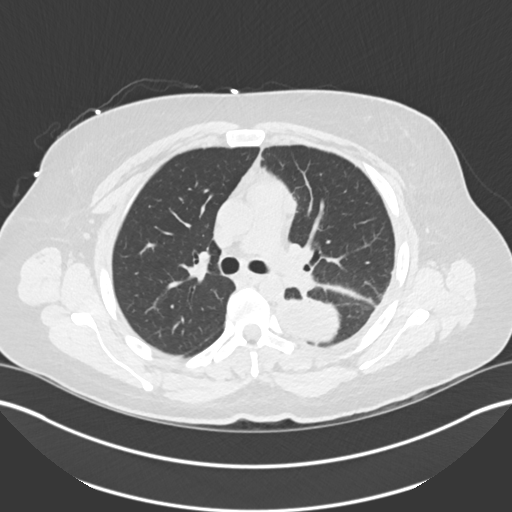
[im 111/155  mediastinal]
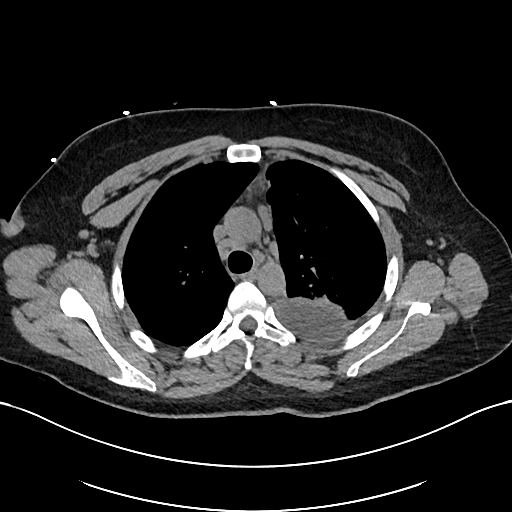
[im 111/155  lung]
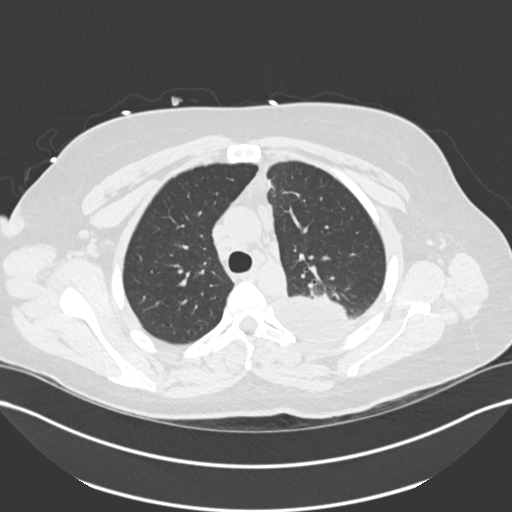
[im 122/155  lung]
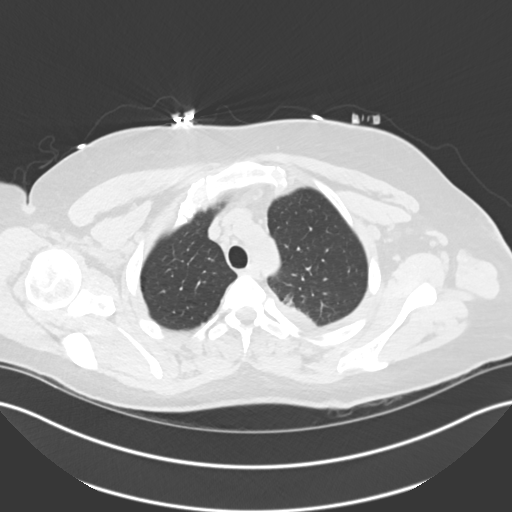
[im 133/155  lung]
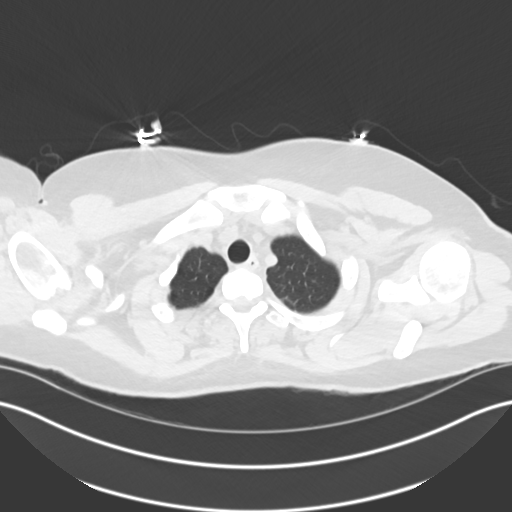
[im 144/155  lung]
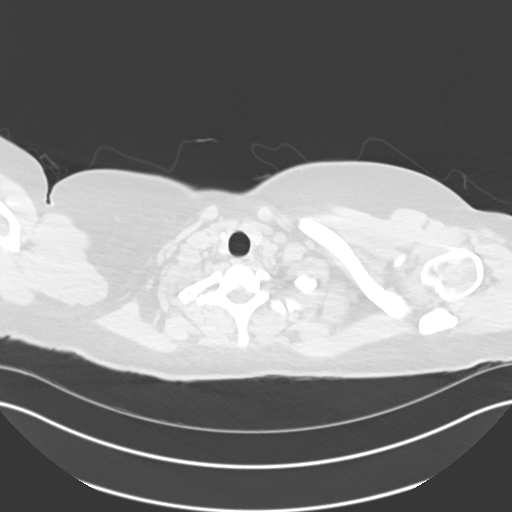

[Series 6: cor · coronal · 0.64mm/px · 3 of 155 slices shown]
[im 31/155  lung]
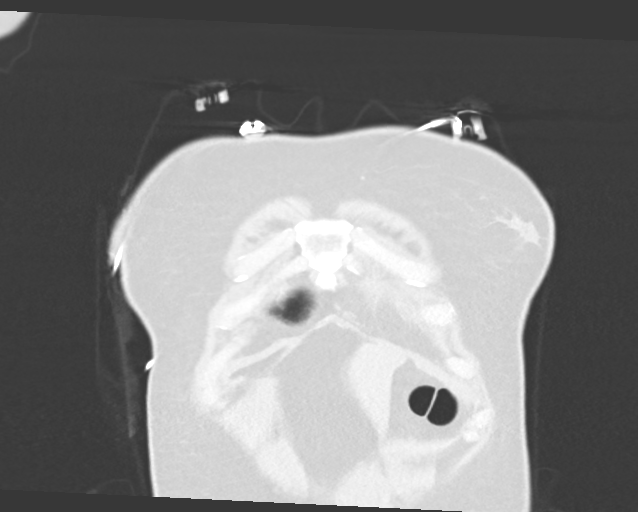
[im 62/155  lung]
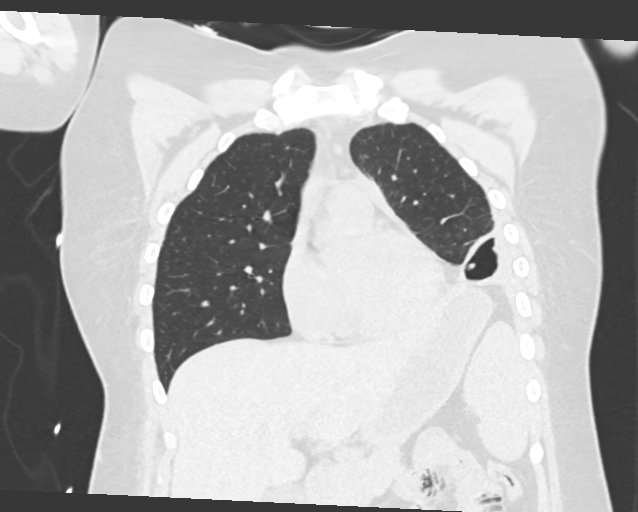
[im 93/155  lung]
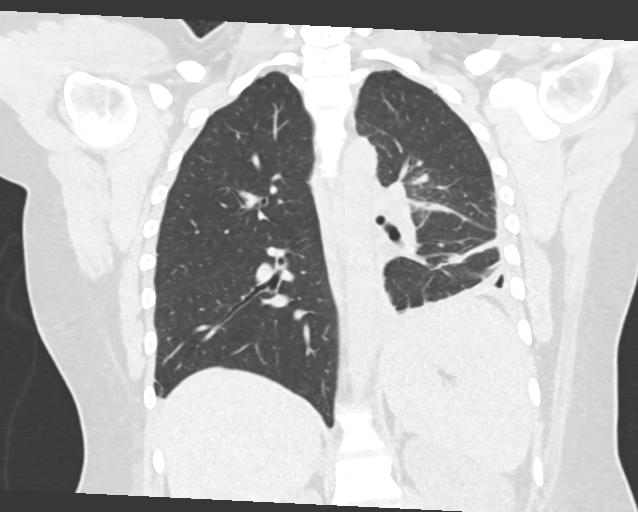

[15 of 36 positions shown; findings below may reference images not displayed]

FINDINGS: Cardiovascular: Unremarkable.

Mediastinum/Nodes: There are enlarged lymph nodes in the mediastinum
largest measuring 2.1 by 1 cm. This finding has not changed
significantly.

Lungs/Pleura: Right lung is essentially clear. There is an
infiltrate with cavitation in the lingular segment of left upper
lobe which has not changed significantly. Cavitation seen in the
lateral aspect of the infiltrate measures proximally 4.3 cm in
maximum diameter and is inseparable from the pleural surface. This
may either suggest extension of empyema or lung abscess. There is
interval decrease in small patchy infiltrates in the left lower
lobe. There are no new focal pulmonary infiltrates. There is 6.3 x
4.4 cm loculated fluid collection in the posterior left mid lung
fields which appears smaller in size. Left chest tube is noted with
its tip in the posterior aspect. There is small residual effusion in
the medial aspect of left posterior costophrenic angle. There is a
linear loculated fluid collection in the right posterior
costophrenic angle measuring 10 x 2.1 cm with possible decrease.
There is no pneumothorax.

Upper Abdomen: Spleen is enlarged measuring 14.2 cm in AP diameter.
There are low-density foci in the gallbladder lumen suggesting
gallstones.

Musculoskeletal: Unremarkable. Inflammatory stranding seen in the
soft tissues in the right axilla in the previous examination done on
[DATE] has resolved.
IMPRESSION: There are patchy infiltrates in the left lung with interval
improvement. There is interval decrease in size of loculated
effusion in the posterior left mid lung fields. There is small
residual effusion in the left posterior costophrenic angle. There is
linear pleural effusion in the posterior right lower lung fields
with possible slight decrease in size. There are no new infiltrates
or new significant pleural effusion.

Enlarged lymph nodes in the mediastinum have not changed
significantly. Enlarged spleen. Gallbladder stones.

## 2021-06-09 MED ORDER — TRAMADOL HCL 50 MG PO TABS
100.0000 mg | ORAL_TABLET | Freq: Four times a day (QID) | ORAL | Status: DC | PRN
  Administered 2021-06-09 – 2021-06-10 (×3): 100 mg via ORAL
  Filled 2021-06-09 (×3): qty 2

## 2021-06-09 MED ORDER — VANCOMYCIN VARIABLE DOSE PER UNSTABLE RENAL FUNCTION (PHARMACIST DOSING)
Status: DC
Start: 2021-06-09 — End: 2021-06-10

## 2021-06-09 MED ORDER — KETOROLAC TROMETHAMINE 15 MG/ML IJ SOLN
15.0000 mg | Freq: Three times a day (TID) | INTRAMUSCULAR | Status: DC | PRN
  Filled 2021-06-09: qty 1

## 2021-06-09 MED ORDER — ADULT MULTIVITAMIN W/MINERALS CH
1.0000 | ORAL_TABLET | Freq: Every day | ORAL | Status: DC
  Administered 2021-06-09 – 2021-06-10 (×2): 1 via ORAL
  Filled 2021-06-09 (×2): qty 1

## 2021-06-09 NOTE — Progress Notes (Signed)
PROGRESS NOTE    Sharon [name]  RJJ:[id number] DOB: 1990-10-10 DOA: 06/02/2021 PCP: Patient, No Pcp Per (Inactive)   Chief Complaint  Patient presents with   Abscess    Brief Narrative:   ALAIYAH [name] is a 30 y.o. female with a history of IVDU, tobacco use, anxiety who presented to the ED on 12/8 with infected wound of the left leg, painful swelling of the right ankle, and fever with cough. She was tachycardic with borderline hypotension, given IVF. WBC 20k. CTA chest revealed cavitary pneumonia with associated loculated left pleural effusion. PCCM was consulted, placed pigtail catheter and is proceeding with fibrinolytic. Right ankle was tapped with no fluid return in ED. Swelling has improved with IV antibiotics alone, so orthopedics not currently recommending surgical management. Blood culture and pleural fluid culture are pending, echocardiogram ordered. ID guiding antibiotics, changed now to vancomycin and cefepime. Additional abscess on MR of right leg which has ruptured prior to I&D. Right ankle arthrocentesis performed 12/12. Cardiothoracic surgery is consulted for empyema, consideration of VATS.   Assessment & Plan:   Principal Problem:   Sepsis (HCC) Active Problems:   IV drug abuse (HCC)   Substance abuse (HCC)   Empyema lung (HCC)   Chest tube in place   Abscess   Sepsis due to multiple infections , concern for bacteremia in IVDU:  - Resume vancomycin and cefepime.  Echocardiogram - No vegetations.    Cavitary lingula pneumonia and empyema:  PCCM on board, appreciate recommendations.  CT surgery on board and s/p chest tube placement on 06/02/2021.  Left drainage has stopped. Plan to remove the current pigtail catheter. CT chest ordered to evaluate for residual fluid in the left upper lobe. - ID on board and recommended to continue with IV fluids.  - Pleural fluid cultures are negative so far.   Right Lower extremity abscesses and cellulitis:  Right ankle  synovial culture NGTD.  Lower extremity duplex negative for DVT.    Left calf Abscess:  - spontaneous draining.  -  bandaged.  - continue with IV antibiotics.   IVDU, narcotic use,  No signs of withdrawal.    Hypokalemia:  Replaced.   Microcytic anemia:  S/p 1 unit of transfusion.  Hemoglobin stable post transfusion.  Hemoglobin around 8.9.    Hypoalbuminemia:  Dietary consulted.    Tobacco abuse:  On nicotine patch.   Obesity:  Body mass index is 33.47 kg/m. Increased risk of morbidity and mortality.     DVT prophylaxis: (Lovenox) Code Status: (Full code) Family Communication: family at bedside.  Disposition:   Status is: Inpatient  Remains inpatient appropriate because: IV antibiotics.       Consultants:  ID  Procedures:  Antimicrobials: Antibiotics Given (last 72 hours)     Date/Time Action Medication Dose Rate   06/06/21 1433 New Bag/Given   ceFEPIme (MAXIPIME) 2 g in sodium chloride 0.9 % 100 mL IVPB 2 g 200 mL/hr   06/06/21 1752 New Bag/Given   vancomycin (VANCOCIN) IVPB 1000 mg/200 mL premix 1,000 mg 200 mL/hr   06/06/21 2346 New Bag/Given   ceFEPIme (MAXIPIME) 2 g in sodium chloride 0.9 % 100 mL IVPB 2 g 200 mL/hr   06/07/21 0602 New Bag/Given   ceFEPIme (MAXIPIME) 2 g in sodium chloride 0.9 % 100 mL IVPB 2 g 200 mL/hr   06/07/21 0718 New Bag/Given   vancomycin (VANCOCIN) IVPB 1000 mg/200 mL premix 1,000 mg 200 mL/hr   06/07/21 1412 New Bag/Given   ceFEPIme (  MAXIPIME) 2 g in sodium chloride 0.9 % 100 mL IVPB 2 g 200 mL/hr   06/07/21 1748 New Bag/Given   vancomycin (VANCOCIN) IVPB 1000 mg/200 mL premix 1,000 mg 200 mL/hr   06/07/21 2150 New Bag/Given   ceFEPIme (MAXIPIME) 2 g in sodium chloride 0.9 % 100 mL IVPB 2 g 200 mL/hr   06/08/21 0553 New Bag/Given   ceFEPIme (MAXIPIME) 2 g in sodium chloride 0.9 % 100 mL IVPB 2 g 200 mL/hr   06/08/21 0651 New Bag/Given   vancomycin (VANCOCIN) IVPB 1000 mg/200 mL premix 1,000 mg 200 mL/hr    06/08/21 1435 New Bag/Given   cefTRIAXone (ROCEPHIN) 2 g in sodium chloride 0.9 % 100 mL IVPB 2 g 200 mL/hr   06/08/21 1722 New Bag/Given   vancomycin (VANCOCIN) IVPB 1000 mg/200 mL premix 1,000 mg 200 mL/hr   06/09/21 0529 New Bag/Given   vancomycin (VANCOCIN) IVPB 1000 mg/200 mL premix 1,000 mg 200 mL/hr        Subjective: Reports pain in the leg. No chest pain or sob. No nausea or vomiting.   Objective: Vitals:   06/08/21 2323 06/09/21 0332 06/09/21 0829 06/09/21 1253  BP: 104/60 106/65 119/76 119/73  Pulse: 89 86 98 98  Resp: Temp: 98.7 F (37.1 C) 98.2 F (36.8 C) 98.1 F (36.7 C) (!) 97.5 F (36.4 C)  TempSrc: Oral Oral Oral Oral  SpO2: 93% 96% 95% 95%  Weight:      Height:        Intake/Output Summary (Last 24 hours) at 06/09/2021 1300 Last data filed at 06/09/2021 0803 Gross per 24 hour  Intake 717 ml  Output 0 ml  Net 717 ml    Filed Weights   06/02/21 0036 06/02/21 0456 06/02/21 1331  Weight: 89.8 kg 86.4 kg 85.7 kg    Examination:  General exam: Appears calm and comfortable  Respiratory system: Clear to auscultation. Respiratory effort normal. Cardiovascular system: S1 & S2 heard, RRR. No JVD,  No pedal edema. Gastrointestinal system: Abdomen is nondistended, soft and non tender. Normal bowel sounds heard. Central nervous system: Alert and oriented. No focal neurological deficits. Extremities:  left leg wrapped in bandages.  Skin: No rashes,.  Psychiatry: Mood & affect appropriate.      Data Reviewed: I have personally reviewed following labs and imaging studies  CBC: Recent Labs  Lab 06/03/21 0152 06/03/21 1229 06/03/21 2340 06/05/21 0428 06/05/21 2313 06/08/21 0125  WBC 12.5*  --  8.5 6.9 7.0 6.3  NEUTROABS  --   --  5.4 4.7  --   --   HGB 7.1* 6.4* 8.1* 8.5* 8.2* 8.9*  HCT 23.5* 22.0* 26.1* 28.0* 27.4* 29.5*  MCV 75.1*  --  75.4* 77.1* 79.0* 78.9*  PLT 407*  --  420* 492* 413* 380     Basic Metabolic  Panel: Recent Labs  Lab 06/03/21 0152 06/03/21 2340 06/05/21 0428 06/05/21 2313 06/08/21 0125 06/09/21 0811  NA 133* 135 139 134* 132* 135  K 4.2 4.4 3.7 3.9 4.8 4.2  CL 101 105 107 106 104 103  CO2 GLUCOSE 102* 102* 120* 135* 97 99  BUN CREATININE 0.56 0.49 0.61 0.47 0.52 0.84  CALCIUM 7.4* 7.6* 8.0* 7.6* 8.2* 8.6*  MG 1.7  --  2.0 1.8  --   --   PHOS  --   --  3.1 3.8  --   --  GFR: Estimated Creatinine Clearance: 101.6 mL/min (by C-G formula based on SCr of 0.84 mg/dL).  Liver Function Tests: Recent Labs  Lab 06/03/21 2340  AST 27  ALT QUANTITY NOT SUFFICIENT, UNABLE TO PERFORM TEST  ALKPHOS 69  BILITOT QUANTITY NOT SUFFICIENT, UNABLE TO PERFORM TEST  PROT 6.2*  ALBUMIN <1.5*     CBG: No results for input(s): GLUCAP in the last 168 hours.   Recent Results (from the past 240 hour(s))  Resp Panel by RT-PCR (Flu A&B, Covid) Nasopharyngeal Swab     Status: None   Collection Time: 06/02/21  4:50 AM   Specimen: Nasopharyngeal Swab; Nasopharyngeal(NP) swabs in vial transport medium  Result Value Ref Range Status   SARS Coronavirus 2 by RT PCR NEGATIVE NEGATIVE Final    Comment: (NOTE) SARS-CoV-2 target nucleic acids are NOT DETECTED.  The SARS-CoV-2 RNA is generally detectable in upper respiratory specimens during the acute phase of infection. The lowest concentration of SARS-CoV-2 viral copies this assay can detect is 138 copies/mL. A negative result does not preclude SARS-Cov-2 infection and should not be used as the sole basis for treatment or other patient management decisions. A negative result may occur with  improper specimen collection/handling, submission of specimen other than nasopharyngeal swab, presence of viral mutation(s) within the areas targeted by this assay, and inadequate number of viral copies(<138 copies/mL). A negative result must be combined with clinical observations, patient history, and  epidemiological information. The expected result is Negative.  Fact Sheet for Patients:  BloggerCourse.com  Fact Sheet for Healthcare Providers:  SeriousBroker.it  This test is no t yet approved or cleared by the [place] FDA and  has been authorized for detection and/or diagnosis of SARS-CoV-2 by FDA under an Emergency Use Authorization (EUA). This EUA will remain  in effect (meaning this test can be used) for the duration of the COVID-19 declaration under Section 564(b)(1) of the Act, 21 U.S.C.section 360bbb-3(b)(1), unless the authorization is terminated  or revoked sooner.       Influenza A by PCR NEGATIVE NEGATIVE Final   Influenza B by PCR NEGATIVE NEGATIVE Final    Comment: (NOTE) The Xpert Xpress SARS-CoV-2/FLU/RSV plus assay is intended as an aid in the diagnosis of influenza from Nasopharyngeal swab specimens and should not be used as a sole basis for treatment. Nasal washings and aspirates are unacceptable for Xpert Xpress SARS-CoV-2/FLU/RSV testing.  Fact Sheet for Patients: BloggerCourse.com  Fact Sheet for Healthcare Providers: SeriousBroker.it  This test is not yet approved or cleared by the [place] FDA and has been authorized for detection and/or diagnosis of SARS-CoV-2 by FDA under an Emergency Use Authorization (EUA). This EUA will remain in effect (meaning this test can be used) for the duration of the COVID-19 declaration under Section 564(b)(1) of the Act, 21 U.S.C. section 360bbb-3(b)(1), unless the authorization is terminated or revoked.  Performed at [facility], [address] [place], [place] [address]   Blood Culture (routine x 2)     Status: None   Collection Time: 06/02/21  4:50 AM   Specimen: Left Antecubital; Blood  Result Value Ref Range Status   Specimen Description   Final    LEFT ANTECUBITAL Performed at [facility], [address] [place], [place] [address]    Special Requests   Final    BOTTLES DRAWN AEROBIC AND ANAEROBIC Blood Culture adequate volume Performed at [facility], [address] [place], [place] [address]  Culture   Final    NO GROWTH 5 DAYS Performed at [facility], [address], [place], [place] [address]    Report Status 06/07/2021 FINAL  Final  Urine Culture     Status: Abnormal   Collection Time: 06/02/21  7:45 AM   Specimen: In/Out Cath Urine  Result Value Ref Range Status   Specimen Description   Final    IN/OUT CATH URINE Performed at [facility], [address] [place], [place] [address]    Special Requests   Final    NONE Performed at [facility], [address] [place], [place] [address]    Culture MULTIPLE SPECIES PRESENT, SUGGEST RECOLLECTION (A)  Final   Report Status 06/03/2021 FINAL  Final  Body fluid culture w Gram Stain     Status: None   Collection Time: 06/02/21  3:01 PM   Specimen: Pleural Fluid  Result Value Ref Range Status   Specimen Description PLEURAL FLUID LEFT  Final   Special Requests NONE  Final   Gram Stain   Final    FEW WBC PRESENT, PREDOMINANTLY PMN NO ORGANISMS SEEN    Culture   Final    NO GROWTH Performed at [facility], [address], [place], [place] [address]    Report Status 06/08/2021 FINAL  Final  Fungus Culture With Stain     Status: None (Preliminary result)   Collection Time: 06/02/21  3:01 PM   Specimen: Pleural Fluid  Result Value Ref Range Status   Fungus Stain Final report  Final    Comment: (NOTE) Performed At: [facility] [address] [place], [place] [id number] Jolene Schimke MD [id number]    Fungus (Mycology) Culture PENDING  Incomplete   Fungal Source PLEURAL  Final    Comment: FLUID LEFT Performed at [facility], [address] [place], [place] [address]   Acid Fast Smear (AFB)     Status: None    Collection Time: 06/02/21  3:01 PM   Specimen: Pleural Fluid  Result Value Ref Range Status   AFB Specimen Processing Concentration  Final   Acid Fast Smear Negative  Final    Comment: (NOTE) Performed At: [facility] [address] [place], [place] [id number] Jolene Schimke MD QQ:[id number]    Source (AFB) PLEURAL  Final    Comment: FLUID LEFT Performed at [facility], [address] [place], [place] [address]   Fungus Culture Result     Status: None   Collection Time: 06/02/21  3:01 PM  Result Value Ref Range Status   Result 1 Comment  Final    Comment: (NOTE) KOH/Calcofluor preparation:  no fungus observed. Performed At: [facility] [address] [place], [place] [id number] Jolene Schimke MD [id number]   Culture, blood (Routine X 2) w Reflex to ID Panel     Status: None   Collection Time: 06/03/21 12:21 PM   Specimen: BLOOD LEFT HAND  Result Value Ref Range Status   Specimen Description BLOOD LEFT HAND  Final   Special Requests   Final    BOTTLES DRAWN AEROBIC AND ANAEROBIC Blood Culture results may not be optimal due to an inadequate volume of blood received in culture bottles   Culture   Final    NO GROWTH 5 DAYS Performed at [facility], [address] [place], [place] [address]    Report Status 06/08/2021 FINAL  Final  Culture, blood (  Routine X 2) w Reflex to ID Panel     Status: None   Collection Time: 06/03/21 12:31 PM   Specimen: BLOOD LEFT HAND  Result Value Ref Range Status   Specimen Description BLOOD LEFT HAND  Final   Special Requests   Final    BOTTLES DRAWN AEROBIC ONLY Blood Culture results may not be optimal due to an inadequate volume of blood received in culture bottles   Culture   Final    NO GROWTH 5 DAYS Performed at [facility], [address], [place], [place] [address]    Report Status 06/08/2021 FINAL  Final  Surgical PCR screen     Status: None   Collection Time: 06/05/21  9:49 PM   Specimen:  Nasal Mucosa; Nasal Swab  Result Value Ref Range Status   MRSA, PCR NEGATIVE NEGATIVE Final   Staphylococcus aureus NEGATIVE NEGATIVE Final    Comment: (NOTE) The Xpert SA Assay (FDA approved for NASAL specimens in patients 35 years of age and older), is one component of a comprehensive surveillance program. It is not intended to diagnose infection nor to guide or monitor treatment. Performed at [facility], [address], [place], [place] [address]   Body fluid culture w Gram Stain     Status: None (Preliminary result)   Collection Time: 06/06/21 11:38 AM   Specimen: Synovium; Synovial Fluid  Result Value Ref Range Status   Specimen Description SYNOVIAL FLUID  Final   Special Requests RIGHT ANKLE  Final   Gram Stain   Final    RARE WBC PRESENT, PREDOMINANTLY MONONUCLEAR NO ORGANISMS SEEN    Culture   Final    NO GROWTH 3 DAYS Performed at [facility], [address], [place], [place] [address]    Report Status PENDING  Incomplete          Radiology Studies: No results found.      Scheduled Meds:  acidophilus  2 capsule Oral Daily   enoxaparin (LOVENOX) injection  40 mg Subcutaneous Q24H   guaiFENesin  1,200 mg Oral BID   multivitamin with minerals  1 tablet Oral Daily   nicotine  21 mg Transdermal Daily   sodium chloride flush  10 mL Other Q8H   Continuous Infusions:  cefTRIAXone (ROCEPHIN)  IV 2 g (06/08/21 1435)   vancomycin 1,000 mg (06/09/21 0529)     LOS: 7 days        Kathlen Mody, MD [facility]   To contact the attending provider between 7A-7P or the covering provider during after hours 7P-7A, please log into the web site [email or web address] and access using universal [place] password for that web site. If you do not have the password, please call the hospital operator.  06/09/2021, 1:00 PM

## 2021-06-09 NOTE — Progress Notes (Signed)
Pharmacy Antibiotic Note  Sharon [name] is a 30 y.o. female with hx IVDU admitted on 06/02/2021 with  leg wound infections  and L lung empyema and cavitary pneumonia .Pharmacy was consulted for vancomycin and cefepime dosing. Cefepime was switched to ceftriaxone on 06/08/21.  WBC 6.3, afebrile; Scr 0.92 (trended up from 0.69 on admission), CrCl 92.7 ml/min  Steady-state vancomycin levels were drawn today on vancomycin 1 gm IV Q 12 hrs regimen (dose given at 0529 AM today): Vancomycin peak (drawn at 0811 AM): 40 mg/L Vancomycin trough (drawn at 1810 PM): 17 mg/L Vancomycin AUC calculated from the above levels was 724, which is above the vancomycin AUC goal of 400-550 (elevated AUC likely due to rising Scr and reduced vancomycin clearance)  Pt rec'd evening vancomycin dose of 1 gm IV at 1833 PM tonight (prior to vanc trough result available)  Per ID, planning to transition to Augmentin and doxycycline at discharge for prolonged course due to empyema and cavitary pneumonia.  Plan: Given pt's unstable and worsening renal function, will dose vancomycin by levels at this point until renal function stabilizes; check vancomycin random level, SCr with AM labs to evaluate vancomycin clearance and renal function trends to determine subsequent dosing strategy Monitor WBC, temp, clinical course, cultures, renal function, vancomycin levels  Height: 5\' 3"  (160 cm) Weight: 85.7 kg (188 lb 15 oz) IBW/kg (Calculated) : 52.4  Temp (24hrs), Avg:98.2 F (36.8 C), Min:97.5 F (36.4 C), Max:98.7 F (37.1 C)  Recent Labs  Lab 06/03/21 0152 06/03/21 2340 06/05/21 0428 06/05/21 2313 06/06/21 1418 06/08/21 0125 06/09/21 0811  WBC 12.5* 8.5 6.9 7.0  --  6.3  --   CREATININE 0.56 0.49 0.61 0.47  --  0.52 0.84  VANCOPEAK  --   --   --  107*  --   --  40  VANCORANDOM  --   --   --   --  6  --   --      Estimated Creatinine Clearance: 101.6 mL/min (by C-G formula based on SCr of 0.84 mg/dL).    No  Known Allergies  Antimicrobials this admission: Vancomycin 12/8 >> Cefepime 12/8 > 12/14 Metronidazole IV 12/18 > 12/9 Ceftriaxone 12/14 >>  Microbiology results: 12/8 Bcx: NG/final 12/8 Ucx: multiple species, recollection recommended 12/9 Bcx  X 2: NG/final 12/8 pleural fluid cx's: AFB smear negative, cx pending; bacterial cx was no growth; KOH negive, fungal cx pending 12/8 COVID, flu A, flu B: negative 12/9 Chlamydia, gonorrhea, RPR, HBsAg, HB core Ab, HCV Ab, HIV screen: negative 12/9 HB sAb: positive 12/12 R ankle synovial fluid cx: NG/final  Thank you for including pharmacy in the care of this patient.  14/9, PharmD, BCPS, [facility] Clinical Pharmacist  .

## 2021-06-09 NOTE — Progress Notes (Signed)
Removed pigtail catheter and applied petroleum gauze and 4x4 gauze. Pt tolerated the procedure well without issue. Pt resting with call bell within reach.  Will continue to monitor.

## 2021-06-09 NOTE — Progress Notes (Signed)
NAME:  Sharon [name], MRN:  [id number], DOB:  11-29-1990, LOS: 7 ADMISSION DATE:  06/02/2021, CONSULTATION DATE:  06/02/2021 REFERRING MD:  Dr. 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  Found to have abscess on Lt leg.  She had chest xray that showed large left pleural effusion.  CT chest showed area of cavitation in the inferior lingula and large Lt sided pleural effusion.  PCCM asked to assess for drainage of pleural effusion.  Pertinent  Medical History  Anxiety, OA, Carpal tunnel, IVDA, Headache  Significant Hospital Events: Including procedures, antibiotic start and stop dates in addition to other pertinent events   12/08 Admit, ortho consulted, start ABx, Lt pig tail catheter placed, start tPA/dornase  Studies:  CT angio chest 06/02/21 >> large complex Lt effusion with pleural enhancement with partial loculation, ATX LLL, cavitary area lingula, splenomegaly Lt pleural fluid 06/02/21 >> glucose less than 20, protein 5.4, LDH 4696, 10625 cells (92% neutrophils) MRI12/02/2021 1. Rim-enhancing abscess within the medial subcutaneous soft tissues at the level of the tibial plateau measuring 3.6 x 2.0 x 2.9 cm. 2. Mild diffuse intramuscular edema involving the anterior and deep posterior compartments of the lower leg with associated perifascial edema, compatible with myofasciitis. No evidence of myonecrosis or pyomyositis. 3. Mild tenosynovitis associated with the posteromedial ankle tendons, which may be reactive or infectious. mri No evidence of osteomyelitis. tPA started 06/02/2021 with repeat doses given on 12/9 and 12/11.  12/13 CT chest > most of the left sided fluid has drained but there is still a sizeable collection in the left upper/medial pleural space which has not drained, chest tube in place  Interim History / Subjective:   No chest tube out put  documented Feels about the same, breathing has improved compared to baseline  Objective   Blood pressure 119/76, pulse 98, temperature 98.1 F (36.7 C), temperature source Oral, resp. rate 20, height 5\' 3"  (1.6 m), weight 85.7 kg, last menstrual period 05/13/2021, SpO2 95 %.        Intake/Output Summary (Last 24 hours) at 06/09/2021 1207 Last data filed at 06/09/2021 06/11/2021 Gross per 24 hour  Intake 717 ml  Output 0 ml  Net 717 ml   Filed Weights   06/02/21 0036 06/02/21 0456 06/02/21 1331  Weight: 89.8 kg 86.4 kg 85.7 kg    Examination:  General:  Resting comfortably in bed HENT: NCAT OP clear PULM: CTA on right, diminished on left, normal effort CV: RRR, no mgr GI: BS+, soft, nontender MSK: normal bulk and tone Neuro: awake, alert, no distress, MAEW   Resolved Hospital Problem list     Assessment & Plan:   Lt pleural space empyema with cavitary pneumonia in setting of IVDA and Leg abscess and cellulitis. At this point the left drainage catheter has stopped putting out fluid Will remove the current pigtail and order a CT chest to see if there is residual fluid in the left upper lobe as there is significant risk of MRSA empyema recurrence if the fluid is incompletely drained  Best Practice (right click and "Reselect all SmartList Selections" daily)   Per primary Labs    CMP Latest Ref Rng & Units 06/09/2021 06/08/2021 06/05/2021  Glucose 70 - 99 mg/dL 99 97 14/04/2021)  BUN 6 - 20 mg/dL 9 12 10   Creatinine 0.44 - 1.00 mg/dL 710(G 2.69  Sodium 135 - 145 mmol/L 135 132(L) 134(L)  Potassium 3.5 - 5.1 mmol/L 4.2 4.8 3.9  Chloride 98 - 111 mmol/L 103 104 106  CO2 22 - 32 mmol/L 26 25 24   Calcium 8.9 - 10.3 mg/dL 8.6(L) 8.2(L) 7.6(L)  Total Protein 6.5 - 8.1 g/dL - - -  Total Bilirubin 0.3 - 1.2 mg/dL - - -  Alkaline Phos 38 - 126 U/L - - -  AST 15 - 41 U/L - - -  ALT 0 - 44 U/L - - -    CBC Latest Ref Rng & Units 06/08/2021 06/05/2021 06/05/2021  WBC 4.0 - 10.5  K/uL 6.3 7.0 6.9  Hemoglobin 12.0 - 15.0 g/dL 8.9(L) 8.2(L) 8.5(L)  Hematocrit 36.0 - 46.0 % 29.5(L) 27.4(L) 28.0(L)  Platelets 150 - 400 K/uL 380 413(H) 492(H)    Signature:  Roselie Awkward, MD [facility] Pager: [phone] Cell: [phone] After 7:00 pm call Elink  [phone]

## 2021-06-09 NOTE — Progress Notes (Addendum)
Nutrition Follow-up  DOCUMENTATION CODES:   Obesity unspecified  INTERVENTION:   Continue MVI with minerals. Continue regular diet, no supplements at this time.   NUTRITION DIAGNOSIS:   Increased nutrient needs related to acute illness (sepsis secondary to necrotizing pneumonia) as evidenced by estimated needs.  Ongoing   GOAL:   Patient will meet greater than or equal to 90% of their needs  Met with intake of meals and supplements  MONITOR:   PO intake, Supplement acceptance, Labs, Weight trends, Skin  REASON FOR ASSESSMENT:   Consult Assessment of nutrition requirement/status  ASSESSMENT:   30 year old female who presented to the ED on 12/08 with abscess to LLE x 4 days, R ankle edema, fever with cough. PMH of IVDA, anxiety disorder. Pt admitted with sepsis secondary to LLL necrotizing pneumonia, concern for septic arthritis to R ankle, and empyema.  Patient and visitor sleeping during RD visit. Patient awoke briefly for RD to complete NFPE. She reports good intake now and PTA. She does not want any PO supplements at this time.   Remains on a regular diet. Meal intakes: 100% of all meals  Labs reviewed.   Medications reviewed and include acidophilus, IV vancomycin, IV Rocephin.   NUTRITION - FOCUSED PHYSICAL EXAM:  Flowsheet Row Most Recent Value  Orbital Region No depletion  Upper Arm Region No depletion  Thoracic and Lumbar Region No depletion  Buccal Region No depletion  Temple Region No depletion  Clavicle Bone Region No depletion  Clavicle and Acromion Bone Region No depletion  Scapular Bone Region No depletion  Dorsal Hand No depletion  Patellar Region No depletion  Anterior Thigh Region No depletion  Posterior Calf Region No depletion  Edema (RD Assessment) Mild  Hair Reviewed  Eyes Reviewed  Mouth Reviewed  Skin Reviewed  Nails Reviewed       Diet Order:   Diet Order             Diet regular Room service appropriate? Yes; Fluid  consistency: Thin  Diet effective now                   EDUCATION NEEDS:   No education needs have been identified at this time  Skin:  Skin Assessment: Skin Integrity Issues: Skin Integrity Issues:: Other (Comment) Other: Non pressure wound to L tibia; R pretibial blister  Last BM:  12/14  Height:   Ht Readings from Last 1 Encounters:  06/02/21 $RemoveB'5\' 3"'IMbTeAzI$  (1.6 m)    Weight:   Wt Readings from Last 1 Encounters:  06/02/21 85.7 kg    BMI:  Body mass index is 33.47 kg/m.  Estimated Nutritional Needs:   Kcal:  1800-2000  Protein:  85-100 grams  Fluid:  1.8-2.0 L    Lucas Mallow, RD, LDN, CNSC Please refer to Amion for contact information.

## 2021-06-09 NOTE — Progress Notes (Signed)
[facility] for Infectious Disease  Date of Admission:  06/02/2021           Reason for visit: Follow up on empyema, leg infections  Current antibiotics: Vancomycin 12/8-present Ceftriaxone 12/14-present  Previous antibiotics: Cefepime 12/8-12/14    ASSESSMENT:    30 y.o. female admitted with:  Right lower leg infection: with subcutaneous soft tissue abscess spontaneously draining.  Right ankle aspiration cultures NGTD and she is clinically stable/improved. Left leg abscess: this has been spontaneously draining Left lung empyema and cavitary pneumonia: status post chest tube placement 12/8.  Repeat CT scan shows improvement with small loculated component remaining. IVDU  RECOMMENDATIONS:    Continue current antibiotics vancomycin and ceftriaxone Plan for augmentin and doxy at discharge.  Will need prolonged course due to empyema and cavitary pneumonia Await CCM recommendations on next steps re: chest tube Will follow  Principal Problem:   Sepsis ([place]) Active Problems:   IV drug abuse ([place])   Substance abuse ([place])   Empyema lung ([place])   Chest tube in place    MEDICATIONS:    Scheduled Meds:  acidophilus  2 capsule Oral Daily   enoxaparin (LOVENOX) injection  40 mg Subcutaneous Q24H   guaiFENesin  1,200 mg Oral BID   nicotine  21 mg Transdermal Daily   sodium chloride flush  10 mL Other Q8H   Continuous Infusions:  cefTRIAXone (ROCEPHIN)  IV 2 g (06/08/21 1435)   vancomycin 1,000 mg (06/09/21 0529)   PRN Meds:.acetaminophen **OR** acetaminophen, ALPRAZolam, cloNIDine, hydrOXYzine, ipratropium-albuterol, ketorolac, traMADol  SUBJECTIVE:   24 hour events:  No acute events noted  Patient reports more pain today and overall feels bad.  No fevers, chills.  No other complaints.   Review of Systems  All other systems reviewed and are negative.    OBJECTIVE:   Blood pressure 119/76, pulse 98, temperature 98.1 F (36.7 C), temperature source Oral,  resp. rate 20, height 5\' 3"  (1.6 m), weight 85.7 kg, last menstrual period 05/13/2021, SpO2 95 %. Body mass index is 33.47 kg/m.  Physical Exam Constitutional:      General: She is not in acute distress.    Appearance: Normal appearance.  HENT:     Head: Normocephalic and atraumatic.  Eyes:     Extraocular Movements: Extraocular movements intact.     Conjunctiva/sclera: Conjunctivae normal.  Pulmonary:     Effort: Pulmonary effort is normal. No respiratory distress.     Comments: Chest tube in place.  Abdominal:     General: There is no distension.     Palpations: Abdomen is soft.  Musculoskeletal:        General: Swelling and tenderness present.     Comments: Right ankle with continued improvement.  Skin:    General: Skin is warm and dry.  Neurological:     General: No focal deficit present.     Mental Status: She is alert and oriented to person, place, and time.  Psychiatric:        Mood and Affect: Mood normal.     Lab Results: Lab Results  Component Value Date   WBC 6.3 06/08/2021   HGB 8.9 (L) 06/08/2021   HCT 29.5 (L) 06/08/2021   MCV 78.9 (L) 06/08/2021   PLT 380 06/08/2021    Lab Results  Component Value Date   NA 135 06/09/2021   K 4.2 06/09/2021   CO2 26 06/09/2021   GLUCOSE 99 06/09/2021   BUN 9 06/09/2021   CREATININE  0.84 06/09/2021   CALCIUM 8.6 (L) 06/09/2021   GFRNONAA >60 06/09/2021   GFRAA >60 04/03/2019    Lab Results  Component Value Date   ALT QUANTITY NOT SUFFICIENT, UNABLE TO PERFORM TEST 06/03/2021   AST 27 06/03/2021   ALKPHOS 69 06/03/2021   BILITOT QUANTITY NOT SUFFICIENT, UNABLE TO PERFORM TEST 06/03/2021    No results found for: CRP  No results found for: ESRSEDRATE   I have reviewed the micro and lab results in Epic.  Imaging: No results found.   Imaging independently reviewed in Epic.    Vedia Coffer for Infectious Disease [facility] [phone] pager 06/09/2021, 10:12  AM  I spent greater than 35 minutes with the patient including greater than 50% of time in face to face counsel of the patient and in coordination of their care.

## 2021-06-10 ENCOUNTER — Other Ambulatory Visit (HOSPITAL_COMMUNITY): Payer: Self-pay

## 2021-06-10 ENCOUNTER — Encounter (HOSPITAL_COMMUNITY): Payer: Self-pay | Admitting: Pharmacist

## 2021-06-10 LAB — CBC WITH DIFFERENTIAL/PLATELET
Abs Immature Granulocytes: 0.01 10*3/uL (ref 0.00–0.07)
Basophils Absolute: 0 10*3/uL (ref 0.0–0.1)
Basophils Relative: 1 %
Eosinophils Absolute: 0.1 10*3/uL (ref 0.0–0.5)
Eosinophils Relative: 1 %
HCT: 34.3 % — ABNORMAL LOW (ref 36.0–46.0)
Hemoglobin: 10.4 g/dL — ABNORMAL LOW (ref 12.0–15.0)
Immature Granulocytes: 0 %
Lymphocytes Relative: 28 %
Lymphs Abs: 1.7 10*3/uL (ref 0.7–4.0)
MCH: 23.9 pg — ABNORMAL LOW (ref 26.0–34.0)
MCHC: 30.3 g/dL (ref 30.0–36.0)
MCV: 78.9 fL — ABNORMAL LOW (ref 80.0–100.0)
Monocytes Absolute: 0.4 10*3/uL (ref 0.1–1.0)
Monocytes Relative: 7 %
Neutro Abs: 3.8 10*3/uL (ref 1.7–7.7)
Neutrophils Relative %: 63 %
Platelets: 416 10*3/uL — ABNORMAL HIGH (ref 150–400)
RBC: 4.35 MIL/uL (ref 3.87–5.11)
RDW: 23.5 % — ABNORMAL HIGH (ref 11.5–15.5)
WBC: 6 10*3/uL (ref 4.0–10.5)
nRBC: 0 % (ref 0.0–0.2)

## 2021-06-10 LAB — BASIC METABOLIC PANEL
Anion gap: 8 (ref 5–15)
BUN: 10 mg/dL (ref 6–20)
CO2: 27 mmol/L (ref 22–32)
Calcium: 8.8 mg/dL — ABNORMAL LOW (ref 8.9–10.3)
Chloride: 100 mmol/L (ref 98–111)
Creatinine, Ser: 0.89 mg/dL (ref 0.44–1.00)
GFR, Estimated: >60 mL/min (ref >60)
Glucose, Bld: 98 mg/dL (ref 70–99)
Potassium: 4 mmol/L (ref 3.5–5.1)
Sodium: 135 mmol/L (ref 135–145)

## 2021-06-10 LAB — VANCOMYCIN, RANDOM: Vancomycin Rm: 20

## 2021-06-10 MED ORDER — KETOROLAC TROMETHAMINE 10 MG PO TABS
10.0000 mg | ORAL_TABLET | Freq: Two times a day (BID) | ORAL | 0 refills | Status: AC | PRN
Start: 2021-06-10 — End: 2021-06-15

## 2021-06-10 MED ORDER — DOXYCYCLINE HYCLATE 100 MG PO TABS
100.0000 mg | ORAL_TABLET | Freq: Two times a day (BID) | ORAL | 0 refills | Status: AC
  Filled 2021-06-10: qty 68, 34d supply, fill #0

## 2021-06-10 MED ORDER — TRAMADOL HCL 50 MG PO TABS: 100.0000 mg | ORAL_TABLET | Freq: Two times a day (BID) | ORAL | 0 refills | Status: AC | PRN

## 2021-06-10 MED ORDER — RISAQUAD PO CAPS
2.0000 | ORAL_CAPSULE | Freq: Every day | ORAL | 0 refills | Status: AC
  Filled 2021-06-10: qty 60, 30d supply, fill #0

## 2021-06-10 MED ORDER — AMOXICILLIN-POT CLAVULANATE 875-125 MG PO TABS
1.0000 | ORAL_TABLET | Freq: Two times a day (BID) | ORAL | Status: DC
  Administered 2021-06-10: 1 via ORAL
  Filled 2021-06-10: qty 1

## 2021-06-10 MED ORDER — DOXYCYCLINE HYCLATE 100 MG PO TABS
100.0000 mg | ORAL_TABLET | Freq: Two times a day (BID) | ORAL | Status: DC
  Administered 2021-06-10: 100 mg via ORAL
  Filled 2021-06-10: qty 1

## 2021-06-10 MED ORDER — NICOTINE 21 MG/24HR TD PT24
21.0000 mg | MEDICATED_PATCH | Freq: Every day | TRANSDERMAL | 0 refills | Status: AC
  Filled 2021-06-10: qty 28, 28d supply, fill #0

## 2021-06-10 MED ORDER — ADULT MULTIVITAMIN W/MINERALS CH: 1.0000 | ORAL_TABLET | Freq: Every day | ORAL | Status: AC

## 2021-06-10 MED ORDER — AMOXICILLIN-POT CLAVULANATE 875-125 MG PO TABS
1.0000 | ORAL_TABLET | Freq: Two times a day (BID) | ORAL | 0 refills | Status: AC
  Filled 2021-06-10: qty 68, 34d supply, fill #0

## 2021-06-10 NOTE — Evaluation (Signed)
Physical Therapy Evaluation Patient Details Name: Sharon [name] MRN: [id number] DOB: 1990/12/27 Today's Date: 06/10/2021  History of Present Illness  30 yo admitted 12/8 with bil LE edema and wounds with abscesses and cellulitis. Pt also with left lung empyema and necrotizing PNA s/p chest tube 12/8-12/15. PMhx: IVDU, anxiety  Clinical Impression  Pt pleasant with decreased ROM left ankle due to pain. Pt limiting weight bearing due to pain with education for transfers, gait and progressive ROM as well as stairs with RW. Pt has necessary DME at home and agreed to maintain RW use at home. Pt with decreased strength, function and gait who will benefit from acute therapy to maximize independence.   HR 130-169 with gait at rest 115       Recommendations for follow up therapy are one component of a multi-disciplinary discharge planning process, led by the attending physician.  Recommendations may be updated based on patient status, additional functional criteria and insurance authorization.  Follow Up Recommendations No PT follow up    Assistance Recommended at Discharge Intermittent Supervision/Assistance  Functional Status Assessment Patient has had a recent decline in their functional status and demonstrates the ability to make significant improvements in function in a reasonable and predictable amount of time.  Equipment Recommendations  None recommended by PT    Recommendations for Other Services       Precautions / Restrictions Precautions Precautions: Fall      Mobility  Bed Mobility Overal bed mobility: Modified Independent                  Transfers Overall transfer level: Modified independent                 General transfer comment: cues for hand placement    Ambulation/Gait Ambulation/Gait assistance: Supervision Gait Distance (Feet): 90 Feet Assistive device: Rolling walker (2 wheels) Gait Pattern/deviations: Step-to pattern   Gait velocity  interpretation: 1.31 - 2.62 ft/sec, indicative of limited community ambulator   General Gait Details: pt with cues for sequence with pt preferring NwB status but cued for progressive weight bearing on RLE and ankle ROM as pain allows. HR up to 169 with gait  Stairs Stairs: Yes Stairs assistance: Min assist Stair Management: Step to pattern;Sideways;Backwards;With walker;One rail Right Number of Stairs: 2 General stair comments: pt initially attempted stepping with right rail but struggled after completing 2 steps. Performed min with Rw backward 2 steps with increased ease and stability with pt and fiance educated and handout provided  Wheelchair Mobility    Modified Rankin (Stroke Patients Only)       Balance Overall balance assessment: Modified Independent                                           Pertinent Vitals/Pain Pain Assessment: 0-10 Pain Score: 6  Pain Location: right ankle 6/10 at rest, 8/10 with weight bearing Pain Descriptors / Indicators: Aching;Pounding;Constant Pain Intervention(s): Limited activity within patient's tolerance;Monitored during session;Repositioned    Home Living Family/patient expects to be discharged to:: Private residence Living Arrangements: Spouse/significant other Available Help at Discharge: Family;Available 24 hours/day Type of Home: House Home Access: Stairs to enter Entrance Stairs-Rails: Right;Left;Can reach both Entrance Stairs-Number of Steps: 6   Home Layout: One level Home Equipment: Control and instrumentation engineer (2 wheels);Cane - single point      Prior Function Prior Level of Function :  Independent/Modified Independent                     Hand Dominance        Extremity/Trunk Assessment   Upper Extremity Assessment Upper Extremity Assessment: Overall WFL for tasks assessed    Lower Extremity Assessment Lower Extremity Assessment: RLE deficits/detail RLE Deficits / Details:  decreased ankle ROM with pain    Cervical / Trunk Assessment Cervical / Trunk Assessment: Normal  Communication   Communication: No difficulties  Cognition Arousal/Alertness: Awake/alert Behavior During Therapy: WFL for tasks assessed/performed Overall Cognitive Status: Within Functional Limits for tasks assessed                                          General Comments      Exercises     Assessment/Plan    PT Assessment Patient needs continued PT services  PT Problem List Decreased mobility;Decreased range of motion;Decreased activity tolerance;Pain;Decreased knowledge of use of DME       PT Treatment Interventions Gait training;Stair training;Functional mobility training;Therapeutic activities;Patient/family education;Balance training;DME instruction;Therapeutic exercise    PT Goals (Current goals can be found in the Care Plan section)  Acute Rehab PT Goals Patient Stated Goal: return home PT Goal Formulation: With patient/family Time For Goal Achievement: 06/17/21 Potential to Achieve Goals: Good    Frequency Min 3X/week   Barriers to discharge        Co-evaluation               AM-PAC PT "6 Clicks" Mobility  Outcome Measure Help needed turning from your back to your side while in a flat bed without using bedrails?: None Help needed moving from lying on your back to sitting on the side of a flat bed without using bedrails?: None Help needed moving to and from a bed to a chair (including a wheelchair)?: None Help needed standing up from a chair using your arms (e.g., wheelchair or bedside chair)?: None Help needed to walk in hospital room?: A Little Help needed climbing 3-5 steps with a railing? : A Little 6 Click Score: 22    End of Session   Activity Tolerance: Patient tolerated treatment well Patient left: in chair;with call bell/phone within reach;with family/visitor present Nurse Communication: Mobility status PT Visit Diagnosis:  Other abnormalities of gait and mobility (R26.89);Pain Pain - Right/Left: Left Pain - part of body: Ankle and joints of foot    Time: 3154-0086 PT Time Calculation (min) (ACUTE ONLY): 32 min   Charges:   PT Evaluation $PT Eval Moderate Complexity: 1 Mod          Kaushal Vannice P, PT Acute Rehabilitation Services Pager: [phone] Office: [phone]   Avianna Moynahan B Godwin Tedesco 06/10/2021, 1:48 PM

## 2021-06-10 NOTE — TOC Transition Note (Signed)
Transition of Care (TOC) - CM/SW Discharge Note Donn Pierini RN, BSN Transitions of Care Unit 4E- RN Case Manager See Treatment Team for direct phone #    Patient Details  Name: Sharon DELUDE MRN: [id number] Date of Birth: [birth year]  Transition of Care [facility]) CM/SW Contact:  Darrold Span, RN Phone Number: 06/10/2021, 3:18 PM   Clinical Narrative:    Pt stable for transition home, f/u appointment has been made with [facility] for Jan. 17 at 1:00pm for primary care needs.   TOC pharmacy has filled meds for discharge and delivered to bedside.    Final next level of care: Home/Self Care Barriers to Discharge: No Barriers Identified   Patient Goals and CMS Choice    NA    Discharge Placement               home        Discharge Plan and Services   Discharge Planning Services: CM Consult, Indigent Health Clinic, Follow-up appt scheduled Post Acute Care Choice: NA          DME Arranged: N/A DME Agency: NA       HH Arranged: NA HH Agency: NA        Social Determinants of Health (SDOH) Interventions     Readmission Risk Interventions Readmission Risk Prevention Plan 06/10/2021  Post Dischage Appt Complete  Medication Screening Complete  Transportation Screening Complete  Some recent data might be hidden

## 2021-06-10 NOTE — Progress Notes (Signed)
[facility]  Date of Admission:  06/02/2021           Reason for visit: Follow up on empyema, leg infections  Current antibiotics: Vancomycin 12/8--present Ceftriaxone 12/14--present   ASSESSMENT:    30 y.o. female admitted with:  Right lower leg infection: With subcutaneous soft tissue abscess that is spontaneously draining.  Right ankle aspiration cultures finalized as no growth and less suspicion for septic arthritis. Left leg abscess: This has also been spontaneously draining. Left lung empyema and cavitary pneumonia: Status post chest tube placement 12/8 and subsequent removal 12/15.  Repeat CT chest yesterday showed small residual fluid, however, not enough to affect breathing and she is on room air.  Pulmonology recommending continued antibiotics without any further intervention. Injection drug use  RECOMMENDATIONS:    Will change antibiotics to doxycycline and Augmentin for discharge Will plan for 6 weeks duration with interval imaging Will arrange outpatient follow-up Will sign off.  Please call with questions.   Principal Problem:   Sepsis (HCC) Active Problems:   IV drug abuse (HCC)   Substance abuse (HCC)   Empyema lung (HCC)   Chest tube in place   Abscess    MEDICATIONS:    Scheduled Meds:  acidophilus  2 capsule Oral Daily   enoxaparin (LOVENOX) injection  40 mg Subcutaneous Q24H   guaiFENesin  1,200 mg Oral BID   multivitamin with minerals  1 tablet Oral Daily   nicotine  21 mg Transdermal Daily   vancomycin variable dose per unstable renal function (pharmacist dosing)   Does not apply See admin instructions   Continuous Infusions:  cefTRIAXone (ROCEPHIN)  IV 2 g (06/09/21 1614)   PRN Meds:.acetaminophen **OR** acetaminophen, ALPRAZolam, cloNIDine, hydrOXYzine, ipratropium-albuterol, traMADol  SUBJECTIVE:   24 hour events:  No acute events overnight Repeat chest CT done Chest tube removed Afebrile On room  air All cultures negative   Patient with no new complaints this morning.  She is resting comfortably on room air.  She reports her chest tube was removed yesterday.  She has not had any worsening since that time.  She is anxious to discharge.  Review of Systems  All other systems reviewed and are negative.    OBJECTIVE:   Blood pressure 116/68, pulse 86, temperature 98.2 F (36.8 C), temperature source Oral, resp. rate 16, height 5\' 3"  (1.6 m), weight 85.7 kg, last menstrual period 05/13/2021, SpO2 98 %. Body mass index is 33.47 kg/m.  Physical Exam Constitutional:      General: She is not in acute distress.    Appearance: Normal appearance.  HENT:     Head: Normocephalic and atraumatic.  Eyes:     Extraocular Movements: Extraocular movements intact.     Conjunctiva/sclera: Conjunctivae normal.  Cardiovascular:     Rate and Rhythm: Normal rate and regular rhythm.  Pulmonary:     Effort: Pulmonary effort is normal. No respiratory distress.     Comments: Left-sided chest tube removed. Left lower lobe breath sounds are coarse. Abdominal:     General: There is no distension.     Palpations: Abdomen is soft.     Tenderness: There is no abdominal tenderness.  Musculoskeletal:     Comments: Right ankle swelling and erythema improved.  Range of motion improved. Bandages covering her draining abscess sites.  Skin:    General: Skin is warm and dry.     Coloration: Skin is not jaundiced.  Neurological:  General: No focal deficit present.     Mental Status: She is alert and oriented to person, place, and time.  Psychiatric:        Mood and Affect: Mood normal.        Behavior: Behavior normal.     Lab Results: Lab Results  Component Value Date   WBC 6.3 06/08/2021   HGB 8.9 (L) 06/08/2021   HCT 29.5 (L) 06/08/2021   MCV 78.9 (L) 06/08/2021   PLT 380 06/08/2021    Lab Results  Component Value Date   NA 135 06/10/2021   K 4.0 06/10/2021   CO2 27 06/10/2021    GLUCOSE 98 06/10/2021   BUN 10 06/10/2021   CREATININE 0.89 06/10/2021   CALCIUM 8.8 (L) 06/10/2021   GFRNONAA >60 06/10/2021   GFRAA >60 04/03/2019    Lab Results  Component Value Date   ALT QUANTITY NOT SUFFICIENT, UNABLE TO PERFORM TEST 06/03/2021   AST 27 06/03/2021   ALKPHOS 69 06/03/2021   BILITOT QUANTITY NOT SUFFICIENT, UNABLE TO PERFORM TEST 06/03/2021    No results found for: CRP  No results found for: ESRSEDRATE   I have reviewed the micro and lab results in Epic.  Imaging: CT CHEST WO CONTRAST  Result Date: 06/09/2021 CLINICAL DATA:  Empyema with previous chest tube placement EXAM: CT CHEST WITHOUT CONTRAST TECHNIQUE: Multidetector CT imaging of the chest was performed following the standard protocol without IV contrast. COMPARISON:  Previous studies including the examination of 06/07/2021 FINDINGS: Cardiovascular: Unremarkable. Mediastinum/Nodes: There are enlarged lymph nodes in the mediastinum largest measuring 2.1 by 1 cm. This finding has not changed significantly. Lungs/Pleura: Right lung is essentially clear. There is an infiltrate with cavitation in the lingular segment of left upper lobe which has not changed significantly. Cavitation seen in the lateral aspect of the infiltrate measures proximally 4.3 cm in maximum diameter and is inseparable from the pleural surface. This may either suggest extension of empyema or lung abscess. There is interval decrease in small patchy infiltrates in the left lower lobe. There are no new focal pulmonary infiltrates. There is 6.3 x 4.4 cm loculated fluid collection in the posterior left mid lung fields which appears smaller in size. Left chest tube is noted with its tip in the posterior aspect. There is small residual effusion in the medial aspect of left posterior costophrenic angle. There is a linear loculated fluid collection in the right posterior costophrenic angle measuring 10 x 2.1 cm with possible decrease. There is no  pneumothorax. Upper Abdomen: Spleen is enlarged measuring 14.2 cm in AP diameter. There are low-density foci in the gallbladder lumen suggesting gallstones. Musculoskeletal: Unremarkable. Inflammatory stranding seen in the soft tissues in the right axilla in the previous examination done on 06/02/2021 has resolved. IMPRESSION: There are patchy infiltrates in the left lung with interval improvement. There is interval decrease in size of loculated effusion in the posterior left mid lung fields. There is small residual effusion in the left posterior costophrenic angle. There is linear pleural effusion in the posterior right lower lung fields with possible slight decrease in size. There are no new infiltrates or new significant pleural effusion. Enlarged lymph nodes in the mediastinum have not changed significantly. Enlarged spleen. Gallbladder stones. Electronically Signed   By: Elmer Picker M.D.   On: 06/09/2021 16:35     Imaging independently reviewed in Epic.    [facility] [phone] pager 06/10/2021, 9:55 AM  I spent greater than 35 minutes with the patient including greater than 50% of time in face to face counsel of the patient and in coordination of their care.

## 2021-06-10 NOTE — Discharge Summary (Signed)
Physician Discharge Summary  Sharon [name] ZOX:[id number] DOB: [birth year] DOA: 06/02/2021  PCP: Patient, No Pcp Per (Inactive)  Admit date: 06/02/2021 Discharge date: 06/10/2021  Admitted From: Home Disposition: Home.   Recommendations for Outpatient Follow-up:  Follow up with PCP in 1-2 weeks Please obtain BMP/CBC in one week Please follow up with methadone clinic on Monday  Discharge Condition:stable.  CODE STATUS:Full code.  Diet recommendation: Heart Healthy  Brief/Interim Summary: Sharon [name] is a 30 y.o. female with a history of IVDU, tobacco use, anxiety who presented to the ED on 12/8 with infected wound of the left leg, painful swelling of the right ankle, and fever with cough. She was tachycardic with borderline hypotension, given IVF. WBC 20k. CTA chest revealed cavitary pneumonia with associated loculated left pleural effusion. PCCM was consulted, placed pigtail catheter and is proceeding with fibrinolytic. Right ankle was tapped with no fluid return in ED. Swelling has improved with IV antibiotics alone, so orthopedics not currently recommending surgical management. Blood culture and pleural fluid culture are pending, echocardiogram ordered. ID guiding antibiotics, changed now to vancomycin and cefepime. Additional abscess on MR of right leg which has ruptured prior to I&D. Right ankle arthrocentesis performed 12/12. Cardiothoracic surgery is consulted for empyema, consideration of VATS.   Discharge Diagnoses:  Principal Problem:   Sepsis (HCC) Active Problems:   IV drug abuse (HCC)   Substance abuse (HCC)   Empyema lung (HCC)   Chest tube in place   Abscess  Sepsis due to multiple infections , concern for bacteremia in IVDU:  - Resume vancomycin and cefepime.  Echocardiogram - No vegetations.      Cavitary lingula pneumonia and empyema:  PCCM on board, appreciate recommendations.  CT surgery on board and s/p chest tube placement on 06/02/2021.  Left drainage  has stopped. Plan to remove the current pigtail catheter. CT chest ordered to evaluate for residual fluid in the left upper lobe. - ID on board , discharged on Augmentin and doxycycline to complete the course.  - Pleural fluid cultures are negative so far.     Right Lower extremity abscesses and cellulitis:  Right ankle synovial culture NGTD.  Lower extremity duplex negative for DVT.      Left calf Abscess:  - spontaneous draining.  -  bandaged.     IVDU, narcotic use,  No signs of withdrawal.      Hypokalemia:  Replaced.    Microcytic anemia:  S/p 1 unit of transfusion.  Hemoglobin stable post transfusion.  Hemoglobin around 8.9.      Hypoalbuminemia:  Dietary consulted.      Tobacco abuse:  On nicotine patch.    Obesity:  Body mass index is 33.47 kg/m. Increased risk of morbidity and mortality.       Discharge Instructions  Discharge Instructions     Diet - low sodium heart healthy   Complete by: As directed    Discharge wound care:   Complete by: As directed    Cleanse the wounds on bilateral LE with NS. Place a small piece of Xeroform gauze over the wounds, cover with 4 x 4s and wrap with Kerlix. Change daily or PRN drainage      Allergies as of 06/10/2021   No Known Allergies      Medication List     STOP taking these medications    ibuprofen 200 MG tablet Commonly known as: ADVIL   ibuprofen 600 MG tablet Commonly known as: ADVIL   senna-docusate 8.6-50 MG  tablet Commonly known as: Senokot-S       TAKE these medications    acidophilus Caps capsule Take 2 capsules by mouth daily. Start taking on: June 11, 2021   ALPRAZolam 1 MG tablet Commonly known as: XANAX Take 1 mg by mouth at bedtime as needed for anxiety.   amoxicillin-clavulanate 875-125 MG tablet Commonly known as: AUGMENTIN Take 1 tablet by mouth every 12 (twelve) hours.   doxycycline 100 MG tablet Commonly known as: VIBRA-TABS Take 1 tablet (100 mg total)  by mouth every 12 (twelve) hours.   ketorolac 10 MG tablet Commonly known as: TORADOL Take 1 tablet (10 mg total) by mouth every 12 (twelve) hours as needed for up to 5 days.   multivitamin with minerals Tabs tablet Take 1 tablet by mouth daily. Start taking on: June 11, 2021   nicotine 21 mg/24hr patch Commonly known as: NICODERM CQ - dosed in mg/24 hours Place 1 patch (21 mg total) onto the skin daily. Start taking on: June 11, 2021   traMADol 50 MG tablet Commonly known as: ULTRAM Take 2 tablets (100 mg total) by mouth every 12 (twelve) hours as needed for up to 5 days for moderate pain.               Discharge Care Instructions  (From admission, onward)           Start     Ordered   06/10/21 0000  Discharge wound care:       Comments: Cleanse the wounds on bilateral LE with NS. Place a small piece of Xeroform gauze over the wounds, cover with 4 x 4s and wrap with Kerlix. Change daily or PRN drainage   06/10/21 1214            Follow-up Information     Kathlynn Grate, DO Follow up on 07/08/2021.   Specialties: Infectious Diseases, Internal Medicine Why: at 9:30am Contact information: [address] [employer] [address] [place] [address] [phone]         Rema Fendt, NP Follow up.   Specialty: Nurse Practitioner Why: TIME : 1:00 PM DATE: JANUARY 17 , 2023 DOCTOR: AMY STEPHEN Contact information: [address] [place] [address] [phone]                No Known Allergies  Consultations: PCCM ID.    Procedures/Studies: DG Ankle Complete Right  Result Date: 06/02/2021 CLINICAL DATA:  30 year old female with sepsis. Bilateral leg swelling. EXAM: RIGHT ANKLE - COMPLETE 3+ VIEW COMPARISON:  No priors. FINDINGS: There is no evidence of fracture, dislocation, or joint effusion. There is no evidence of arthropathy or other focal bone abnormality. Soft tissues are diffusely swollen. IMPRESSION: 1. Diffuse  swelling of the soft tissues around the right ankle joint. No acute osseous abnormality. Electronically Signed   By: Trudie Reed M.D.   On: 06/02/2021 05:12   CT CHEST WO CONTRAST  Result Date: 06/09/2021 CLINICAL DATA:  Empyema with previous chest tube placement EXAM: CT CHEST WITHOUT CONTRAST TECHNIQUE: Multidetector CT imaging of the chest was performed following the standard protocol without IV contrast. COMPARISON:  Previous studies including the examination of 06/07/2021 FINDINGS: Cardiovascular: Unremarkable. Mediastinum/Nodes: There are enlarged lymph nodes in the mediastinum largest measuring 2.1 by 1 cm. This finding has not changed significantly. Lungs/Pleura: Right lung is essentially clear. There is an infiltrate with cavitation in the lingular segment of left upper lobe which has not changed significantly. Cavitation seen in  the lateral aspect of the infiltrate measures proximally 4.3 cm in maximum diameter and is inseparable from the pleural surface. This may either suggest extension of empyema or lung abscess. There is interval decrease in small patchy infiltrates in the left lower lobe. There are no new focal pulmonary infiltrates. There is 6.3 x 4.4 cm loculated fluid collection in the posterior left mid lung fields which appears smaller in size. Left chest tube is noted with its tip in the posterior aspect. There is small residual effusion in the medial aspect of left posterior costophrenic angle. There is a linear loculated fluid collection in the right posterior costophrenic angle measuring 10 x 2.1 cm with possible decrease. There is no pneumothorax. Upper Abdomen: Spleen is enlarged measuring 14.2 cm in AP diameter. There are low-density foci in the gallbladder lumen suggesting gallstones. Musculoskeletal: Unremarkable. Inflammatory stranding seen in the soft tissues in the right axilla in the previous examination done on 06/02/2021 has resolved. IMPRESSION: There are patchy  infiltrates in the left lung with interval improvement. There is interval decrease in size of loculated effusion in the posterior left mid lung fields. There is small residual effusion in the left posterior costophrenic angle. There is linear pleural effusion in the posterior right lower lung fields with possible slight decrease in size. There are no new infiltrates or new significant pleural effusion. Enlarged lymph nodes in the mediastinum have not changed significantly. Enlarged spleen. Gallbladder stones. Electronically Signed   By: Ernie Avena M.D.   On: 06/09/2021 16:35   CT CHEST WO CONTRAST  Result Date: 06/07/2021 CLINICAL DATA:  30 year old female with history of empyema. EXAM: CT CHEST WITHOUT CONTRAST TECHNIQUE: Multidetector CT imaging of the chest was performed following the standard protocol without IV contrast. COMPARISON:  Chest CT 06/02/2021. FINDINGS: Cardiovascular: Heart size is normal. There is no significant pericardial fluid, thickening or pericardial calcification. No atherosclerotic calcifications are noted in the thoracic aorta or the coronary arteries. Mediastinum/Nodes: No pathologically enlarged mediastinal or hilar lymph nodes. Several prominent borderline enlarged mediastinal lymph nodes are again noted, likely reactive. Esophagus is unremarkable in appearance. No axillary lymphadenopathy. Lungs/Pleura: New left-sided pigtail drainage catheter with tip reformed in the posterior aspect of the left hemithorax. Previously noted large left-sided pleural fluid collection has substantially decreased in size, although there continues to be a loculated component in the posterior aspect of the upper left hemithorax just cephalad to the superior segment of the left lower lobe slightly above the tip of the catheter. Trace volume of right pleural fluid, new compared to the prior examination. Patchy areas of atelectasis in the left lower lobe, overall with improved left lower lobe  aeration compared to the prior examination. More well-defined area of cavitation in the inferior segment of the lingula than seen on the prior examination, presumably secondary to necrotizing pneumonia. Right lung is clear. Upper Abdomen: Unremarkable. Musculoskeletal: There are no aggressive appearing lytic or blastic lesions noted in the visualized portions of the skeleton. IMPRESSION: 1. Interval placement of pigtail drainage catheter in the left hemithorax with effective drainage of much of the previously noted left-sided empyema, although there continues to be a small loculated component, as above. 2. Interval development of a small right pleural effusion. 3. Further cavitation of necrotizing pneumonia in the inferior segment of the lingula. Electronically Signed   By: Trudie Reed M.D.   On: 06/07/2021 11:06   CT Angio Chest PE W/Cm &/Or Wo Cm  Addendum Date: 06/02/2021   ADDENDUM REPORT: 06/02/2021 09:08  ADDENDUM: As mentioned in the body of the report there is extensive soft tissue stranding in the subcutaneous fat of the right axillary region where there also appears to be a small amount of gas in the soft tissues. Clinical correlation for signs and symptoms of acute soft tissue infection in the right upper extremity is recommended. Electronically Signed   By: Trudie Reed M.D.   On: 06/02/2021 09:08   Result Date: 06/02/2021 CLINICAL DATA:  30 year old female with history of IV drug abuse. Possible septic emboli. Possible infectious endocarditis. EXAM: CT ANGIOGRAPHY CHEST WITH CONTRAST TECHNIQUE: Multidetector CT imaging of the chest was performed using the standard protocol during bolus administration of intravenous contrast. Multiplanar CT image reconstructions and MIPs were obtained to evaluate the vascular anatomy. CONTRAST:  OMNIPAQUE IOHEXOL 350 MG/ML SOLN COMPARISON:  No priors. FINDINGS: Cardiovascular: There are no filling defects within the pulmonary arterial tree to suggest  pulmonary embolism. Heart size is normal. There is no significant pericardial fluid, thickening or pericardial calcification. No atherosclerotic calcifications are noted in the thoracic aorta or the coronary arteries. Mediastinum/Nodes: Prominent but nonenlarged mediastinal and left hilar lymph nodes are nonspecific, but likely reactive. Esophagus is unremarkable in appearance. No axillary lymphadenopathy. However, there is extensive soft tissue stranding and what appears to be some fluid in the right axillary region. A small amount of gas is also noted in the soft tissues of the proximal right upper extremity, incompletely imaged. Lungs/Pleura: Large complex left pleural effusion with extensive pleural enhancement which appears partially loculated. This is associated with extensive areas of passive atelectasis in the left lung, including near complete atelectasis of the left lower lobe. Some cavitary areas are noted in the inferior segment of the lingula. Right lung is clear. No right pleural effusion. No definite suspicious appearing pulmonary nodules or masses are noted. Upper Abdomen: Spleen is incompletely imaged, but appears likely enlarged measuring approximally 13.6 x 5.6 cm on axial images. Musculoskeletal: There are no aggressive appearing lytic or blastic lesions noted in the visualized portions of the skeleton. Review of the MIP images confirms the above findings. IMPRESSION: 1. No evidence of pulmonary embolism. 2. Area of cavitation in the inferior segment of the lingula, likely secondary to necrotizing pneumonia. This is associated with a large left-sided empyema, and extensive passive atelectasis in the left lung, most notably in the left lower lobe. 3. Probable splenomegaly. Electronically Signed: By: Trudie Reed M.D. On: 06/02/2021 06:54   MR HUMERUS RIGHT W WO CONTRAST  Result Date: 06/07/2021 CLINICAL DATA:  Right axillary soft tissue stranding and fluid seen on chest CT. Patient denies  right upper arm complaints. History of IV drug abuse with empyema and necrotizing pneumonia. EXAM: MRI OF THE RIGHT HUMERUS WITHOUT AND WITH CONTRAST TECHNIQUE: Multiplanar, multisequence MR imaging of the right upper arm was performed before and after the administration of intravenous contrast. CONTRAST:  8.53mL GADAVIST GADOBUTROL 1 MMOL/ML IV SOLN COMPARISON:  CTA chest dated June 02, 2021. FINDINGS: Bones/Joint/Cartilage No marrow signal abnormality. No fracture or dislocation. Joint spaces are preserved. No joint effusion. Muscles and Tendons Intact.  No muscle edema or atrophy. Soft tissue No fluid collection or hematoma.  No soft tissue mass. Small right pleural effusion. IMPRESSION: 1. No evidence of soft tissue infection or septic arthritis. Stranding/fluid in the right axilla seen on recent CT has resolved. 2. Small right pleural effusion. Electronically Signed   By: Obie Dredge M.D.   On: 06/07/2021 10:50   MR TIBIA FIBULA RIGHT  W WO CONTRAST  Result Date: 06/04/2021 CLINICAL DATA:  Soft tissue infection suspected, lower leg, xray done EXAM: MRI OF LOWER RIGHT EXTREMITY WITHOUT AND WITH CONTRAST TECHNIQUE: Multiplanar, multisequence MR imaging of the right tibia and fibula was performed both before and after administration of intravenous contrast. CONTRAST:  8mL GADAVIST GADOBUTROL 1 MMOL/ML IV SOLN COMPARISON:  Ankle x-ray 06/02/2021 FINDINGS: Bones/Joint/Cartilage No acute fracture. No malalignment. No bone marrow edema. No cortical destruction. No marrow replacing bone lesion. Trace tibiotalar joint effusion. Ligaments Grossly intact. Muscles and Tendons Small volume tenosynovial fluid associated with the posteromedial ankle tendons. Tendinous structures appear otherwise within normal limits. The mild diffuse intramuscular edema involving the anterior and deep posterior compartments of the lower leg with associated perifascial edema. Edema is most pronounced within the tibialis posterior and  flexor hallucis longus muscles. Edema tracks along the tibiofibular syndesmosis. No organized intramuscular fluid collection. Soft tissues Rim enhancing fluid collection within the medial subcutaneous soft tissues at the level of the tibial plateau. Collection measures 3.6 x 2.0 x 2.9 cm (series 7, image 11). Subcutaneous edema extends along the length of the lower leg, most pronounced anterolaterally with fluid overlying the superficial investing fascia of the anterior compartment. IMPRESSION: 1. Rim-enhancing abscess within the medial subcutaneous soft tissues at the level of the tibial plateau measuring 3.6 x 2.0 x 2.9 cm. 2. Mild diffuse intramuscular edema involving the anterior and deep posterior compartments of the lower leg with associated perifascial edema, compatible with myofasciitis. No evidence of myonecrosis or pyomyositis. 3. Mild tenosynovitis associated with the posteromedial ankle tendons, which may be reactive or infectious. 4. No evidence of osteomyelitis. Electronically Signed   By: Duanne Guess D.O.   On: 06/04/2021 12:02   DG Chest Port 1 View  Result Date: 06/06/2021 CLINICAL DATA:  30 year old female status post left thoracostomy tube placement. EXAM: PORTABLE CHEST - 1 VIEW COMPARISON:  06/05/2021, 06/04/2021, 06/02/2021 FINDINGS: Persistent obscuration of the left inferior mediastinal silhouette. Unchanged heart size. Unchanged position of indwelling left basilar pigtail thoracostomy tube. Similar to slightly decreased prominence of rounded opacity centered about the aortopulmonic window. The right lung is clear. No evidence of pneumothorax. No cardiomegaly. No acute osseous abnormality. IMPRESSION: Similar appearing small left pleural effusion and left basilar atelectasis with indwelling left pigtail thoracostomy tube. Similar appearance of loculated/fissural upper lobe pleural effusion. Electronically Signed   By: Marliss Coots M.D.   On: 06/06/2021 08:00   DG Chest Port 1  View  Result Date: 06/05/2021 CLINICAL DATA:  30 year old female with shortness of breath, chest pain EXAM: PORTABLE CHEST 1 VIEW COMPARISON:  Prior chest x-ray 06/04/2021 FINDINGS: Left-sided pigtail thoracostomy tube in unchanged position. Persistent loculated fluid within the fissure and the lateral periphery of the lung. The right lung remains clear. Cardiac and mediastinal contours are unchanged. No pneumothorax. No acute osseous abnormality. IMPRESSION: Stable position of left-sided pigtail thoracostomy tube with residual loculated effusion. Some fluid is trapped within the fissure. Right lung remains clear.  No new or acute abnormality. Electronically Signed   By: Malachy Moan M.D.   On: 06/05/2021 08:07   DG Chest Port 1 View  Result Date: 06/04/2021 CLINICAL DATA:  Shortness of breath, chest pain. EXAM: PORTABLE CHEST 1 VIEW COMPARISON:  June 02, 2021. FINDINGS: Stable cardiomediastinal silhouette. Lung is clear. Stable position of left-sided chest tube. Mild left basilar atelectasis is noted with small left pleural effusion. Left upper lobe opacity is noted concerning for atelectasis or infiltrate. Bony thorax is unremarkable.  IMPRESSION: Stable position of left-sided chest tube without pneumothorax. Left basilar atelectasis is noted with small left pleural effusion. Stable left upper lobe opacity as described above. Electronically Signed   By: Lupita Raider M.D.   On: 06/04/2021 09:39   DG Chest Port 1 View  Result Date: 06/02/2021 CLINICAL DATA:  Status post chest tube placement. EXAM: PORTABLE CHEST 1 VIEW COMPARISON:  Same day chest radiograph at 0415 hours and same day chest CT. FINDINGS: Interval placement of a left basilar pigtail thoracostomy tube. Decreased size of the left-sided empyema with increased expansion of the left lung. Left basilar airspace consolidation. The heart size and mediastinal contours are unchanged. The visualized skeletal structures are unchanged.  IMPRESSION: Interval placement of left basilar pigtail thoracostomy tube with decreased size of the left-sided empyema. Electronically Signed   By: Maudry Mayhew M.D.   On: 06/02/2021 15:54   DG Chest Port 1 View  Result Date: 06/02/2021 CLINICAL DATA:  30 year old female with sepsis. Bilateral leg swelling. History of IV drug abuse. EXAM: PORTABLE CHEST 1 VIEW COMPARISON:  Chest x-ray 03/29/2018. FINDINGS: Opacity throughout the left hemithorax, favored to reflect a combination of atelectasis and/or consolidation in the left lung base, likely with large left pleural effusion, however, there are some irregular lucencies within the left mid to lower hemithorax. Right lung appears clear. No right pleural effusion. No pneumothorax. No evidence of pulmonary edema. Heart size is normal. Upper mediastinal contours are within normal limits. IMPRESSION: 1. Grossly abnormal chest x-ray with unusual features. Findings suggest a combination of airspace consolidation, atelectasis and complex pleural fluid collection in the left hemithorax. Correlation with forthcoming chest CTA is recommended to better characterize these findings. Electronically Signed   By: Trudie Reed M.D.   On: 06/02/2021 05:11   ECHOCARDIOGRAM COMPLETE  Result Date: 06/03/2021    ECHOCARDIOGRAM REPORT   Patient Name:   Sharon [name] Shankles Date of Exam: 06/03/2021 Medical Rec #:  [id number]        Height:       63.0 in Accession #:    [id number]       Weight:       188.9 lb Date of Birth:  [birth year]         BSA:          1.887 m Patient Age:    30 years         BP:           101/60 mmHg Patient Gender: F                HR:           92 bpm. Exam Location:  Inpatient Procedure: 2D Echo, Cardiac Doppler and Color Doppler Indications:    Endocarditis  History:        Patient has no prior history of Echocardiogram examinations.  Sonographer:    Cleatis Polka Referring Phys: [id number] PING T ZHANG IMPRESSIONS  1. Left ventricular ejection fraction, by  estimation, is 60 to 65%. The left ventricle has normal function. The left ventricle has no regional wall motion abnormalities. Left ventricular diastolic parameters were normal.  2. Right ventricular systolic function is normal. The right ventricular size is normal. Tricuspid regurgitation signal is inadequate for assessing PA pressure.  3. Mild thickening of the mitral valve. No evidence of mitral valve regurgitation.  4. The aortic valve is grossly normal. Aortic valve regurgitation is not visualized.  5. The inferior vena cava is dilated in size  with >50% respiratory variability, suggesting right atrial pressure of 8 mmHg. Comparison(s): No prior Echocardiogram. Conclusion(s)/Recommendation(s): Otherwise normal echocardiogram, with minor abnormalities described in the report. FINDINGS  Left Ventricle: Left ventricular ejection fraction, by estimation, is 60 to 65%. The left ventricle has normal function. The left ventricle has no regional wall motion abnormalities. The left ventricular internal cavity size was normal in size. There is  no left ventricular hypertrophy. Left ventricular diastolic parameters were normal. Right Ventricle: The right ventricular size is normal. No increase in right ventricular wall thickness. Right ventricular systolic function is normal. Tricuspid regurgitation signal is inadequate for assessing PA pressure. Left Atrium: Left atrial size was normal in size. Right Atrium: Right atrial size was normal in size. Pericardium: There is no evidence of pericardial effusion. Mitral Valve: Mild thickening of the mitral valve. No evidence of mitral valve regurgitation. Tricuspid Valve: The tricuspid valve is normal in structure. Tricuspid valve regurgitation is not demonstrated. Aortic Valve: The aortic valve is grossly normal. Aortic valve regurgitation is not visualized. Aortic valve peak gradient measures 5.2 mmHg. Pulmonic Valve: The pulmonic valve was grossly normal. Pulmonic valve  regurgitation is not visualized. Aorta: The aortic root and ascending aorta are structurally normal, with no evidence of dilitation. Venous: The inferior vena cava is dilated in size with greater than 50% respiratory variability, suggesting right atrial pressure of 8 mmHg. IAS/Shunts: No atrial level shunt detected by color flow Doppler.  LEFT VENTRICLE PLAX 2D LVIDd:         4.70 cm     Diastology LVIDs:         3.00 cm     LV e' medial:    9.68 cm/s LV PW:         0.90 cm     LV E/e' medial:  7.8 LV IVS:        1.00 cm     LV e' lateral:   9.46 cm/s LVOT diam:     2.00 cm     LV E/e' lateral: 8.0 LV SV:         50 LV SV Index:   26 LVOT Area:     3.14 cm  LV Volumes (MOD) LV vol d, MOD A2C: 65.6 ml LV vol d, MOD A4C: 84.0 ml LV vol s, MOD A2C: 27.4 ml LV vol s, MOD A4C: 35.9 ml LV SV MOD A2C:     38.2 ml LV SV MOD A4C:     84.0 ml LV SV MOD BP:      44.8 ml RIGHT VENTRICLE             IVC RV Basal diam:  3.60 cm     IVC diam: 2.20 cm RV Mid diam:    2.40 cm RV S prime:     10.10 cm/s TAPSE (M-mode): 2.0 cm LEFT ATRIUM             Index        RIGHT ATRIUM           Index LA diam:        3.80 cm 2.01 cm/m   RA Area:     12.40 cm LA Vol (A2C):   29.0 ml 15.36 ml/m  RA Volume:   31.10 ml  16.48 ml/m LA Vol (A4C):   38.5 ml 20.40 ml/m LA Biplane Vol: 36.0 ml 19.07 ml/m  AORTIC VALVE AV Area (Vmax): 2.32 cm AV Vmax:        114.00 cm/s AV  Peak Grad:   5.2 mmHg LVOT Vmax:      84.20 cm/s LVOT Vmean:     60.100 cm/s LVOT VTI:       0.158 m  AORTA Ao Root diam: 2.70 cm Ao Asc diam:  2.40 cm MITRAL VALVE MV Area (PHT): 6.12 cm    SHUNTS MV Decel Time: 124 msec    Systemic VTI:  0.16 m MV E velocity: 75.40 cm/s  Systemic Diam: 2.00 cm MV A velocity: 66.80 cm/s MV E/A ratio:  1.13 Photographer signed by Carolan Clines Signature Date/Time: 06/03/2021/3:35:49 PM    Final    VAS [place] LOWER EXTREMITY VENOUS (DVT)  Result Date: 06/05/2021  Lower Venous DVT Study Patient Name:  Sharon [name]  Date of Exam:    06/03/2021 Medical Rec #: [id number]         Accession #:    [id number] Date of Birth: 08/03/1990          Patient Gender: F Patient Age:   30 years Exam Location:  [facility] Procedure:      VAS [place] LOWER EXTREMITY VENOUS (DVT) Referring Phys: Mikey College --------------------------------------------------------------------------------  Indications: RT ankle swelling, IV drug user.  Comparison Study: No prior studies. Performing Technologist: Jean Rosenthal RDMS, RVT  Examination Guidelines: A complete evaluation includes B-mode imaging, spectral Doppler, color Doppler, and power Doppler as needed of all accessible portions of each vessel. Bilateral testing is considered an integral part of a complete examination. Limited examinations for reoccurring indications may be performed as noted. The reflux portion of the exam is performed with the patient in reverse Trendelenburg.  +---------+---------------+---------+-----------+----------+--------------+  RIGHT     Compressibility Phasicity Spontaneity Properties Thrombus Aging  +---------+---------------+---------+-----------+----------+--------------+  CFV       Full            Yes       Yes                                    +---------+---------------+---------+-----------+----------+--------------+  SFJ       Full                                                             +---------+---------------+---------+-----------+----------+--------------+  FV Prox   Full                                                             +---------+---------------+---------+-----------+----------+--------------+  FV Mid    Full                                                             +---------+---------------+---------+-----------+----------+--------------+  FV Distal Full                                                             +---------+---------------+---------+-----------+----------+--------------+  PFV       Full                                                              +---------+---------------+---------+-----------+----------+--------------+  POP       Full            Yes       Yes                                    +---------+---------------+---------+-----------+----------+--------------+  PTV       Full                                                             +---------+---------------+---------+-----------+----------+--------------+  PERO      Full                                                             +---------+---------------+---------+-----------+----------+--------------+  Gastroc   Full                                                             +---------+---------------+---------+-----------+----------+--------------+   +----+---------------+---------+-----------+----------+--------------+  LEFT Compressibility Phasicity Spontaneity Properties Thrombus Aging  +----+---------------+---------+-----------+----------+--------------+  CFV  Full            Yes       Yes                                    +----+---------------+---------+-----------+----------+--------------+     Summary: RIGHT: - There is no evidence of deep vein thrombosis in the lower extremity.  - No cystic structure found in the popliteal fossa.  LEFT: - No evidence of common femoral vein obstruction.  *See table(s) above for measurements and observations. Electronically signed by Coral Else MD on 06/05/2021 at 7:38:06 PM.    Final      Subjective: Pain controlled with toradol   Discharge Exam: Vitals:   06/10/21 1151 06/10/21 1337  BP: 114/80   Pulse: 92 (!) 159  Resp: 20   Temp: 98.4 F (36.9 C)   SpO2: 96%    Vitals:   06/10/21 0343 06/10/21 0802 06/10/21 1151 06/10/21 1337  BP: 111/68 116/68 114/80   Pulse: 95 86 92 (!) 159  Resp: Temp: 98.3 F (36.8 C) 98.2 F (36.8 C) 98.4 F (36.9 C)   TempSrc: Oral Oral Oral   SpO2: 95% 98% 96%   Weight:      Height:        General: Pt is alert, awake, not in acute  distress Cardiovascular: RRR, S1/S2 +,  no rubs, no gallops Respiratory: CTA bilaterally, no wheezing, no rhonchi Abdominal: Soft, NT, ND, bowel sounds + Extremities: no edema, no cyanosis    The results of significant diagnostics from this hospitalization (including imaging, microbiology, ancillary and laboratory) are listed below for reference.     Microbiology: Recent Results (from the past 240 hour(s))  Resp Panel by RT-PCR (Flu A&B, Covid) Nasopharyngeal Swab     Status: None   Collection Time: 06/02/21  4:50 AM   Specimen: Nasopharyngeal Swab; Nasopharyngeal(NP) swabs in vial transport medium  Result Value Ref Range Status   SARS Coronavirus 2 by RT PCR NEGATIVE NEGATIVE Final    Comment: (NOTE) SARS-CoV-2 target nucleic acids are NOT DETECTED.  The SARS-CoV-2 RNA is generally detectable in upper respiratory specimens during the acute phase of infection. The lowest concentration of SARS-CoV-2 viral copies this assay can detect is 138 copies/mL. A negative result does not preclude SARS-Cov-2 infection and should not be used as the sole basis for treatment or other patient management decisions. A negative result may occur with  improper specimen collection/handling, submission of specimen other than nasopharyngeal swab, presence of viral mutation(s) within the areas targeted by this assay, and inadequate number of viral copies(<138 copies/mL). A negative result must be combined with clinical observations, patient history, and epidemiological information. The expected result is Negative.  Fact Sheet for Patients:  BloggerCourse.com  Fact Sheet for Healthcare Providers:  SeriousBroker.it  This test is no t yet approved or cleared by the [place] FDA and  has been authorized for detection and/or diagnosis of SARS-CoV-2 by FDA under an Emergency Use Authorization (EUA). This EUA will remain  in effect (meaning this test can be used) for the duration of  the COVID-19 declaration under Section 564(b)(1) of the Act, 21 U.S.C.section 360bbb-3(b)(1), unless the authorization is terminated  or revoked sooner.       Influenza A by PCR NEGATIVE NEGATIVE Final   Influenza B by PCR NEGATIVE NEGATIVE Final    Comment: (NOTE) The Xpert Xpress SARS-CoV-2/FLU/RSV plus assay is intended as an aid in the diagnosis of influenza from Nasopharyngeal swab specimens and should not be used as a sole basis for treatment. Nasal washings and aspirates are unacceptable for Xpert Xpress SARS-CoV-2/FLU/RSV testing.  Fact Sheet for Patients: BloggerCourse.com  Fact Sheet for Healthcare Providers: SeriousBroker.it  This test is not yet approved or cleared by the [place] FDA and has been authorized for detection and/or diagnosis of SARS-CoV-2 by FDA under an Emergency Use Authorization (EUA). This EUA will remain in effect (meaning this test can be used) for the duration of the COVID-19 declaration under Section 564(b)(1) of the Act, 21 U.S.C. section 360bbb-3(b)(1), unless the authorization is terminated or revoked.  Performed at [facility], [address] [place], [place] [address]   Blood Culture (routine x 2)     Status: None   Collection Time: 06/02/21  4:50 AM   Specimen: Left Antecubital; Blood  Result Value Ref Range Status   Specimen Description   Final    LEFT ANTECUBITAL Performed at [facility], [address] [place], [place] [address]    Special Requests   Final    BOTTLES DRAWN AEROBIC AND ANAEROBIC Blood Culture adequate volume Performed at [facility], [address] [place], [place] [address]    Culture   Final    NO GROWTH 5 DAYS Performed at [facility], [address] [place], [place] [address]    Report Status 06/07/2021 FINAL  Final  Urine Culture     Status: Abnormal   Collection Time: 06/02/21  7:45 AM   Specimen:  In/Out Cath Urine  Result Value Ref Range Status   Specimen Description   Final    IN/OUT CATH URINE Performed at [facility], [address] [place], [place] [address]    Special Requests   Final    NONE Performed at [facility], [address] [place], [place] [address]    Culture MULTIPLE SPECIES PRESENT, SUGGEST RECOLLECTION (A)  Final   Report Status 06/03/2021 FINAL  Final  Body fluid culture w Gram Stain     Status: None   Collection Time: 06/02/21  3:01 PM   Specimen: Pleural Fluid  Result Value Ref Range Status   Specimen Description PLEURAL FLUID LEFT  Final   Special Requests NONE  Final   Gram Stain   Final    FEW WBC PRESENT, PREDOMINANTLY PMN NO ORGANISMS SEEN    Culture   Final    NO GROWTH Performed at [facility], [address] [place], [place] [address]    Report Status 06/08/2021 FINAL  Final  Fungus Culture With Stain     Status: None (Preliminary result)   Collection Time: 06/02/21  3:01 PM   Specimen: Pleural Fluid  Result Value Ref Range Status   Fungus Stain Final report  Final    Comment: (NOTE) Performed At: [facility] [address] [place], [place] [id number] Jolene Schimke MD [id number]    Fungus (Mycology) Culture PENDING  Incomplete   Fungal Source PLEURAL  Final    Comment: FLUID LEFT Performed at [facility], [address], [place], [place] [address]   Acid Fast Smear (AFB)     Status: None   Collection Time: 06/02/21  3:01 PM   Specimen: Pleural Fluid  Result Value Ref Range Status   AFB Specimen Processing Concentration  Final   Acid Fast Smear Negative  Final    Comment: (NOTE) Performed At: [facility] [address] [place], [place] [id number] Jolene Schimke MD [id number]    Source (AFB) PLEURAL  Final    Comment: FLUID LEFT Performed at [facility], [address] [place], [place] [address]   Fungus Culture Result     Status: None   Collection  Time: 06/02/21  3:01 PM  Result Value Ref Range Status   Result 1 Comment  Final    Comment: (NOTE) KOH/Calcofluor preparation:  no fungus observed. Performed At: [facility] [address] [place], [place] [id number] Jolene Schimke MD [id number]   Culture, blood (Routine X 2) w Reflex to ID Panel     Status: None   Collection Time: 06/03/21 12:21 PM   Specimen: BLOOD LEFT HAND  Result Value Ref Range Status   Specimen Description BLOOD LEFT HAND  Final   Special Requests   Final    BOTTLES DRAWN AEROBIC AND ANAEROBIC Blood Culture results may not be optimal due to an inadequate volume of blood received in culture bottles   Culture   Final    NO GROWTH 5 DAYS Performed at [facility], [address] [place], [place] [address]    Report Status 06/08/2021 FINAL  Final  Culture, blood (Routine X 2) w Reflex to ID Panel     Status: None  Collection Time: 06/03/21 12:31 PM   Specimen: BLOOD LEFT HAND  Result Value Ref Range Status   Specimen Description BLOOD LEFT HAND  Final   Special Requests   Final    BOTTLES DRAWN AEROBIC ONLY Blood Culture results may not be optimal due to an inadequate volume of blood received in culture bottles   Culture   Final    NO GROWTH 5 DAYS Performed at [facility], [address] [place], [place] [address]    Report Status 06/08/2021 FINAL  Final  Surgical PCR screen     Status: None   Collection Time: 06/05/21  9:49 PM   Specimen: Nasal Mucosa; Nasal Swab  Result Value Ref Range Status   MRSA, PCR NEGATIVE NEGATIVE Final   Staphylococcus aureus NEGATIVE NEGATIVE Final    Comment: (NOTE) The Xpert SA Assay (FDA approved for NASAL specimens in patients 58 years of age and older), is one component of a comprehensive surveillance program. It is not intended to diagnose infection nor to guide or monitor treatment. Performed at [facility], [address], [place], [place] [address]   Body fluid culture w Gram  Stain     Status: None   Collection Time: 06/06/21 11:38 AM   Specimen: Synovium; Synovial Fluid  Result Value Ref Range Status   Specimen Description SYNOVIAL FLUID  Final   Special Requests RIGHT ANKLE  Final   Gram Stain   Final    RARE WBC PRESENT, PREDOMINANTLY MONONUCLEAR NO ORGANISMS SEEN    Culture   Final    NO GROWTH 3 DAYS Performed at [facility], [address], [place], [place] [address]    Report Status 06/09/2021 FINAL  Final     Labs: BNP (last 3 results) No results for input(s): BNP in the last 8760 hours. Basic Metabolic Panel: Recent Labs  Lab 06/05/21 0428 06/05/21 2313 06/08/21 0125 06/09/21 0811 06/09/21 1810 06/10/21 0452  NA 139 134* 132* 135  --  135  K 3.7 3.9 4.8 4.2  --  4.0  CL 107 106 104 103  --  100  CO2 24 24 25 26   --  27  GLUCOSE 120* 135* 97 99  --  98  BUN 6 10 12 9   --  10  CREATININE 0.61 0.47 0.52 0.84 0.92 0.89  CALCIUM 8.0* 7.6* 8.2* 8.6*  --  8.8*  MG 2.0 1.8  --   --   --   --   PHOS 3.1 3.8  --   --   --   --    Liver Function Tests: Recent Labs  Lab 06/03/21 2340  AST 27  ALT QUANTITY NOT SUFFICIENT, UNABLE TO PERFORM TEST  ALKPHOS 69  BILITOT QUANTITY NOT SUFFICIENT, UNABLE TO PERFORM TEST  PROT 6.2*  ALBUMIN <1.5*   No results for input(s): LIPASE, AMYLASE in the last 168 hours. No results for input(s): AMMONIA in the last 168 hours. CBC: Recent Labs  Lab 06/03/21 2340 06/05/21 0428 06/05/21 2313 06/08/21 0125 06/10/21 1000  WBC 8.5 6.9 7.0 6.3 6.0  NEUTROABS 5.4 4.7  --   --  3.8  HGB 8.1* 8.5* 8.2* 8.9* 10.4*  HCT 26.1* 28.0* 27.4* 29.5* 34.3*  MCV 75.4* 77.1* 79.0* 78.9* 78.9*  PLT 420* 492* 413* 380 416*   Cardiac Enzymes: No results for input(s): CKTOTAL, CKMB, CKMBINDEX, TROPONINI in the last 168 hours. BNP: Invalid input(s): POCBNP CBG: No results for input(s): GLUCAP in the  last 168 hours. D-Dimer No results for input(s): DDIMER in the last 72 hours. Hgb A1c No results for  input(s): HGBA1C in the last 72 hours. Lipid Profile No results for input(s): CHOL, HDL, LDLCALC, TRIG, CHOLHDL, LDLDIRECT in the last 72 hours. Thyroid function studies No results for input(s): TSH, T4TOTAL, T3FREE, THYROIDAB in the last 72 hours.  Invalid input(s): FREET3 Anemia work up No results for input(s): VITAMINB12, FOLATE, FERRITIN, TIBC, IRON, RETICCTPCT in the last 72 hours. Urinalysis    Component Value Date/Time   COLORURINE AMBER (A) 06/02/2021 0745   APPEARANCEUR HAZY (A) 06/02/2021 0745   LABSPEC 1.020 06/02/2021 0745   PHURINE 5.5 06/02/2021 0745   GLUCOSEU NEGATIVE 06/02/2021 0745   HGBUR TRACE (A) 06/02/2021 0745   BILIRUBINUR MODERATE (A) 06/02/2021 0745   KETONESUR NEGATIVE 06/02/2021 0745   PROTEINUR 30 (A) 06/02/2021 0745   NITRITE POSITIVE (A) 06/02/2021 0745   LEUKOCYTESUR NEGATIVE 06/02/2021 0745   Sepsis Labs Invalid input(s): PROCALCITONIN,  WBC,  LACTICIDVEN Microbiology Recent Results (from the past 240 hour(s))  Resp Panel by RT-PCR (Flu A&B, Covid) Nasopharyngeal Swab     Status: None   Collection Time: 06/02/21  4:50 AM   Specimen: Nasopharyngeal Swab; Nasopharyngeal(NP) swabs in vial transport medium  Result Value Ref Range Status   SARS Coronavirus 2 by RT PCR NEGATIVE NEGATIVE Final    Comment: (NOTE) SARS-CoV-2 target nucleic acids are NOT DETECTED.  The SARS-CoV-2 RNA is generally detectable in upper respiratory specimens during the acute phase of infection. The lowest concentration of SARS-CoV-2 viral copies this assay can detect is 138 copies/mL. A negative result does not preclude SARS-Cov-2 infection and should not be used as the sole basis for treatment or other patient management decisions. A negative result may occur with  improper specimen collection/handling, submission of specimen other than nasopharyngeal swab, presence of viral mutation(s) within the areas targeted by this assay, and inadequate number of viral copies(<138  copies/mL). A negative result must be combined with clinical observations, patient history, and epidemiological information. The expected result is Negative.  Fact Sheet for Patients:  BloggerCourse.com  Fact Sheet for Healthcare Providers:  SeriousBroker.it  This test is no t yet approved or cleared by the [place] FDA and  has been authorized for detection and/or diagnosis of SARS-CoV-2 by FDA under an Emergency Use Authorization (EUA). This EUA will remain  in effect (meaning this test can be used) for the duration of the COVID-19 declaration under Section 564(b)(1) of the Act, 21 U.S.C.section 360bbb-3(b)(1), unless the authorization is terminated  or revoked sooner.       Influenza A by PCR NEGATIVE NEGATIVE Final   Influenza B by PCR NEGATIVE NEGATIVE Final    Comment: (NOTE) The Xpert Xpress SARS-CoV-2/FLU/RSV plus assay is intended as an aid in the diagnosis of influenza from Nasopharyngeal swab specimens and should not be used as a sole basis for treatment. Nasal washings and aspirates are unacceptable for Xpert Xpress SARS-CoV-2/FLU/RSV testing.  Fact Sheet for Patients: BloggerCourse.com  Fact Sheet for Healthcare Providers: SeriousBroker.it  This test is not yet approved or cleared by the [place] FDA and has been authorized for detection and/or diagnosis of SARS-CoV-2 by FDA under an Emergency Use Authorization (EUA). This EUA will remain in effect (meaning this test can be used) for the duration of the COVID-19 declaration under Section 564(b)(1) of the Act, 21 U.S.C. section 360bbb-3(b)(1), unless the authorization is terminated or revoked.  Performed at [facility], [address] [place], [place] [address]   Blood Culture (routine x 2)     Status: None   Collection Time: 06/02/21  4:50 AM   Specimen: Left Antecubital; Blood  Result  Value Ref Range Status   Specimen Description   Final    LEFT ANTECUBITAL Performed at [facility], [address] [place], [place] [address]    Special Requests   Final    BOTTLES DRAWN AEROBIC AND ANAEROBIC Blood Culture adequate volume Performed at [facility], [address] [place], [place] [address]    Culture   Final    NO GROWTH 5 DAYS Performed at [facility], [address] [place], [place] [address]    Report Status 06/07/2021 FINAL  Final  Urine Culture     Status: Abnormal   Collection Time: 06/02/21  7:45 AM   Specimen: In/Out Cath Urine  Result Value Ref Range Status   Specimen Description   Final    IN/OUT CATH URINE Performed at [facility], [address] [place], [place] [address]    Special Requests   Final    NONE Performed at [facility], [address] [place], [place] [address]    Culture MULTIPLE SPECIES PRESENT, SUGGEST RECOLLECTION (A)  Final   Report Status 06/03/2021 FINAL  Final  Body fluid culture w Gram Stain     Status: None   Collection Time: 06/02/21  3:01 PM   Specimen: Pleural Fluid  Result Value Ref Range Status   Specimen Description PLEURAL FLUID LEFT  Final   Special Requests NONE  Final   Gram Stain   Final    FEW WBC PRESENT, PREDOMINANTLY PMN NO ORGANISMS SEEN    Culture   Final    NO GROWTH Performed at [facility], [address] [place], [place] [address]    Report Status 06/08/2021 FINAL  Final  Fungus Culture With Stain     Status: None (Preliminary result)   Collection Time: 06/02/21  3:01 PM   Specimen: Pleural Fluid  Result Value Ref Range Status   Fungus Stain Final report  Final    Comment: (NOTE) Performed At: [facility] [address] [place], [place] [id number] Jolene Schimke MD [id number]    Fungus (Mycology) Culture PENDING  Incomplete   Fungal Source PLEURAL  Final    Comment: FLUID LEFT Performed at [facility], [address] [place], [place] [address]   Acid Fast Smear (AFB)     Status: None   Collection Time: 06/02/21  3:01 PM   Specimen: Pleural Fluid  Result Value Ref Range Status   AFB Specimen Processing Concentration  Final   Acid Fast Smear Negative  Final    Comment: (NOTE) Performed At: [facility] [address] [place], [place] [id number] Jolene Schimke MD [id number]    Source (AFB) PLEURAL  Final    Comment: FLUID LEFT Performed at [facility], [address], [place], [place] [address]   Fungus Culture Result     Status: None   Collection Time: 06/02/21  3:01 PM  Result Value Ref Range Status   Result 1 Comment  Final    Comment: (NOTE) KOH/Calcofluor preparation:  no fungus observed. Performed At: [facility] [address] [place], [place] [id number] Jolene Schimke MD [id number]:[id number]   Culture, blood (Routine X 2) w Reflex to ID Panel  Status: None   Collection Time: 06/03/21 12:21 PM   Specimen: BLOOD LEFT HAND  Result Value Ref Range Status   Specimen Description BLOOD LEFT HAND  Final   Special Requests   Final    BOTTLES DRAWN AEROBIC AND ANAEROBIC Blood Culture results may not be optimal due to an inadequate volume of blood received in culture bottles   Culture   Final    NO GROWTH 5 DAYS Performed at [facility], [address], [place], [place] [address]    Report Status 06/08/2021 FINAL  Final  Culture, blood (Routine X 2) w Reflex to ID Panel     Status: None   Collection Time: 06/03/21 12:31 PM   Specimen: BLOOD LEFT HAND  Result Value Ref Range Status   Specimen Description BLOOD LEFT HAND  Final   Special Requests   Final    BOTTLES DRAWN AEROBIC ONLY Blood Culture results may not be optimal due to an inadequate volume of blood received in culture bottles   Culture   Final    NO GROWTH 5 DAYS Performed at [facility], [address], [place], [place] [address]    Report Status 06/08/2021 FINAL  Final   Surgical PCR screen     Status: None   Collection Time: 06/05/21  9:49 PM   Specimen: Nasal Mucosa; Nasal Swab  Result Value Ref Range Status   MRSA, PCR NEGATIVE NEGATIVE Final   Staphylococcus aureus NEGATIVE NEGATIVE Final    Comment: (NOTE) The Xpert SA Assay (FDA approved for NASAL specimens in patients 7 years of age and older), is one component of a comprehensive surveillance program. It is not intended to diagnose infection nor to guide or monitor treatment. Performed at [facility], [address] [place], [place] [address]   Body fluid culture w Gram Stain     Status: None   Collection Time: 06/06/21 11:38 AM   Specimen: Synovium; Synovial Fluid  Result Value Ref Range Status   Specimen Description SYNOVIAL FLUID  Final   Special Requests RIGHT ANKLE  Final   Gram Stain   Final    RARE WBC PRESENT, PREDOMINANTLY MONONUCLEAR NO ORGANISMS SEEN    Culture   Final    NO GROWTH 3 DAYS Performed at [facility], [address] [place], [place] [address]    Report Status 06/09/2021 FINAL  Final     Time coordinating discharge: 36 MINUTES.  SIGNED:   Kathlen Mody, MD  [facility] 06/10/2021, 4:29 PM

## 2021-06-10 NOTE — Progress Notes (Signed)
NAME:  Sharon [name], MRN:  [id number], DOB:  10/09/1990, LOS: 8 ADMISSION DATE:  06/02/2021, CONSULTATION DATE:  06/02/2021 REFERRING MD:  Dr. 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  Found to have abscess on Lt leg.  She had chest xray that showed large left pleural effusion.  CT chest showed area of cavitation in the inferior lingula and large Lt sided pleural effusion.  PCCM asked to assess for drainage of pleural effusion.  Pertinent  Medical History  Anxiety, OA, Carpal tunnel, IVDA, Headache  Significant Hospital Events: Including procedures, antibiotic start and stop dates in addition to other pertinent events   12/08 Admit, ortho consulted, start ABx, Lt pig tail catheter placed, start tPA/dornase  Studies:  CT angio chest 06/02/21 >> large complex Lt effusion with pleural enhancement with partial loculation, ATX LLL, cavitary area lingula, splenomegaly Lt pleural fluid 06/02/21 >> glucose less than 20, protein 5.4, LDH 4696, 10625 cells (92% neutrophils) MRI12/02/2021 1. Rim-enhancing abscess within the medial subcutaneous soft tissues at the level of the tibial plateau measuring 3.6 x 2.0 x 2.9 cm. 2. Mild diffuse intramuscular edema involving the anterior and deep posterior compartments of the lower leg with associated perifascial edema, compatible with myofasciitis. No evidence of myonecrosis or pyomyositis. 3. Mild tenosynovitis associated with the posteromedial ankle tendons, which may be reactive or infectious. mri No evidence of osteomyelitis. tPA started 06/02/2021 with repeat doses given on 12/9 and 12/11.  12/13 CT chest > most of the left sided fluid has drained but there is still a sizeable collection in the left upper/medial pleural space which has not drained, chest tube in place  Interim History / Subjective:  No events. Breathing well  on RA.  Objective   Blood pressure 116/68, pulse 86, temperature 98.2 F (36.8 C), temperature source Oral, resp. rate 16, height 5\' 3"  (1.6 m), weight 85.7 kg, last menstrual period 05/13/2021, SpO2 98 %.        Intake/Output Summary (Last 24 hours) at 06/10/2021 0951 Last data filed at 06/10/2021 0345 Gross per 24 hour  Intake 240 ml  Output 650 ml  Net -410 ml    Filed Weights   06/02/21 0036 06/02/21 0456 06/02/21 1331  Weight: 89.8 kg 86.4 kg 85.7 kg    Examination:  No distress Lungs diminished Moves all 4 ext R leg markedly swollen   Resolved Hospital Problem list     Assessment & Plan:   Lt pleural space empyema with cavitary pneumonia in setting of IVDA and Leg abscess and cellulitis.  Some residual fluid on CT but not enough to affect breathing; would just try to sterilize space and allow it to scar down with prolonged abx.  Will arrange f/u in 8 weeks in pulmonary clinic with CXR (she has no PCP)  PCCM available PRN.  Best Practice (right click and "Reselect all SmartList Selections" daily)   Per primary Labs    CMP Latest Ref Rng & Units 06/10/2021 06/09/2021 06/09/2021  Glucose 70 - 99 mg/dL 98 - 99  BUN 6 - 20 mg/dL 10 - 9  Creatinine 06/11/2021 - 1.00 mg/dL 9.32 6.71 2.45  Sodium 135 - 145 mmol/L 135 - 135  Potassium 3.5 - 5.1 mmol/L 4.0 - 4.2  Chloride 98 - 111 mmol/L 100 - 103  CO2 22 - 32 mmol/L 27 - 26  Calcium  8.9 - 10.3 mg/dL 8.8(L) - 8.6(L)  Total Protein 6.5 - 8.1 g/dL - - -  Total Bilirubin 0.3 - 1.2 mg/dL - - -  Alkaline Phos 38 - 126 U/L - - -  AST 15 - 41 U/L - - -  ALT 0 - 44 U/L - - -    CBC Latest Ref Rng & Units 06/08/2021 06/05/2021 06/05/2021  WBC 4.0 - 10.5 K/uL 6.3 7.0 6.9  Hemoglobin 12.0 - 15.0 g/dL 8.9(L) 8.2(L) 8.5(L)  Hematocrit 36.0 - 46.0 % 29.5(L) 27.4(L) 28.0(L)  Platelets 150 - 400 K/uL 380 413(H) 492(H)   Erskine Emery MD [facility]  Prefer epic messenger for cross cover needs If after  hours, please call E-link

## 2021-06-14 ENCOUNTER — Telehealth: Payer: Self-pay

## 2021-06-14 NOTE — Telephone Encounter (Signed)
ATC patient to get her scheduled for appt line rang and then got busy signal. Looks like Dr. Thora Lance is here the week of Jan 23rd and the week of Jan 30th

## 2021-06-14 NOTE — Telephone Encounter (Signed)
-----   Message from Lorin Glass, MD sent at 06/10/2021  9:56 AM EST ----- Regarding: 6 week f/u NM With CXR, may be a no show IVDA, empyema, f/u to assure resolution

## 2021-06-23 NOTE — Telephone Encounter (Signed)
ATC patient to get her scheduled for appt line rang and then got busy signal, ATC mobile number and was able to leave a message asking patient to call back to schedule an appointment.

## 2021-07-05 LAB — FUNGUS CULTURE WITH STAIN

## 2021-07-05 LAB — FUNGUS CULTURE RESULT

## 2021-07-05 LAB — FUNGAL ORGANISM REFLEX

## 2021-07-08 ENCOUNTER — Ambulatory Visit: Payer: Medicaid Other | Admitting: Internal Medicine

## 2021-07-09 NOTE — Progress Notes (Signed)
Erroneous encounter

## 2021-07-12 ENCOUNTER — Encounter: Payer: Medicaid Other | Admitting: Family

## 2021-07-12 DIAGNOSIS — Z7689 Persons encountering health services in other specified circumstances: Secondary | ICD-10-CM

## 2021-07-12 DIAGNOSIS — Z720 Tobacco use: Secondary | ICD-10-CM

## 2021-07-12 DIAGNOSIS — L03115 Cellulitis of right lower limb: Secondary | ICD-10-CM

## 2021-07-12 DIAGNOSIS — L02416 Cutaneous abscess of left lower limb: Secondary | ICD-10-CM

## 2021-07-12 DIAGNOSIS — A419 Sepsis, unspecified organism: Secondary | ICD-10-CM

## 2021-07-12 DIAGNOSIS — J189 Pneumonia, unspecified organism: Secondary | ICD-10-CM

## 2021-07-12 DIAGNOSIS — E876 Hypokalemia: Secondary | ICD-10-CM

## 2021-07-12 DIAGNOSIS — F1190 Opioid use, unspecified, uncomplicated: Secondary | ICD-10-CM

## 2021-07-12 DIAGNOSIS — Z09 Encounter for follow-up examination after completed treatment for conditions other than malignant neoplasm: Secondary | ICD-10-CM

## 2021-07-12 DIAGNOSIS — J869 Pyothorax without fistula: Secondary | ICD-10-CM

## 2021-07-12 DIAGNOSIS — L02415 Cutaneous abscess of right lower limb: Secondary | ICD-10-CM

## 2021-07-12 DIAGNOSIS — D509 Iron deficiency anemia, unspecified: Secondary | ICD-10-CM

## 2021-07-12 DIAGNOSIS — F1990 Other psychoactive substance use, unspecified, uncomplicated: Secondary | ICD-10-CM

## 2021-07-22 LAB — ACID FAST CULTURE WITH REFLEXED SENSITIVITIES (MYCOBACTERIA): Acid Fast Culture: NEGATIVE
